# Patient Record
Sex: Female | Born: 1970 | Race: White | Hispanic: No | Marital: Married | Smoking: Former smoker
Health system: Southern US, Community
[De-identification: ages and names within clinical notes are randomized; demographics above are authoritative.]

## PROBLEM LIST (undated history)

## (undated) DIAGNOSIS — Z9889 Other specified postprocedural states: Secondary | ICD-10-CM

## (undated) DIAGNOSIS — L900 Lichen sclerosus et atrophicus: Secondary | ICD-10-CM

## (undated) DIAGNOSIS — F329 Major depressive disorder, single episode, unspecified: Secondary | ICD-10-CM

## (undated) DIAGNOSIS — K589 Irritable bowel syndrome without diarrhea: Secondary | ICD-10-CM

## (undated) DIAGNOSIS — F419 Anxiety disorder, unspecified: Secondary | ICD-10-CM

## (undated) DIAGNOSIS — F32A Depression, unspecified: Secondary | ICD-10-CM

## (undated) DIAGNOSIS — K219 Gastro-esophageal reflux disease without esophagitis: Secondary | ICD-10-CM

## (undated) HISTORY — DX: Lichen sclerosus et atrophicus: L90.0

## (undated) HISTORY — DX: Anxiety disorder, unspecified: F41.9

## (undated) HISTORY — PX: DILATION AND CURETTAGE OF UTERUS: SHX78

## (undated) HISTORY — DX: Irritable bowel syndrome, unspecified: K58.9

## (undated) HISTORY — DX: Major depressive disorder, single episode, unspecified: F32.9

## (undated) HISTORY — DX: Depression, unspecified: F32.A

---
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
## 1998-04-02 ENCOUNTER — Other Ambulatory Visit: Admission: RE | Admit: 1998-04-02 | Discharge: 1998-04-02 | Payer: Self-pay | Admitting: Gynecology

## 1999-04-28 ENCOUNTER — Other Ambulatory Visit: Admission: RE | Admit: 1999-04-28 | Discharge: 1999-04-28 | Payer: Self-pay | Admitting: Gynecology

## 2001-08-24 ENCOUNTER — Encounter: Payer: Self-pay | Admitting: Obstetrics and Gynecology

## 2001-08-24 ENCOUNTER — Inpatient Hospital Stay (HOSPITAL_COMMUNITY): Admission: AD | Admit: 2001-08-24 | Discharge: 2001-08-24 | Payer: Self-pay | Admitting: Obstetrics and Gynecology

## 2001-09-18 ENCOUNTER — Other Ambulatory Visit: Admission: RE | Admit: 2001-09-18 | Discharge: 2001-09-18 | Payer: Self-pay | Admitting: Obstetrics and Gynecology

## 2002-03-26 ENCOUNTER — Encounter (INDEPENDENT_AMBULATORY_CARE_PROVIDER_SITE_OTHER): Payer: Self-pay | Admitting: *Deleted

## 2002-03-26 ENCOUNTER — Inpatient Hospital Stay (HOSPITAL_COMMUNITY): Admission: AD | Admit: 2002-03-26 | Discharge: 2002-03-29 | Payer: Self-pay | Admitting: Obstetrics and Gynecology

## 2002-03-26 ENCOUNTER — Encounter: Payer: Self-pay | Admitting: Obstetrics and Gynecology

## 2002-03-30 ENCOUNTER — Encounter: Admission: RE | Admit: 2002-03-30 | Discharge: 2002-04-29 | Payer: Self-pay | Admitting: Obstetrics and Gynecology

## 2002-04-30 ENCOUNTER — Encounter: Admission: RE | Admit: 2002-04-30 | Discharge: 2002-05-30 | Payer: Self-pay | Admitting: Obstetrics and Gynecology

## 2002-05-03 ENCOUNTER — Other Ambulatory Visit: Admission: RE | Admit: 2002-05-03 | Discharge: 2002-05-03 | Payer: Self-pay | Admitting: Obstetrics and Gynecology

## 2003-05-09 ENCOUNTER — Other Ambulatory Visit: Admission: RE | Admit: 2003-05-09 | Discharge: 2003-05-09 | Payer: Self-pay | Admitting: Obstetrics and Gynecology

## 2004-11-02 ENCOUNTER — Other Ambulatory Visit: Admission: RE | Admit: 2004-11-02 | Discharge: 2004-11-02 | Payer: Self-pay | Admitting: Obstetrics and Gynecology

## 2006-09-14 ENCOUNTER — Encounter: Admission: RE | Admit: 2006-09-14 | Discharge: 2006-09-14 | Payer: Self-pay | Admitting: Family Medicine

## 2006-09-23 ENCOUNTER — Encounter: Admission: RE | Admit: 2006-09-23 | Discharge: 2006-09-23 | Payer: Self-pay | Admitting: Family Medicine

## 2008-07-11 ENCOUNTER — Other Ambulatory Visit: Admission: RE | Admit: 2008-07-11 | Discharge: 2008-07-11 | Payer: Self-pay | Admitting: Obstetrics & Gynecology

## 2008-10-11 ENCOUNTER — Ambulatory Visit (HOSPITAL_COMMUNITY): Admission: RE | Admit: 2008-10-11 | Discharge: 2008-10-11 | Payer: Self-pay | Admitting: Obstetrics & Gynecology

## 2008-10-11 ENCOUNTER — Encounter: Payer: Self-pay | Admitting: Obstetrics & Gynecology

## 2009-01-17 ENCOUNTER — Encounter: Admission: RE | Admit: 2009-01-17 | Discharge: 2009-01-17 | Payer: Self-pay | Admitting: Obstetrics & Gynecology

## 2009-01-17 IMAGING — MG MM SCREEN MAMMOGRAM BILATERAL
4 series · 4 of 4 positions shown · non-contrast
Comparison: none

DG SCREEN MAMMOGRAM BILATERAL
Bilateral CC and MLO view(s) were taken.

DIGITAL SCREENING MAMMOGRAM WITH CAD:
The breast tissue is heterogeneously dense.  No masses or malignant type calcifications are 
identified.  Compared with prior studies.
Images were processed with CAD.

[R CC]
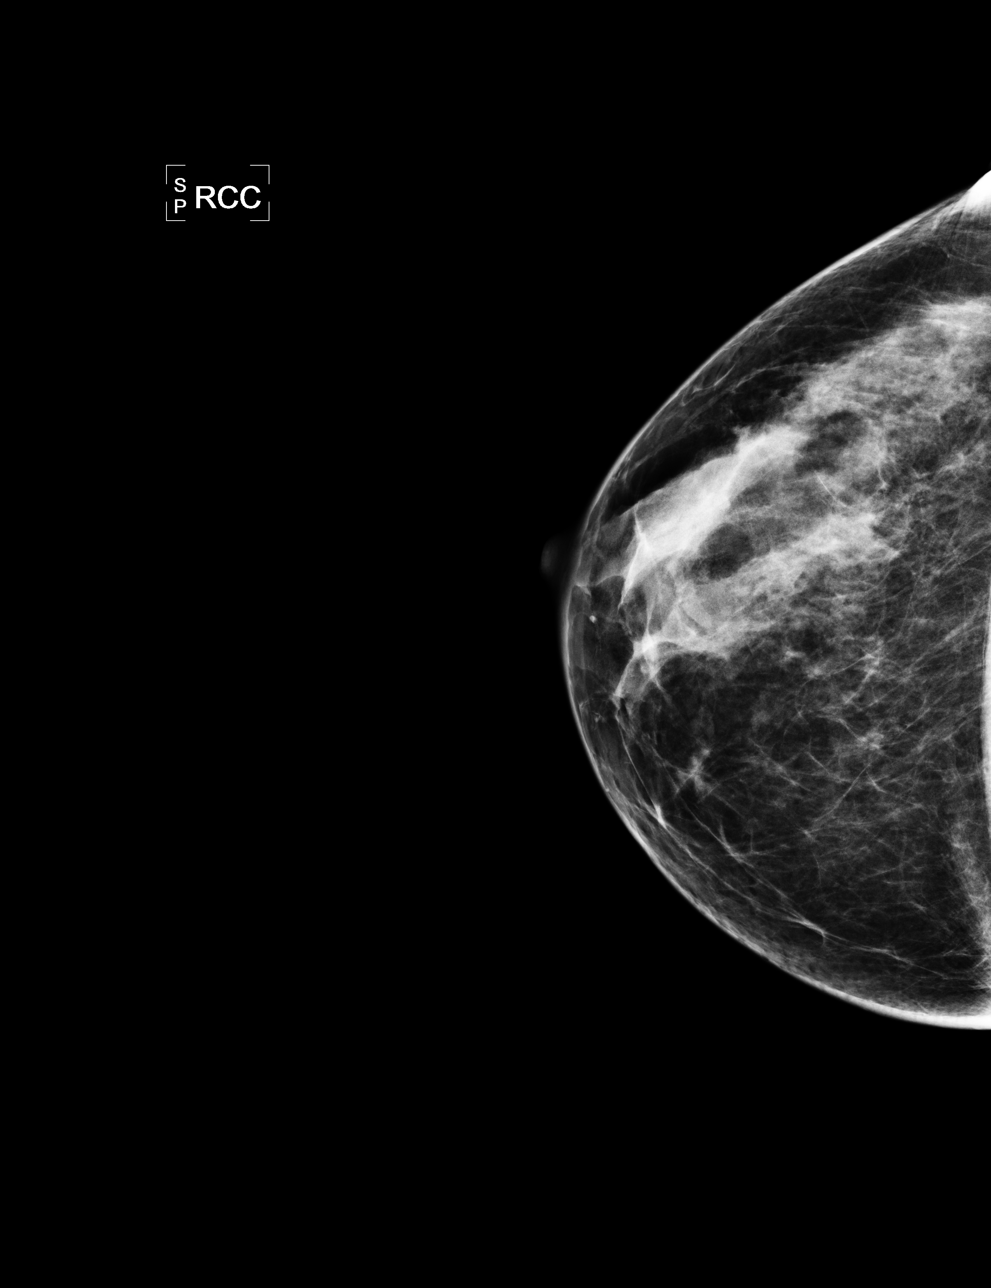

[L CC]
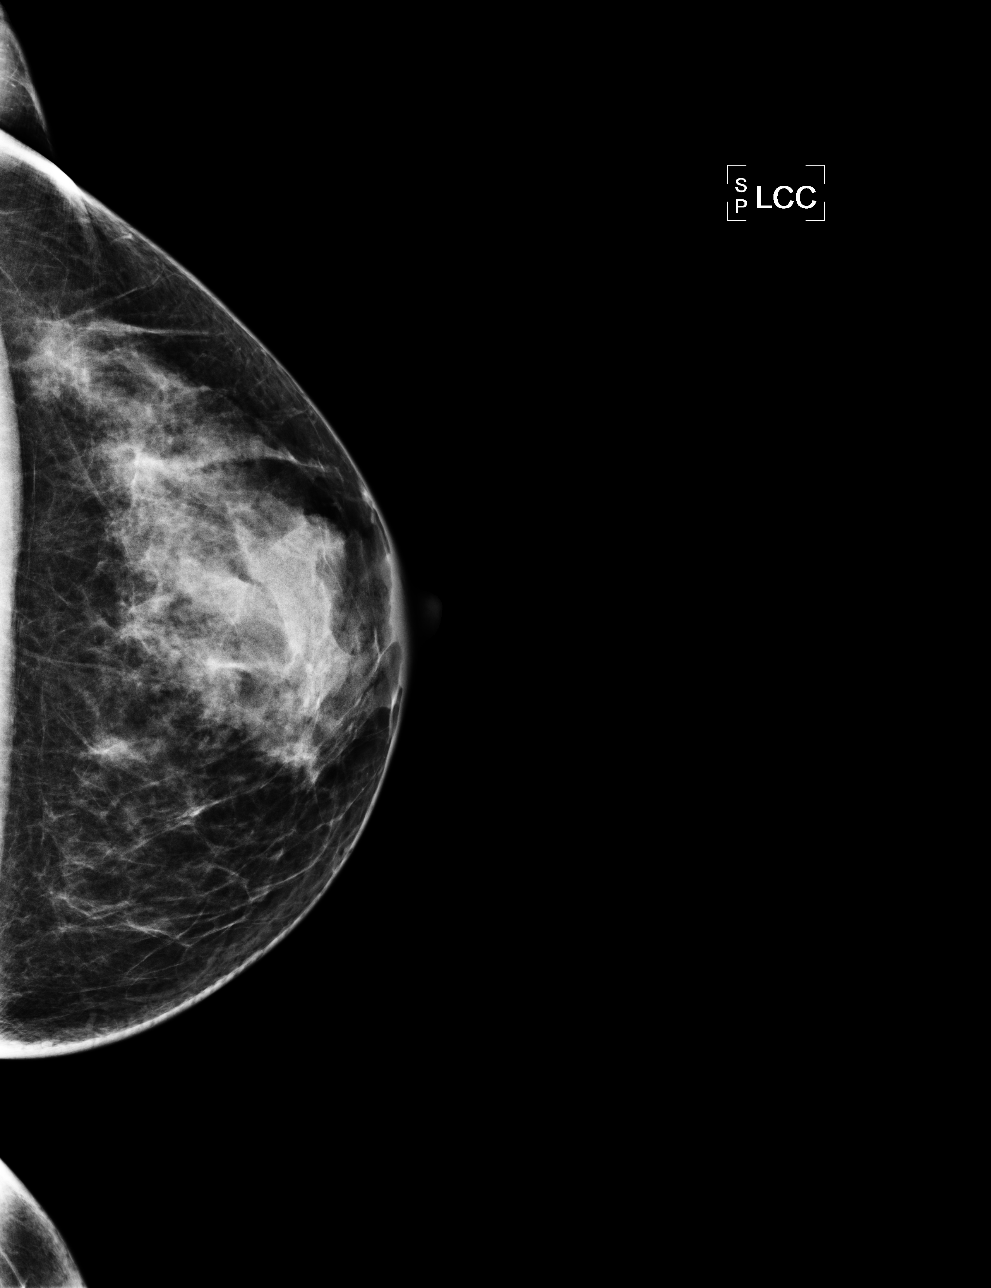

[L MLO]
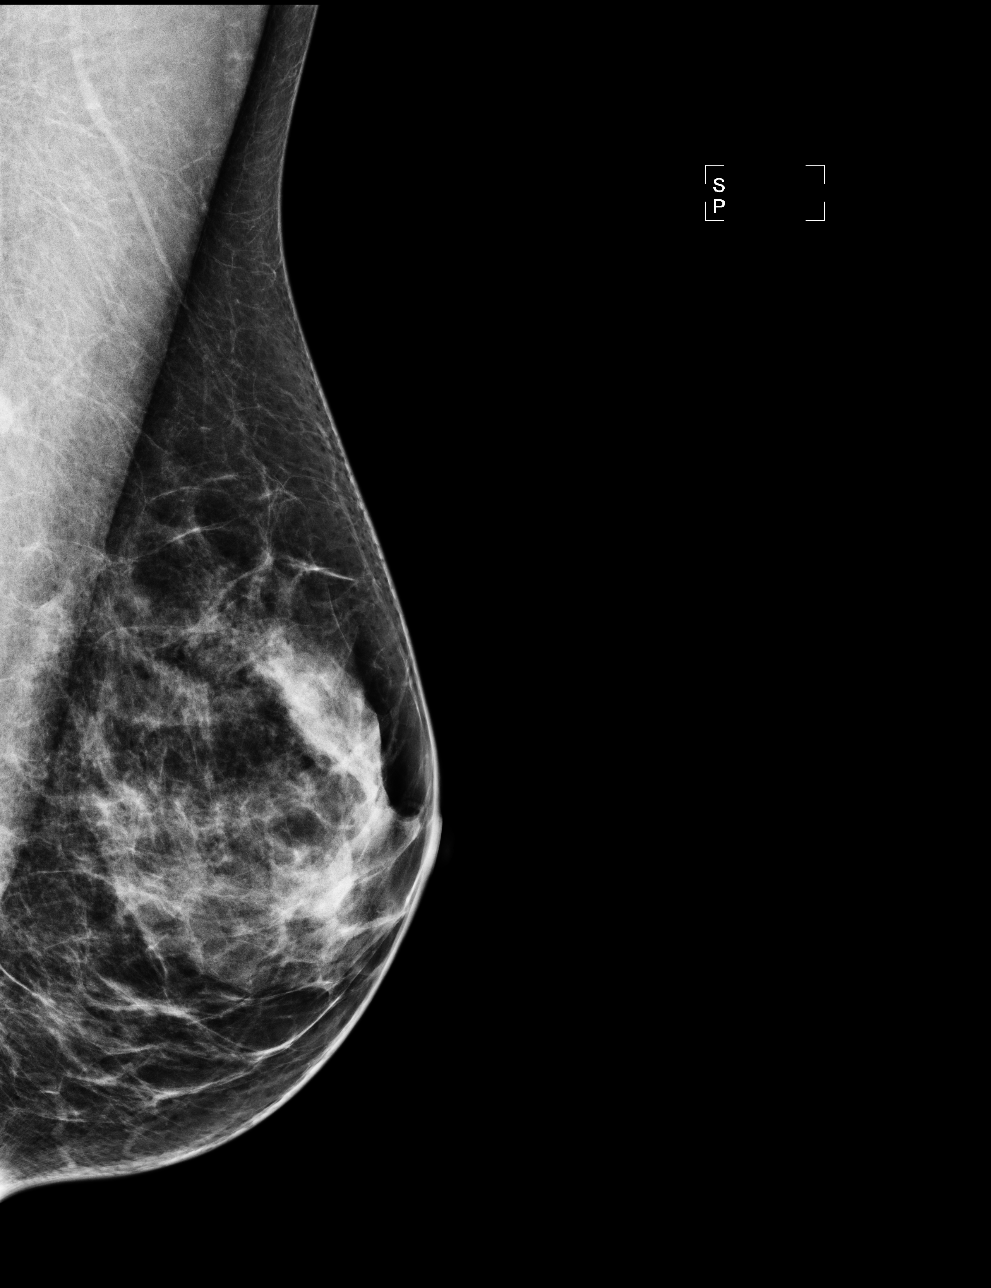

[R MLO]
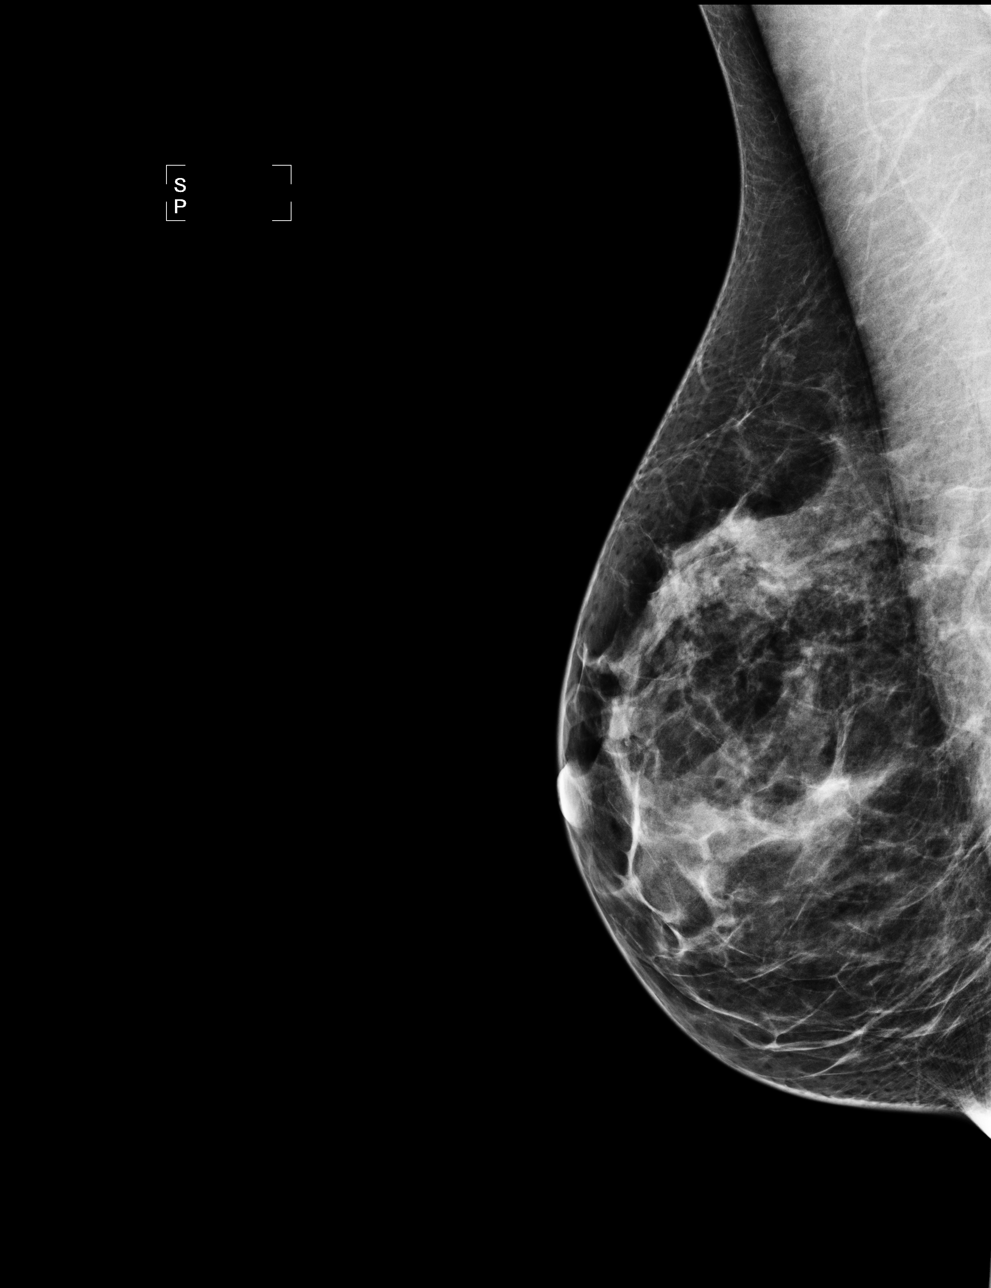

[4 of 4 positions shown; findings below may reference images not displayed]

IMPRESSION: No specific mammographic evidence of malignancy.  Next screening mammogram is recommended in one 
year.

A result letter of this screening mammogram will be mailed directly to the patient.

ASSESSMENT: Benign - BI-RADS 2

Screening mammogram in 1 year.
,

## 2010-01-28 ENCOUNTER — Encounter: Admission: RE | Admit: 2010-01-28 | Discharge: 2010-01-28 | Payer: Self-pay | Admitting: Obstetrics & Gynecology

## 2010-04-17 ENCOUNTER — Ambulatory Visit: Payer: Self-pay | Admitting: Family Medicine

## 2010-04-17 DIAGNOSIS — R319 Hematuria, unspecified: Secondary | ICD-10-CM | POA: Insufficient documentation

## 2010-04-17 DIAGNOSIS — R6881 Early satiety: Secondary | ICD-10-CM | POA: Insufficient documentation

## 2010-04-17 DIAGNOSIS — R143 Flatulence: Secondary | ICD-10-CM

## 2010-04-17 DIAGNOSIS — R141 Gas pain: Secondary | ICD-10-CM | POA: Insufficient documentation

## 2010-04-17 DIAGNOSIS — R142 Eructation: Secondary | ICD-10-CM

## 2010-04-17 DIAGNOSIS — F339 Major depressive disorder, recurrent, unspecified: Secondary | ICD-10-CM | POA: Insufficient documentation

## 2010-04-17 LAB — CONVERTED CEMR LAB
Ketones, urine, test strip: NEGATIVE
Nitrite: NEGATIVE
Protein, U semiquant: NEGATIVE
Urobilinogen, UA: 0.2

## 2010-04-20 LAB — CONVERTED CEMR LAB
ALT: 17 units/L (ref 0–35)
AST: 21 units/L (ref 0–37)
Alkaline Phosphatase: 53 units/L (ref 39–117)
BUN: 13 mg/dL (ref 6–23)
Basophils Absolute: 0 10*3/uL (ref 0.0–0.1)
Bilirubin, Direct: 0.2 mg/dL (ref 0.0–0.3)
CA 125: 8 units/mL (ref 0.0–30.2)
Creatinine, Ser: 0.8 mg/dL (ref 0.4–1.2)
Eosinophils Relative: 1.6 % (ref 0.0–5.0)
GFR calc non Af Amer: 87.51 mL/min (ref >60)
Glucose, Bld: 74 mg/dL (ref 70–99)
Monocytes Absolute: 0.5 10*3/uL (ref 0.1–1.0)
Monocytes Relative: 6.8 % (ref 3.0–12.0)
Neutrophils Relative %: 65.3 % (ref 43.0–77.0)
Platelets: 184 10*3/uL (ref 150.0–400.0)
RDW: 12.9 % (ref 11.5–14.6)
Total Bilirubin: 1.2 mg/dL (ref 0.3–1.2)
Total Protein: 6.7 g/dL (ref 6.0–8.3)
WBC: 6.8 10*3/uL (ref 4.5–10.5)

## 2010-05-07 ENCOUNTER — Ambulatory Visit: Payer: Self-pay | Admitting: Family Medicine

## 2010-05-07 DIAGNOSIS — K649 Unspecified hemorrhoids: Secondary | ICD-10-CM | POA: Insufficient documentation

## 2010-06-23 ENCOUNTER — Other Ambulatory Visit: Payer: Self-pay | Admitting: Dermatology

## 2010-07-02 NOTE — Assessment & Plan Note (Signed)
Summary: new to estab/cbs   Vital Signs:  Patient profile:   40 year old female Height:      63.25 inches Weight:      126 pounds BMI:     22.22 Pulse rate:   108 / minute BP sitting:   110 / 66  (left arm)  Vitals Entered By: Doristine Devoid CMA (April 17, 2010 10:32 AM) CC: NEW EST- urinary urgency was seen at [facility] for same issue and was found to have trace of blood in urine but no other recommendations where made    History of Present Illness: 40 yo woman here today to establish care.  previous PCP- [facility].  GYNHyacinth Meeker.  hematuria- UA had trace blood 1 month ago but urine cx was (-).  at the time was having flank pain and was told she had a lung infxn and started on abx.  2 weeks later was told it was not lung infxn.  still having urinary frequency, urgency, and hesitancy.  no dysuria.  large caffeine intake.  early satiety.  sxs started 2-3 months ago.  + gas and bloating.  most often occurs at night.  no N/V.  no diarrhea.  reports normal BMs.  regular menstrual cycles.  reports increased vaginal discharge- 'egg white' appearing.  no odor or itching/burning.  Preventive Screening-Counseling & Management  Alcohol-Tobacco     Smoking Status: quit < 6 months     Year Quit: 2011  Current Medications (verified): 1)  Wellbutrin Xl 300 Mg Xr24h-Tab (Bupropion Hcl) .... 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  recently quit.Smoking Status:  quit < 6 months  Review of Systems      See HPI  Physical Exam  General:  Well-developed,well-nourished,in no acute distress; alert,appropriate and cooperative throughout examination Head:  Normocephalic and  atraumatic without obvious abnormalities. No apparent alopecia or balding. Neck:  No deformities, masses, or tenderness noted. Lungs:  Normal respiratory effort, chest expands symmetrically. Lungs are clear to auscultation, no crackles or wheezes. Heart:  Normal rate and regular rhythm. S1 and S2 normal without gallop, murmur, click, rub or other extra sounds. Abdomen:  Bowel sounds positive,abdomen soft and non-tender without masses, organomegaly or hernias noted. Pulses:  +2 carotid, radial, DP Extremities:  no C/C/E Psych:  mildly anxious   Impression & Recommendations:  Problem # 1:  HEMATURIA UNSPECIFIED (ICD-599.70) Assessment New pt again w/ trace blood in urine.  send for cx but hold on abx given lack of other abnormalities.  if no infxn present will refer to urology. Orders: UA Dipstick w/o Micro (manual) (16109) Specimen Handling (99000) T-Culture, Urine ([id number])  Problem # 2:  GAS/BLOATING (ICD-787.3) Assessment: New check H pylori to r/o ulcer.  add PPI (omeprazole).  may be stress related as sxs coincided w/ loss of her father.  will follow. Orders: TLB-H. Pylori Abs(Helicobacter Pylori) (86677-HELICO) TLB-Hepatic/Liver Function Pnl (80076-HEPATIC) Specimen Handling (91478)  Problem # 3:  EARLY SATIETY (ICD-780.94) Assessment: New again may be stress related but will check CA-125 to r/o possible GYN cause of sxs.  CBC to r/o infxn.  will follow closely. Orders: TLB-BMP (Basic Metabolic Panel-BMET) (80048-METABOL) TLB-CBC Platelet - w/Differential (85025-CBCD) T-CA 125 (29562-13086)  Complete Medication List:  1)  Wellbutrin Xl 300 Mg Xr24h-tab (Bupropion hcl) .... Take one tablet daily  Patient Instructions: 1)  Follow up in 3-4 weeks to discuss gas/bloating- sooner if needed 2)  We'll notify you of your lab results 3)  Start Prilosec (Omeprazole) daily for possible acid reflux 4)  Increase your water intake- goal is 1 bottle in the morning and 1 in the  afternoon 5)  If no infection in your urine will refer to urology for complete evaluation 6)  Call with any questions or concerns 7)  Hang in there! 8)  Welcome!  We're glad to have you! 9)  Happy Holidays!   Orders Added: 1)  UA Dipstick w/o Micro (manual) [81002] 2)  Specimen Handling [99000] 3)  T-Culture, Urine [[id number]] 4)  TLB-H. Pylori Abs(Helicobacter Pylori) [86677-HELICO] 5)  TLB-Hepatic/Liver Function Pnl [80076-HEPATIC] 6)  TLB-BMP (Basic Metabolic Panel-BMET) [80048-METABOL] 7)  TLB-CBC Platelet - w/Differential [85025-CBCD] 8)  T-CA 125 [86304-23750] 9)  Specimen Handling [99000] 10)  New Patient Level III [61950]    Laboratory Results   Urine Tests    Routine Urinalysis   Glucose: negative   (Normal Range: Negative) Bilirubin: negative   (Normal Range: Negative) Ketone: negative   (Normal Range: Negative) Spec. Gravity: >=1.030   (Normal Range: 1.003-1.035) Blood: moderate   (Normal Range: Negative) pH: 6.0   (Normal Range: 5.0-8.0) Protein: negative   (Normal Range: Negative) Urobilinogen: 0.2   (Normal Range: 0-1) Nitrite: negative   (Normal Range: Negative) Leukocyte Esterace: negative   (Normal Range: Negative)         Preventive Care Screening  Mammogram:    Date:  12/29/2009    Results:  normal   Pap Smear:    Date:  10/29/2009    Results:  normal

## 2010-07-02 NOTE — Assessment & Plan Note (Signed)
Summary: followup from few weeks ago///sph   Vital Signs:  Patient profile:   40 year old female Weight:      126 pounds Pulse rate:   84 / minute BP sitting:   114 / 70  (left arm)  Vitals Entered By: Doristine Devoid CMA (May 07, 2010 1:06 PM) CC: f/u up on med and blood in stool    History of Present Illness: 40 yo woman here today for f/u on  1) depression- feels mood has improved.  taking Welbutrin daily.  having some difficulty w/ approaching holidays.  still has a lot of stress in life w/ work, kids, home, family.  2) gas/bloating- sxs have improved since limiting caffeine intake and increasing water.  prilosec also helping.  no abd pain.  3) blood in stool- was straining to have BM, had large amount of BRB.  2 episodes yesterday and 1 today.  pain w/ BMs.  no hx of hemorrhoids.  started stool softener last night.  stools are not dark or tarry, no blood mixed through stool.  Current Medications (verified): 1)  Wellbutrin Xl 300 Mg Xr24h-Tab (Bupropion Hcl) .... Take One Tablet Daily  Allergies (verified): No Known Drug Allergies  Past History:  Past medical, surgical, family and social histories (including risk factors) reviewed for relevance to current acute and chronic problems.  Past Medical History: Reviewed history from 04/17/2010 and no changes required. Depression  Past Surgical History: Reviewed history from 04/17/2010 and no changes required. Caesarean section Dilation and currettage  Family History: Reviewed history from 04/17/2010 and no changes required. CAD-no HTN-no DM-no STROKE-father (died 02-22-2023) COLON CA-no BREAST CA-no  Social History: Reviewed history from 04/17/2010 and no changes required. 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  Review of Systems      See HPI  Physical Exam  General:  Well-developed,well-nourished,in no acute distress; alert,appropriate and cooperative throughout  examination Lungs:  Normal respiratory effort, chest expands symmetrically. Lungs are clear to auscultation, no crackles or wheezes. Heart:  Normal rate and regular rhythm. S1 and S2 normal without gallop, murmur, click, rub or other extra sounds. Abdomen:  Bowel sounds positive,abdomen soft and non-tender without masses, organomegaly or hernias noted. Rectal:  no external abnormalities, normal sphincter tone, and mildly tender, small internal hemorrhoid.  + gross blood     Impression & Recommendations:  Problem # 1:  DEPRESSION (ICD-311) Assessment Improved pt feels mood is improving w/ continued use of Welbutrin.  still having difficulty at time accepting dad is not here for holidays but reports 'overall, i'm much better'. Her updated medication list for this problem includes:    Wellbutrin Xl 300 Mg Xr24h-tab (Bupropion hcl) .Marland Kitchen... Take one tablet daily  Problem # 2:  GAS/BLOATING (ICD-787.3) Assessment: Improved much better since increasing water intake, limiting caffeine, and starting PPI.  Problem # 3:  HEMORRHOIDS (ICD-455.6) Assessment: New most likely the cause of BRB.  continue stool softener, add fiber supplement.  reviewed supportive care and red flags that should prompt return.  Pt expresses understanding and is in agreement w/ this plan.  Complete Medication List: 1)  Wellbutrin Xl 300 Mg Xr24h-tab (Bupropion hcl) .... Take one tablet daily  Patient Instructions: 1)  Please schedule a complete physical at your convenience- do not eat before this appt 2)  The bleeding is due to a hemorrhoid 3)  Continue the Prilosec 4)  Keep up your water intake 5)  Stool softener daily  to prevent straining 6)  Increase fiber 7)  If the bleeding continues, please call and let us know 8)  Hang in there! 9)  Happy Holidays! Prescriptions: WELLBUTRIN XL 300 MG XR24H-TAB (BUPROPION HCL) take one tablet daily  #30 x 6   Entered and Authorized by:   Neena Rhymes MD   Signed by:    Neena Rhymes MD on 05/07/2010   Method used:   Electronically to        [facility] # [phone]* (retail)       [address]       [place], [place]  [address]       Ph: [phone]       Fax: [phone]   RxID:   [id number]    Orders Added: 1)  Est. Patient Level IV [27253]

## 2010-09-08 LAB — CBC
HCT: 38.7 % (ref 36.0–46.0)
Hemoglobin: 13.6 g/dL (ref 12.0–15.0)
Platelets: 214 10*3/uL (ref 150–400)
RDW: 13 % (ref 11.5–15.5)
WBC: 7.1 10*3/uL (ref 4.0–10.5)

## 2010-09-08 LAB — URINALYSIS, ROUTINE W REFLEX MICROSCOPIC
Bilirubin Urine: NEGATIVE
Nitrite: NEGATIVE
Specific Gravity, Urine: 1.015 (ref 1.005–1.030)
Urobilinogen, UA: 0.2 mg/dL (ref 0.0–1.0)
pH: 6.5 (ref 5.0–8.0)

## 2010-09-08 LAB — URINE MICROSCOPIC-ADD ON

## 2010-09-08 LAB — ABO/RH: ABO/RH(D): A POS

## 2010-10-13 NOTE — Op Note (Signed)
NAME:  [name] [name]               ACCOUNT NO.:  [id number]   MEDICAL RECORD NO.:  [id number]          PATIENT TYPE:  AMB   LOCATION:  SDC                           FACILITY:  [facility]   PHYSICIAN:  M. [name] Quail, MD  DATE OF BIRTH:  [birth year]   DATE OF PROCEDURE:  DATE OF DISCHARGE:  10/11/2008                               OPERATIVE REPORT   PREOPERATIVE DIAGNOSES:  31. A 40 year old G8, P 1-8-2 married white female with missed abortion      of twins.  2. Maternal blood type A+.   POSTOPERATIVE DIAGNOSES:  75. A 40 year old G38, P 1-8-2 married white female with missed abortion      of twins.  2. Maternal blood type A+.   PROCEDURE:  Suction, dilatation and curettage.   SURGEON:  M. [name] Quail, MD   ASSISTANT:  OR staff.   ANESTHESIA:  MAC, Dr. Jean Rosenthal oversaw the case.   FINDINGS:  A 8-week size uterus.   SPECIMENS:  Products of conception sent to Pathology.   ESTIMATED BLOOD LOSS:  100 mL.   FLUIDS:  500 mL of LR.   URINE OUTPUT:  100 mL of clear urine drained with I and O  catheterization for remainder of the procedure.   COMPLICATIONS:  None.   INDICATIONS:  [name] [name] is a very nice 40 year old G31, P 1-8-2,  married white female who initially presented on September 13, 2008, with a  positive urine pregnancy test which is confirmed in the office.  LMP was  August 04, 2008, making the patient at 5 weeks and 4/7th days by LMP.  She  was already on prenatal vitamins, precautions were given.  She was seen  back in our office for viability ultrasound which was done on May 10.  At this point, the babies were already lagging in growth and baby A was  measuring at 6 and 4/7 weeks and baby B at 6 and 0/7 weeks, both heart  rates of under 100.  I discussed the findings with the patient and her  husband and my concern is for possibility of miscarriage.  However, at  that time, I did not feel comfortable proceeding with any intervention  as both babies still did have heart  rates, although below.  She returned  to our office 1 week later, which was this past Wednesday, and there  were no heart rates on either baby.  She and her husband had mentally  prepared themselves for this and she is 52 and actively tried to get  pregnant, wants to proceed with intervention, to not waist any time.  Risks and benefits of this decision were explained to the patient and  they are documented in our office chart.  She wanted the procedure  scheduled on Friday if possible and we were able to accommodate this for  her.   PROCEDURE IN DETAIL:  Informed consent was present on the chart.  The  patient was taken to the operating room.  She was placed in supine  position.  Anesthesia was administered by the anesthesia staff without  difficulty.  Legs were  positioned in the low lithotomy position.  SCDs  were on her lower extremities bilaterally.  A bivalve speculum was  placed in the vagina.  The anterior lip of the cervix was grasped with  single-tooth tenaculum.  A paracervical block with 1% lidocaine was  instilled in the cervix, 10 mL total were used.  Uterus sounds to 9.5  cm.  A #9 curved suction tip was obtained.  The cervix was dilated up to  #27 [facility] dilator.  A #9 curved tip was passed through the endometrial  cavity to the fundus of the uterus.  The suction was applied.  Grayish  tissue was seen in the suction tip consistent with products of  conception.  Once there was no additional tissue in the suction tip, the  suction was stopped and the tip was removed.  A #1 smooth curette was  obtained.  Endometrial cavity was curetted till rough gritty texture was  noted in all quadrants.  There was no additional tissue that was  obtained.  One additional pass of the suction tip was obtained and only  small amount of blood was noted.  At this point, this portion of the  procedure was ended.  The tenaculum was removed from the cervix.  All  instruments were removed from the  vagina.  The patient tolerated the  procedure well.  The Betadine prep was cleansed off her skin and her  legs were positioned back in the supine position.   Sponge, lap, needle, and instruments counts were correct x2.  The  patient was awakened from anesthesia and she was taken to the recovery  room in stable addition.      Lum Keas, MD  Electronically Signed     MSM/MEDQ  D:  10/11/2008  T:  10/12/2008  Job:  [id number]

## 2010-10-16 NOTE — Op Note (Signed)
NAME:  [name] [name], [name] [name]                         ACCOUNT NO.:  [id number]   MEDICAL RECORD NO.:  [id number]                   PATIENT TYPE:  INP   LOCATION:  9168                                 FACILITY:  [facility]   PHYSICIAN:  Tracie Harrier, M.D.              DATE OF BIRTH:  1970-07-18   DATE OF PROCEDURE:  03/26/2002  DATE OF DISCHARGE:                                 OPERATIVE REPORT   PREOPERATIVE DIAGNOSES:  1. Intrauterine pregnancy at 37 weeks.  2. Vaginal bleeding consistent with placental abruption.  3. Nonreassuring fetal heart tracing.   POSTOPERATIVE DIAGNOSES:  1. Intrauterine pregnancy at 37 weeks.  2. Vaginal bleeding consistent with placental abruption.  3. Nonreassuring fetal heart tracing.   PROCEDURE:  Primary low-transverse cesarean section.   SURGEON:  Tracie Harrier, M.D.   ANESTHESIA:  Spinal.   ESTIMATED BLOOD LOSS:  800 cc.   COMPLICATIONS:  None.   FINDINGS:  At 1315, through a low-transverse uterine incision, a viable female  infant was delivered from the vertex presentation.  Apgars are 8 and 9.  Baby's weight is pending.  The baby was somewhat small, however.  Cord pH  7.36.  The pelvis was visualized at time of surgery and noted to be normal.   PROCEDURE:  The patient was taken to the operating room where a spinal  anesthetic was administered.  The patient was placed on the operating table  in the left lateral tilt position.  The abdomen was prepped and draped in  the usual sterile fashion with Betadine and sterile drapes.  A Foley  catheter was sterilely inserted.   The abdomen was entered through a Pfannenstiel incision and carried down  sharply in the usual fashion.  The peritoneum was atraumatically entered.  The vesicouterine peritoneum overlying the lower uterine segment was incised  and a bladder flap was bluntly and sharply created over the lower uterine  segment.  A bladder blade was then placed behind the bladder flap.  The  uterus  was then entered through a low-transverse incision and carried out  laterally using the operator's fingers.  The membranes were entered with  really scant fluid noted.  The vertex was elevated into the incision and  delivered promptly and easily at 1315.  The oral and nasopharynx was  thoroughly bulb suctioned and the cord doubly clamped and cut.  The baby was  handed promptly to the pediatricians.  The baby was a female delivered at 1:15  p.m.  Weight is pending.  Apgars were 8 and 9.  A cord pH was obtained which  showed a pH of 7.36.   The placenta was then manually extracted intact with three-vessel cord  without difficulty.  There was a questionable placental abruption.  Due to  the baby's size, this was sent for pathological diagnosis.   The uterus was then wiped clean with a wet sponge.  The anterior  of the  uterus was normal.  The uterine incision was closed in a two layer fashion,  the first layer a running interlocking suture of #1 Vicryl, a second  imbricating suture was placed across the primary suture line with a running  suture of #1 Vicryl as well.  The pelvis was thoroughly irrigated and noted  to be normal and hemostatic.   The rectus muscle and anterior peritoneum were then closed with a running  suture of #1 Vicryl.  The subfascial areas were hemostatic.  The fascia was  then closed with one suture of #1 Vicryl in a running fashion.  The  subcutaneous tissue was irrigated and made hemostatic using Bovie cautery,  the skin reapproximated with staples, and a sterile dressing applied.   Final sponge, needle, and instrument count was correct x3.  There were no  perioperative complications.  The patient did receive 1 g of Cefotan IV  after cord clamp.                                               Tracie Harrier, M.D.    REG/MEDQ  D:  03/26/2002  T:  03/26/2002  Job:  [id number]

## 2010-10-16 NOTE — Discharge Summary (Signed)
NAME:  [name] [name], [name] [name]                         ACCOUNT NO.:  [id number]   MEDICAL RECORD NO.:  [id number]                   PATIENT TYPE:  INP   LOCATION:  9106                                 [facility]ACILITY:  [facility]   PHYSICIAN:  Duke Salvia. Marcelle Overlie, M.D.            DATE OF BIRTH:  1970-07-12   DATE OF ADMISSION:  03/26/2002  DATE OF DISCHARGE:  03/29/2002                                 DISCHARGE SUMMARY   ADMISSION DIAGNOSES:  1. Intrauterine pregnancy at 56 weeks' estimated gestational age.  2. Spontaneous rupture of membranes.  3. Early labor.  4. Placental abruption.   DISCHARGE DIAGNOSES:  1. Status post low transverse cesarean section.  2. Viable female infant.   PROCEDURE:  Low transverse cesarean section.   REASON FOR ADMISSION:  Please see written H&P.   HOSPITAL COURSE:  The patient is a 40 year old gravida 1, para 0 that  presented to [facility] at term with a gush of fluid and  blood.  Contractions were noted to be irregular.  Fetal heart tones were  reactive.  Pitocin was started to augment labor.  Vaginal bleeding increased  and fetal heart tones were noted with deceleration pattern and an increased  in baseline fetal heart rate.  Decision was made to proceed with low  transverse cesarean delivery.  The patient was taken to the operating room  where spinal anesthesia was administered without difficulty.  A low  transverse incision was made with the delivery of a viable female weighing 4  pounds 10 ounces with Apgars of 8 at one minute and 9 at five minutes.  Umbilical cord pH was 7.36.  The placenta was manually extracted.  There was  a questionable placental abruption.  The placenta was sent to pathology.  The patient did tolerate the procedure well and was taken to the recovery  room in stable condition.  On postoperative day #1, the patient had good  return of bowel function.  Abdominal dressing was noted to have a small  amount of drainage on bandage.   Labs revealed hemoglobin of 10.5, platelet  count of 167,000, WBC 10.7.  IV was reduced to a Hep-Lock, and diet was  advanced.  On postoperative day #2, abdomen was soft.  Abdominal dressing  was partially removed which revealed an incision that was clean, dry and  intact.  The patient was tolerating a regular diet without nausea and  vomiting.  On postoperative day #3, the abdomen was soft.  The incision was  clean and dry intact.  Staples were removed, and the patient was discharged  home.   DISCHARGE CONDITION:  Good.   DIET:  Regular as tolerated.   ACTIVITY:  No heavy lifting, no driving x 2 weeks.  No vaginal entry.   FOLLOWUP:  The patient is to follow up in the office in one to two weeks for  an incision check.  She is to call  for temperature greater than 100 degrees,  persistent nausea and vomiting, heavy vaginal bleeding, and redness or  drainage from the incision site.    DISCHARGE MEDICATIONS:  1. Percocet 5/325 mg #30 one p.o. every four to six hours p.r.n. pain.  2. Motrin 600 mg every six hours p.r.n.  3. Prenatal vitamins one p.o. daily.  4. Colace one daily p.o. p.r.n.     Julio Sicks, N.P.                        Richard M. Marcelle Overlie, M.D.    CC/MEDQ  D:  04/22/2002  T:  04/22/2002  Job:  [id number]

## 2011-01-04 ENCOUNTER — Encounter: Payer: Self-pay | Admitting: Family Medicine

## 2011-01-05 ENCOUNTER — Encounter: Payer: Self-pay | Admitting: Family Medicine

## 2011-01-05 ENCOUNTER — Ambulatory Visit (INDEPENDENT_AMBULATORY_CARE_PROVIDER_SITE_OTHER): Payer: 59 | Admitting: Family Medicine

## 2011-01-05 DIAGNOSIS — F3289 Other specified depressive episodes: Secondary | ICD-10-CM

## 2011-01-05 DIAGNOSIS — F17200 Nicotine dependence, unspecified, uncomplicated: Secondary | ICD-10-CM

## 2011-01-05 DIAGNOSIS — F329 Major depressive disorder, single episode, unspecified: Secondary | ICD-10-CM

## 2011-01-05 MED ORDER — BUPROPION HCL ER (XL) 300 MG PO TB24: 300.0000 mg | ORAL_TABLET | Freq: Every day | ORAL | Status: DC
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
## 2011-01-05 NOTE — Patient Instructions (Signed)
Schedule your complete physical in 6 weeks- do not eat before this appt Restart the Wellbutrin daily Good luck on quitting smoking- you can do this! Call with any questions or concerns Hang in there!

## 2011-01-10 DIAGNOSIS — F17200 Nicotine dependence, unspecified, uncomplicated: Secondary | ICD-10-CM | POA: Insufficient documentation

## 2011-01-10 DIAGNOSIS — Z87891 Personal history of nicotine dependence: Secondary | ICD-10-CM | POA: Insufficient documentation

## 2011-01-10 NOTE — Assessment & Plan Note (Signed)
Will restart Wellbutrin as pt has multiple stressors currently and would also like to use it as a smoking cessation aide.  Will follow closely.

## 2011-01-10 NOTE — Assessment & Plan Note (Signed)
Pt reports she is ready to quit.  Will use wellbutrin to assist.  Applauded pt's decision.  Will follow.

## 2011-02-15 ENCOUNTER — Other Ambulatory Visit: Payer: Self-pay | Admitting: Obstetrics & Gynecology

## 2011-02-15 DIAGNOSIS — Z1231 Encounter for screening mammogram for malignant neoplasm of breast: Secondary | ICD-10-CM

## 2011-02-19 ENCOUNTER — Encounter: Payer: Self-pay | Admitting: Family Medicine

## 2011-02-19 ENCOUNTER — Ambulatory Visit (INDEPENDENT_AMBULATORY_CARE_PROVIDER_SITE_OTHER): Payer: 59 | Admitting: Family Medicine

## 2011-02-19 DIAGNOSIS — Z23 Encounter for immunization: Secondary | ICD-10-CM

## 2011-02-19 DIAGNOSIS — Z0000 Encounter for general adult medical examination without abnormal findings: Secondary | ICD-10-CM | POA: Insufficient documentation

## 2011-02-19 LAB — HEPATIC FUNCTION PANEL
ALT: 15 U/L (ref 0–35)
AST: 23 U/L (ref 0–37)
Bilirubin, Direct: 0.1 mg/dL (ref 0.0–0.3)
Total Bilirubin: 1 mg/dL (ref 0.3–1.2)
Total Protein: 7.3 g/dL (ref 6.0–8.3)

## 2011-02-19 LAB — CBC WITH DIFFERENTIAL/PLATELET
Basophils Absolute: 0 10*3/uL (ref 0.0–0.1)
Basophils Relative: 0.3 % (ref 0.0–3.0)
Hemoglobin: 13.9 g/dL (ref 12.0–15.0)
Lymphocytes Relative: 16 % (ref 12.0–46.0)
Monocytes Relative: 5.1 % (ref 3.0–12.0)
Neutro Abs: 7.6 10*3/uL (ref 1.4–7.7)
Neutrophils Relative %: 77.2 % — ABNORMAL HIGH (ref 43.0–77.0)
RBC: 4.23 Mil/uL (ref 3.87–5.11)
RDW: 12.5 % (ref 11.5–14.6)

## 2011-02-19 LAB — BASIC METABOLIC PANEL
CO2: 27 mEq/L (ref 19–32)
Chloride: 109 mEq/L (ref 96–112)
Creatinine, Ser: 0.6 mg/dL (ref 0.4–1.2)
Potassium: 4.3 mEq/L (ref 3.5–5.1)

## 2011-02-19 LAB — LIPID PANEL
Cholesterol: 182 mg/dL (ref 0–200)
LDL Cholesterol: 108 mg/dL — ABNORMAL HIGH (ref 0–99)
Total CHOL/HDL Ratio: 3
VLDL: 10.2 mg/dL (ref 0.0–40.0)

## 2011-02-19 NOTE — Progress Notes (Signed)
  Subjective:    Patient ID: [name] [name], female    DOB: 1971-03-21, 40 y.o.   MRN: [id number]  HPI CPE- GYN Dr Hyacinth Meeker.  UTD pap/mammo.    Review of Systems Patient reports no vision/ hearing changes, adenopathy,fever, weight change,  persistant/recurrent hoarseness , swallowing issues, chest pain, palpitations, edema, persistant/recurrent cough, hemoptysis, dyspnea (rest/exertional/paroxysmal nocturnal), gastrointestinal bleeding (melena, rectal bleeding), abdominal pain, significant heartburn, bowel changes, GU symptoms (dysuria, hematuria, incontinence), Gyn symptoms (abnormal  bleeding, pain),  syncope, focal weakness, memory loss, numbness & tingling, skin/hair/nail changes, abnormal bruising or bleeding.   + depression    Objective:   Physical Exam  General Appearance:    Alert, cooperative, no distress, appears stated age  Head:    Normocephalic, without obvious abnormality, atraumatic  Eyes:    PERRL, conjunctiva/corneas clear, EOM's intact, fundi    benign, both eyes  Ears:    Normal TM's and external ear canals, both ears  Nose:   Nares normal, septum midline, mucosa normal, no drainage    or sinus tenderness  Throat:   Lips, mucosa, and tongue normal; teeth and gums normal  Neck:   Supple, symmetrical, trachea midline, no adenopathy;    Thyroid: no enlargement/tenderness/nodules  Back:     Symmetric, no curvature, ROM normal, no CVA tenderness  Lungs:     Clear to auscultation bilaterally, respirations unlabored  Chest Wall:    No tenderness or deformity   Heart:    Regular rate and rhythm, S1 and S2 normal, no murmur, rub   or gallop  Breast Exam:    Deferred to GYN  Abdomen:     Soft, non-tender, bowel sounds active all four quadrants,    no masses, no organomegaly  Genitalia:    Deferred to GYN  Rectal:    Extremities:   Extremities normal, atraumatic, no cyanosis or edema  Pulses:   2+ and symmetric all extremities  Skin:   Skin color, texture, turgor normal, no  rashes or lesions  Lymph nodes:   Cervical, supraclavicular, and axillary nodes normal  Neurologic:   CNII-XII intact, normal strength, sensation and reflexes    throughout          Assessment & Plan:

## 2011-02-19 NOTE — Patient Instructions (Signed)
Follow up in 3 months to recheck mood Keep up the good work on diet and exercise!  You look great! We'll notify you of your lab results Call with any questions or concerns Happy Fall!!!

## 2011-02-21 NOTE — Assessment & Plan Note (Signed)
Pt's PE WNL.  Again encouraged smoking cessation.  Check labs.  Anticipatory guidance provided.

## 2011-02-22 ENCOUNTER — Telehealth: Payer: Self-pay

## 2011-02-22 NOTE — Progress Notes (Signed)
Quick Note:    Labs mailed.  ______

## 2011-02-22 NOTE — Telephone Encounter (Signed)
Message copied by Beverely Low on Mon Feb 22, 2011 11:48 AM ------      Message from: Sheliah Hatch      Created: Fri Feb 19, 2011  4:32 PM       Labs look great!  Keep up the good work!

## 2011-02-23 LAB — VITAMIN D 1,25 DIHYDROXY
Vitamin D 1, 25 (OH)2 Total: 45 pg/mL (ref 18–72)
Vitamin D2 1, 25 (OH)2: <8 pg/mL
Vitamin D3 1, 25 (OH)2: 45 pg/mL

## 2011-02-24 ENCOUNTER — Ambulatory Visit: Payer: 59

## 2011-02-24 NOTE — Progress Notes (Signed)
Quick Note:    Labs mailed.  ______

## 2011-03-01 ENCOUNTER — Ambulatory Visit
Admission: RE | Admit: 2011-03-01 | Discharge: 2011-03-01 | Disposition: A | Discharge status: Home / Self Care | Payer: 59 | Source: Ambulatory Visit | Attending: Obstetrics & Gynecology | Admitting: Obstetrics & Gynecology

## 2011-03-01 DIAGNOSIS — Z1231 Encounter for screening mammogram for malignant neoplasm of breast: Secondary | ICD-10-CM

## 2011-03-05 ENCOUNTER — Other Ambulatory Visit: Payer: Self-pay | Admitting: Obstetrics & Gynecology

## 2011-03-05 DIAGNOSIS — R928 Other abnormal and inconclusive findings on diagnostic imaging of breast: Secondary | ICD-10-CM

## 2011-03-18 ENCOUNTER — Ambulatory Visit
Admission: RE | Admit: 2011-03-18 | Discharge: 2011-03-18 | Disposition: A | Discharge status: Home / Self Care | Payer: 59 | Source: Ambulatory Visit | Attending: Obstetrics & Gynecology | Admitting: Obstetrics & Gynecology

## 2011-03-18 DIAGNOSIS — R928 Other abnormal and inconclusive findings on diagnostic imaging of breast: Secondary | ICD-10-CM

## 2011-05-21 ENCOUNTER — Ambulatory Visit (INDEPENDENT_AMBULATORY_CARE_PROVIDER_SITE_OTHER): Payer: 59 | Admitting: Family Medicine

## 2011-05-21 ENCOUNTER — Encounter: Payer: Self-pay | Admitting: Family Medicine

## 2011-05-21 DIAGNOSIS — F3289 Other specified depressive episodes: Secondary | ICD-10-CM

## 2011-05-21 DIAGNOSIS — F329 Major depressive disorder, single episode, unspecified: Secondary | ICD-10-CM

## 2011-05-21 DIAGNOSIS — M542 Cervicalgia: Secondary | ICD-10-CM | POA: Insufficient documentation

## 2011-05-21 MED ORDER — NAPROXEN 500 MG PO TABS: 500.0000 mg | ORAL_TABLET | Freq: Two times a day (BID) | ORAL | Status: AC

## 2011-05-21 MED ORDER — CYCLOBENZAPRINE HCL 10 MG PO TABS: 10.0000 mg | ORAL_TABLET | Freq: Three times a day (TID) | ORAL | Status: AC | PRN

## 2011-05-21 MED ORDER — BUPROPION HCL ER (XL) 150 MG PO TB24: 150.0000 mg | ORAL_TABLET | ORAL | Status: DC

## 2011-05-21 NOTE — Patient Instructions (Signed)
Follow up in 2 months to recheck mood Start the Wellbutrin daily STOP smoking!  You can do it! Start the Flexeril at night for muscle spasm Take the Naproxen- 1 twice daily x5 days- and then as needed (take w/ food) Heating pad for pain relief Call with any questions or concerns Hang in there! Happy Holidays!

## 2011-05-21 NOTE — Assessment & Plan Note (Addendum)
Chronic problem- unchanged.  Pt did not do well on high dose of Wellbutrin.  Will restart at lower dose.  Reviewed supportive care and red flags that should prompt return.  Pt expressed understanding and is in agreement w/ plan.

## 2011-05-21 NOTE — Progress Notes (Signed)
  Subjective:    Patient ID: [name] [name], female    DOB: [birth year], 40 y.o.   MRN: [id number]  HPI Depression- pt reports restarting Wellbutrin at 300mg  'was too much'.  Was 'aggressive'.  Wants to restart at 150mg .  Neck pain- L sided, has been present for a couple months.  Has had massage w/out relief.  Feels this is stress related and due to weight training.  Feels 'tight'.   Review of Systems For ROS see HPI     Objective:   Physical Exam  Vitals reviewed. Constitutional: She appears well-developed and well-nourished. No distress.  HENT:  Head: Normocephalic and atraumatic.  Neck: Normal range of motion. Neck supple. No thyromegaly present.       + L trap spasm, TTP  Lymphadenopathy:    She has no cervical adenopathy.  Psychiatric: She has a normal mood and affect. Her behavior is normal. Judgment and thought content normal.          Assessment & Plan:

## 2011-05-21 NOTE — Assessment & Plan Note (Signed)
New.  + trap spasm on L.  Start muscle relaxer, NSAIDs.  Heat prn.  Reviewed supportive care and red flags that should prompt return.  Pt expressed understanding and is in agreement w/ plan.

## 2011-08-12 ENCOUNTER — Ambulatory Visit (INDEPENDENT_AMBULATORY_CARE_PROVIDER_SITE_OTHER): Payer: 59 | Admitting: Family Medicine

## 2011-08-12 ENCOUNTER — Encounter: Payer: Self-pay | Admitting: Family Medicine

## 2011-08-12 VITALS — BP 122/82 | HR 97 | Temp 98.4°F | Ht 63.5 in | Wt 125.4 lb

## 2011-08-12 DIAGNOSIS — J302 Other seasonal allergic rhinitis: Secondary | ICD-10-CM | POA: Insufficient documentation

## 2011-08-12 DIAGNOSIS — J309 Allergic rhinitis, unspecified: Secondary | ICD-10-CM

## 2011-08-12 DIAGNOSIS — R194 Change in bowel habit: Secondary | ICD-10-CM | POA: Insufficient documentation

## 2011-08-12 DIAGNOSIS — K5901 Slow transit constipation: Secondary | ICD-10-CM

## 2011-08-12 DIAGNOSIS — R21 Rash and other nonspecific skin eruption: Secondary | ICD-10-CM

## 2011-08-12 NOTE — Progress Notes (Signed)
  Subjective:    Patient ID: [name] [name], female    DOB: Apr 03, 1971, 41 y.o.   MRN: [id number]  HPI abd pain- sxs started 3 weeks ago.  Coworker had GI bug so pt thought this is what she had.  + chills, body aches, nausea, diarrhea.  sxs improved.  Returned after eating [place].  Went to UC on Saturday, had negative H pylori testing.  WBC of 8.6.  Was started on Protonix and Zofran.  They recommended GI specialist but pt declined.  Pt reports she is now scared to eat b/c of the abdominal pain.  Pt went 8 days w/out BM- had large hard stool yesterday.  + bloating.  + family hx of Crohns.  Increased stress recently.  Rash- R pinky finger and R chest, itchy, red  Sinus pressure/drainage- pt reports sxs x1 month but have been occuring intermittently 'for awhile'.  Denies facial pain/tooth pain.  No ear pain.  Minimal cough.  + nasal congestion   Review of Systems For ROS see HPI     Objective:   Physical Exam  Vitals reviewed. Constitutional: She appears well-developed and well-nourished. No distress.  HENT:  Head: Normocephalic and atraumatic.  Right Ear: Tympanic membrane normal.  Left Ear: Tympanic membrane normal.  Nose: Mucosal edema and rhinorrhea present. Right sinus exhibits no maxillary sinus tenderness and no frontal sinus tenderness. Left sinus exhibits no maxillary sinus tenderness and no frontal sinus tenderness.  Mouth/Throat: Mucous membranes are normal. Posterior oropharyngeal erythema (w/ PND) present.  Eyes: Conjunctivae and EOM are normal. Pupils are equal, round, and reactive to light.  Neck: Normal range of motion. Neck supple.  Cardiovascular: Normal rate, regular rhythm and normal heart sounds.   Pulmonary/Chest: Effort normal and breath sounds normal. No respiratory distress. She has no wheezes. She has no rales.  Abdominal: Soft. Bowel sounds are normal. She exhibits distension. She exhibits no mass. There is no tenderness. There is no rebound and no guarding.    Lymphadenopathy:    She has no cervical adenopathy.  Skin: Skin is warm and dry. Rash (fine maculopapular rash on hand and chest) noted.          Assessment & Plan:

## 2011-08-12 NOTE — Patient Instructions (Signed)
Follow up in 1 month to recheck symptoms Start Miralax twice daily until you're having bowel movements daily- then decrease to once daily Increase your water intake Apply Hydrocortisone twice daily to the rash Start claritin or zyrtec daily for your sinus pressure Call with any questions or concerns Hang in there!

## 2011-08-22 NOTE — Assessment & Plan Note (Signed)
New.  Pt's sxs most consistent w/ constipation.  Normal WBC indicates no infxn.  Agree w/ PPI.  No bloody stools that would be consistent w/ IBD.  Start Miralax 1-2x/day w/ goal of 1 BM daily.  Will follow closely.  Reviewed supportive care and red flags that should prompt return.  Pt expressed understanding and is in agreement w/ plan.

## 2011-08-22 NOTE — Assessment & Plan Note (Signed)
New.  No obvious cause but due to limited distribution will start hydrocortisone cream bid.  Reviewed supportive care and red flags that should prompt return.  Pt expressed understanding and is in agreement w/ plan.

## 2011-08-22 NOTE — Assessment & Plan Note (Signed)
New.  No evidence of sinus infxn.  More consistent w/ allergic rhinitis.  Start OTC antihistamine.  Reviewed supportive care and red flags that should prompt return.  Pt expressed understanding and is in agreement w/ plan.

## 2011-08-27 ENCOUNTER — Other Ambulatory Visit: Payer: Self-pay | Admitting: Obstetrics & Gynecology

## 2011-08-27 ENCOUNTER — Ambulatory Visit
Admission: RE | Admit: 2011-08-27 | Discharge: 2011-08-27 | Disposition: A | Discharge status: Home / Self Care | Payer: 59 | Source: Ambulatory Visit | Attending: Obstetrics & Gynecology | Admitting: Obstetrics & Gynecology

## 2011-08-27 IMAGING — US US ABDOMEN COMPLETE
1 series · 14 of 25 positions shown · non-contrast
Comparison: No comparison studies available.

CLINICAL DATA: Epigastric and left upper quadrant pain.  Nausea.
Weight loss.

ABDOMEN ULTRASOUND
TECHNIQUE: Complete abdominal ultrasound examination was performed
including evaluation of the liver, gallbladder, bile ducts,
pancreas, kidneys, spleen, IVC, and abdominal aorta.

[Series 1: us abdomen complete · 0.33mm/px · 14 of 68 slices shown]
[im 1/68]
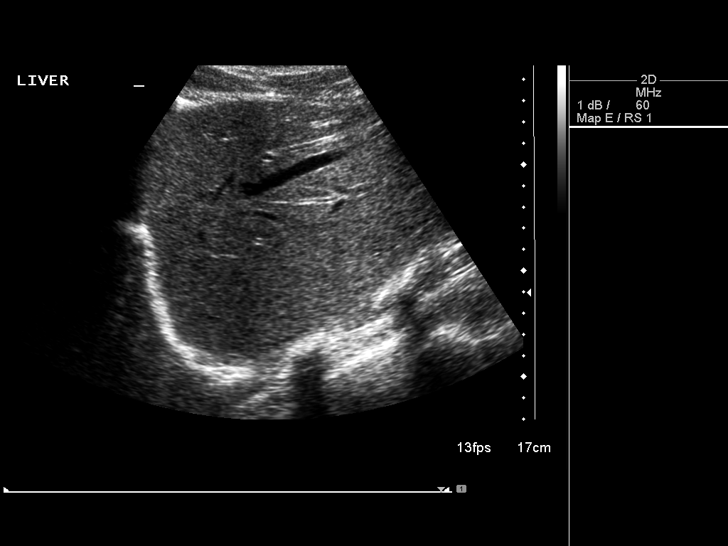
[im 6/68]
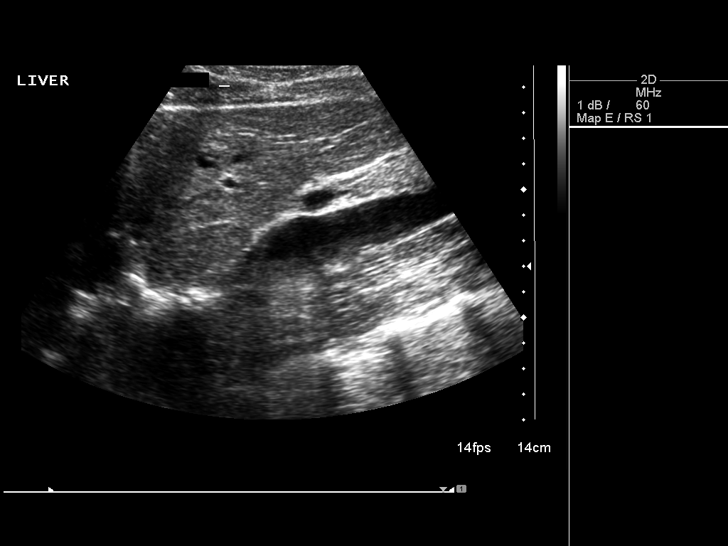
[im 12/68]
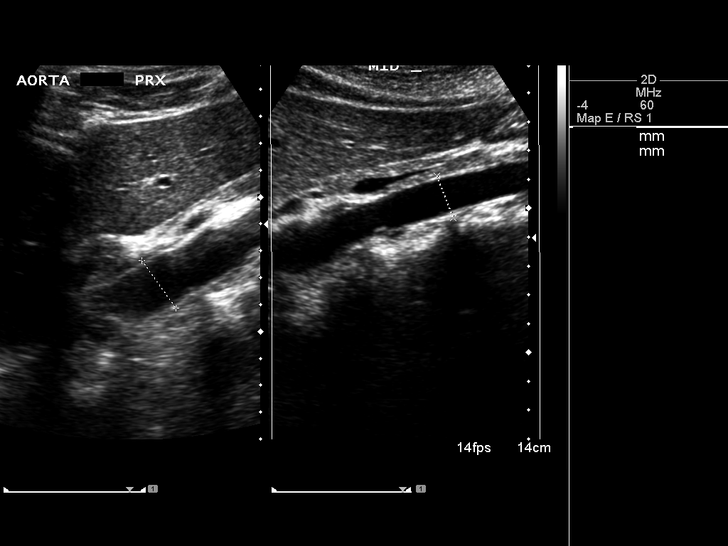
[im 17/68]
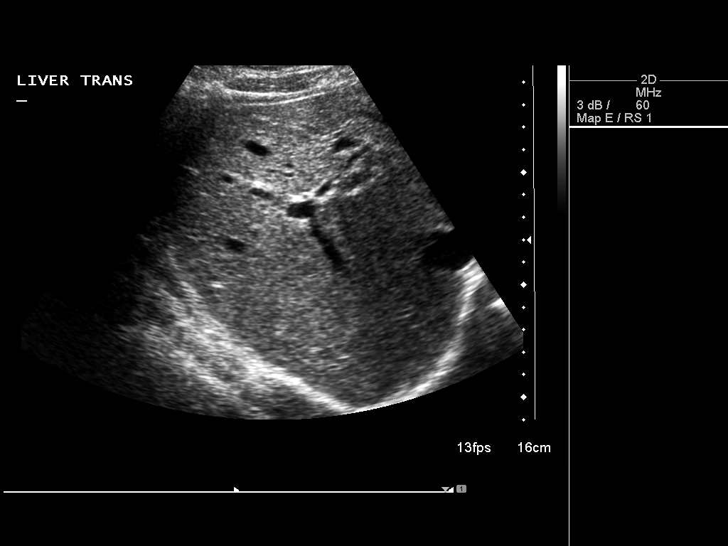
[im 23/68]
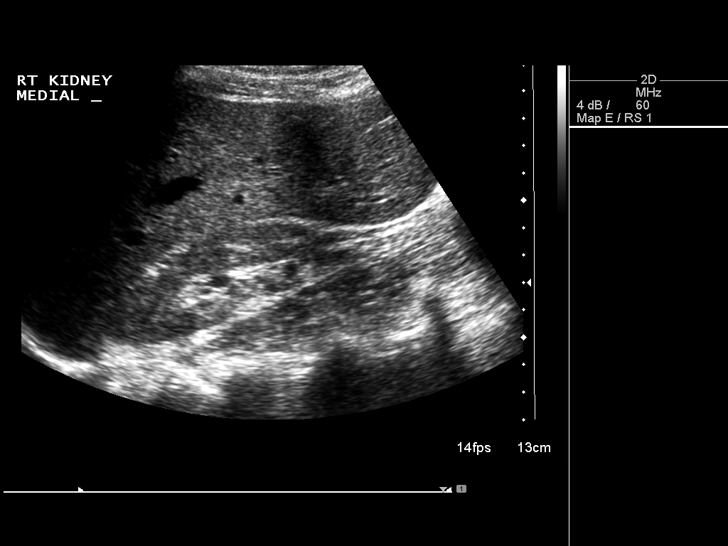
[im 26/68]
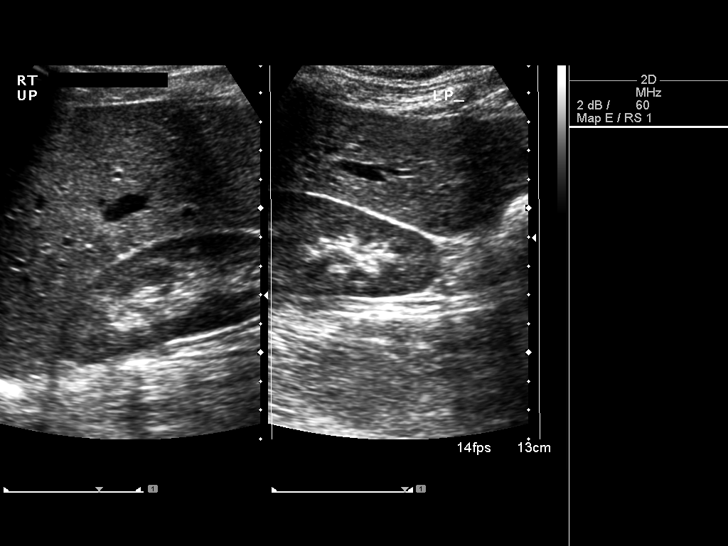
[im 31/68]
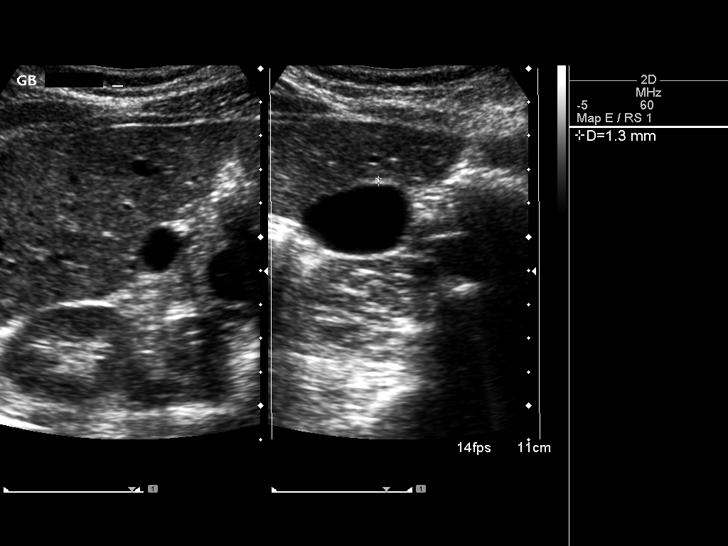
[im 37/68]
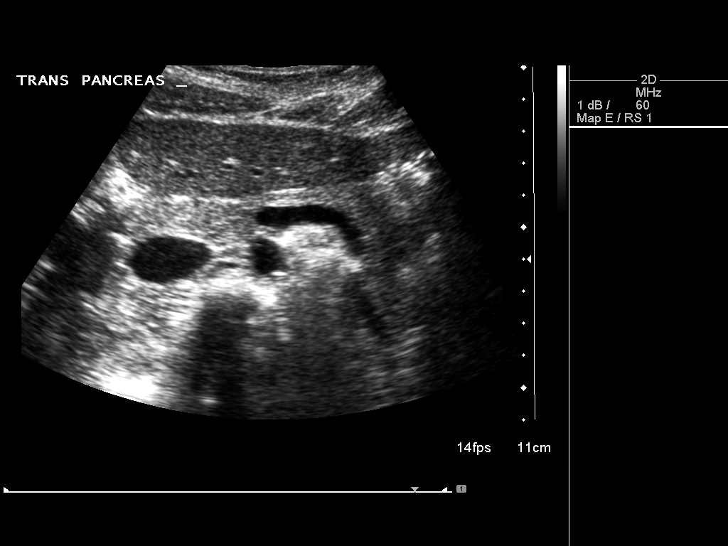
[im 42/68]
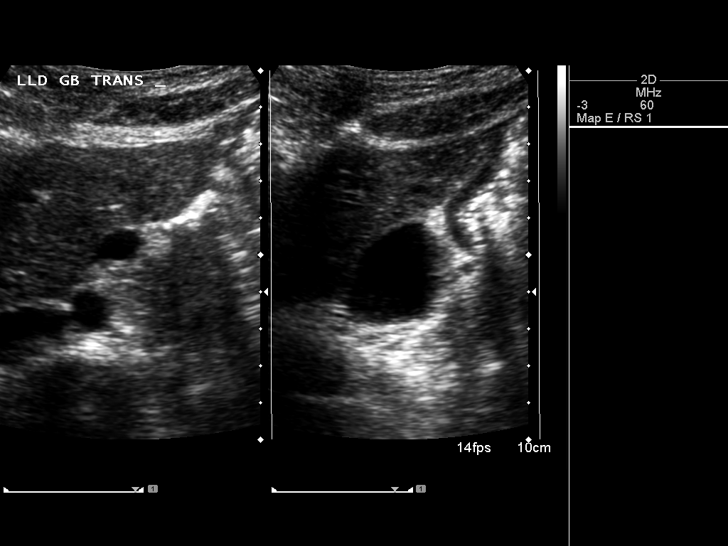
[im 45/68]
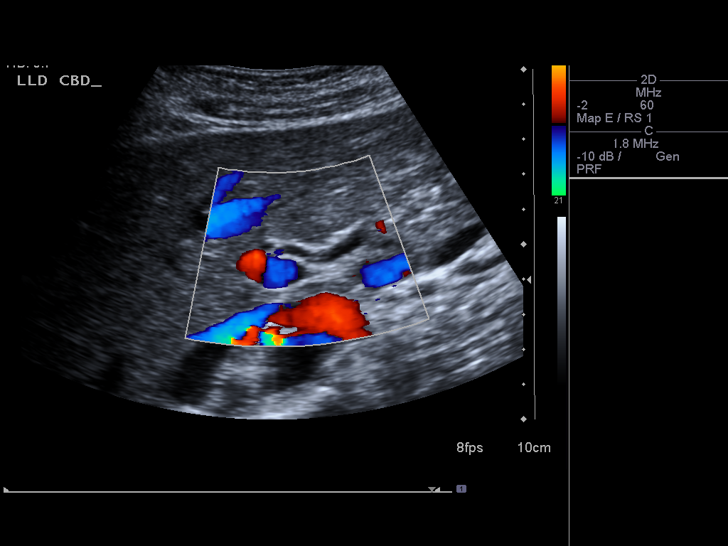
[im 51/68]
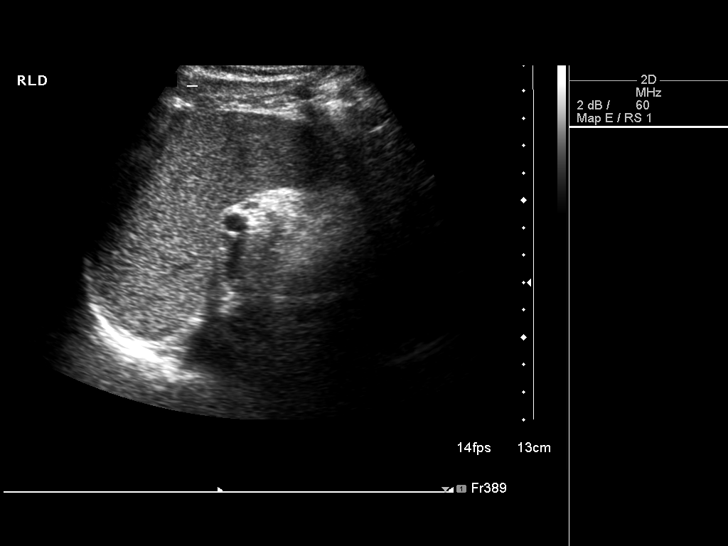
[im 56/68]
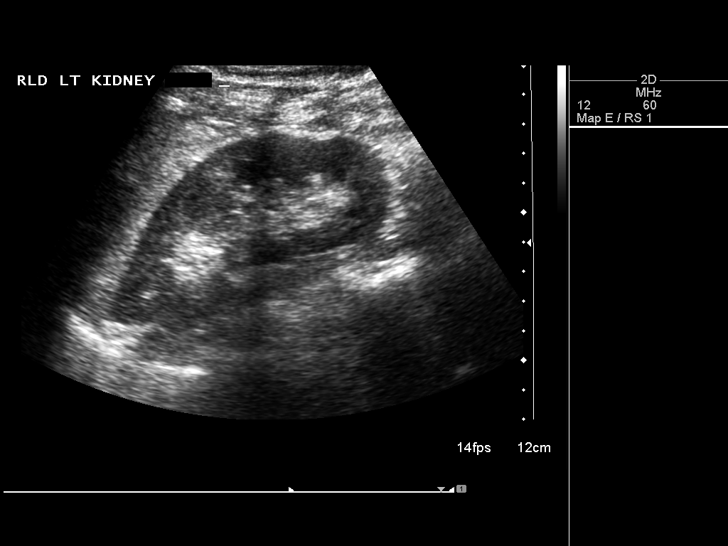
[im 62/68]
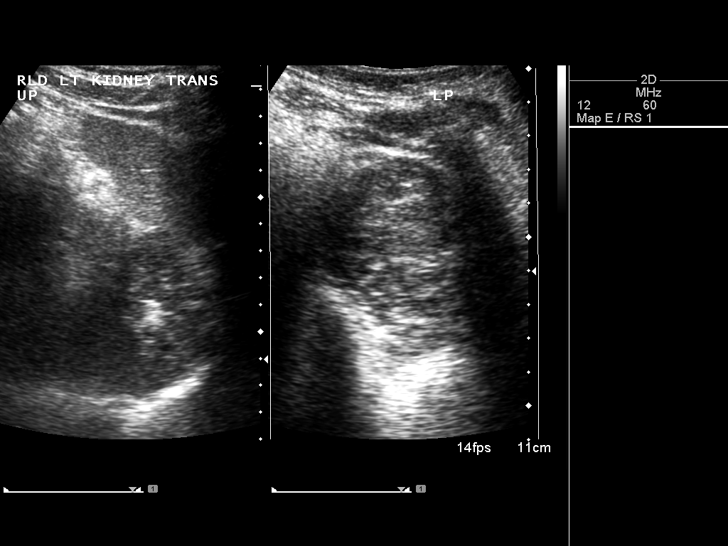
[im 68/68]
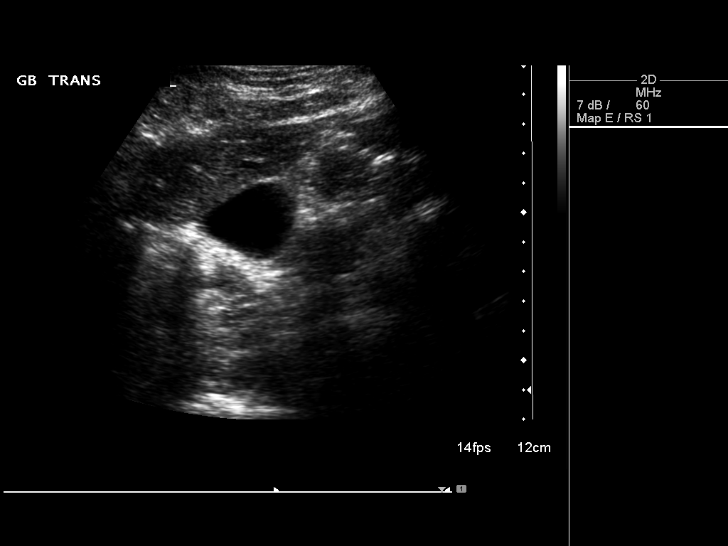

[14 of 25 positions shown; findings below may reference images not displayed]

FINDINGS: Gallbladder:  There is no evidence for gallstones.  No gallbladder
wall thickening or pericholecystic fluid.  The sonographer reports
no sonographic Murphy's sign.

Common Bile Duct:  Nondilated at 4 mm diameter.

Liver:  Normal.  No focal parenchymal abnormality.  No biliary
dilation.

IVC:  Normal.

Pancreas:  Normal.

Spleen:  Normal.

Right kidney:  11.3 cm in long axis.  Normal.

Left kidney:  10.6 cm in long axis.  Normal.

Abdominal Aorta:  No aneurysm.
IMPRESSION: Normal exam.  No findings to explain the patient's history of pain.

## 2011-09-16 ENCOUNTER — Encounter: Payer: Self-pay | Admitting: Family Medicine

## 2011-09-16 ENCOUNTER — Ambulatory Visit (INDEPENDENT_AMBULATORY_CARE_PROVIDER_SITE_OTHER): Payer: 59 | Admitting: Family Medicine

## 2011-09-16 VITALS — BP 124/80 | HR 71 | Temp 98.1°F | Ht 63.75 in | Wt 122.6 lb

## 2011-09-16 DIAGNOSIS — R142 Eructation: Secondary | ICD-10-CM

## 2011-09-16 DIAGNOSIS — F3289 Other specified depressive episodes: Secondary | ICD-10-CM

## 2011-09-16 DIAGNOSIS — R141 Gas pain: Secondary | ICD-10-CM

## 2011-09-16 DIAGNOSIS — F329 Major depressive disorder, single episode, unspecified: Secondary | ICD-10-CM

## 2011-09-16 MED ORDER — BUPROPION HCL ER (XL) 150 MG PO TB24: 150.0000 mg | ORAL_TABLET | ORAL | Status: DC

## 2011-09-16 MED ORDER — DICYCLOMINE HCL 20 MG PO TABS: 20.0000 mg | ORAL_TABLET | Freq: Four times a day (QID) | ORAL | Status: DC

## 2011-09-16 NOTE — Progress Notes (Signed)
  Subjective:    Patient ID: [name] [name], female    DOB: 11-21-1970, 41 y.o.   MRN: [id number]  HPI Constipation- started Miralax at last visit and began having BMs daily.  This did not improve nausea, abd pain.  Had IUD removed.  GYN checked LFTs, slightly elevated, had abd [place].  Saw Dr Elnoria Howard 2 weeks ago (GI).  Was started on Zegrid in the AM and Protonix in the PM.  Felt good for 1 week.  Now again waking up w/ abdominal cramping and feeling as if she's going to have diarrhea but she doesn't stool.  Has f/u appt w/ Dr Elnoria Howard pending.   Review of Systems For ROS see HPI     Objective:   Physical Exam  Vitals reviewed. Constitutional: She appears well-developed and well-nourished. No distress.  Abdominal: Soft. Bowel sounds are normal. She exhibits no distension. There is no tenderness. There is no rebound.          Assessment & Plan:

## 2011-09-16 NOTE — Patient Instructions (Signed)
Call Dr Elnoria Howard and let him know about your symptoms Start the Dicyclomine (Bentyl) as needed for abdominal cramping Drink plenty of water Add a fiber supplement Continue the acid reducers as directed Call with any questions or concerns Hang in there!!!

## 2011-09-19 NOTE — Assessment & Plan Note (Signed)
Restart Wellbutrin 

## 2011-09-19 NOTE — Assessment & Plan Note (Signed)
Persistent.  Now following w/ GI.  Pt w/ components of IBS.  Start bentyl to improve sxs.  Will continue to follow and assist as able.

## 2011-10-21 ENCOUNTER — Other Ambulatory Visit: Payer: Self-pay | Admitting: Obstetrics & Gynecology

## 2011-10-21 DIAGNOSIS — Z09 Encounter for follow-up examination after completed treatment for conditions other than malignant neoplasm: Secondary | ICD-10-CM

## 2011-10-27 ENCOUNTER — Ambulatory Visit
Admission: RE | Admit: 2011-10-27 | Discharge: 2011-10-27 | Disposition: A | Discharge status: Home / Self Care | Payer: 59 | Source: Ambulatory Visit | Attending: Obstetrics & Gynecology | Admitting: Obstetrics & Gynecology

## 2011-10-27 DIAGNOSIS — Z09 Encounter for follow-up examination after completed treatment for conditions other than malignant neoplasm: Secondary | ICD-10-CM

## 2011-11-04 ENCOUNTER — Ambulatory Visit: Payer: 59 | Admitting: Family Medicine

## 2012-09-18 ENCOUNTER — Encounter: Payer: Self-pay | Admitting: Obstetrics & Gynecology

## 2012-09-18 ENCOUNTER — Encounter: Payer: Self-pay | Admitting: Gynecology

## 2012-09-18 ENCOUNTER — Other Ambulatory Visit: Payer: Self-pay | Admitting: Obstetrics & Gynecology

## 2012-09-18 ENCOUNTER — Ambulatory Visit (INDEPENDENT_AMBULATORY_CARE_PROVIDER_SITE_OTHER): Payer: 59 | Admitting: Obstetrics & Gynecology

## 2012-09-18 ENCOUNTER — Encounter: Payer: Self-pay | Admitting: *Deleted

## 2012-09-18 VITALS — BP 120/78 | HR 60 | Wt 132.0 lb

## 2012-09-18 DIAGNOSIS — N898 Other specified noninflammatory disorders of vagina: Secondary | ICD-10-CM

## 2012-09-18 DIAGNOSIS — N920 Excessive and frequent menstruation with regular cycle: Secondary | ICD-10-CM

## 2012-09-18 DIAGNOSIS — N9489 Other specified conditions associated with female genital organs and menstrual cycle: Secondary | ICD-10-CM

## 2012-09-18 DIAGNOSIS — M25559 Pain in unspecified hip: Secondary | ICD-10-CM

## 2012-09-18 DIAGNOSIS — R102 Pelvic and perineal pain: Secondary | ICD-10-CM

## 2012-09-18 NOTE — Patient Instructions (Signed)
Patient will return at 3pm for Ultrasound today.

## 2012-09-18 NOTE — Progress Notes (Signed)
42 y.o. Married Caucasian female [phone] here for complaint of pelvic pain/burning sensation in the right lower quadrant that has been going on for at least two to three months. Pain does get better and worse at time.  It does not seem to be aggravated by anything in particular.  Not worse with intercourse or bowel movement.  She has not tried any over the counter treatments.  She considered a bladder analgesic but then decided to see her PCP to make sure it wasn't a bladder infection.  She went on 4/17 to see PCP.  Urinalysis was negative.  Culture was negative.  Treated with Ciprofloxin.  She has the same reaction to the Cipro as last year--felt "violently ill" but never had diarrhea or emesis.  Have informed pt to notify other physicians of Cipro allergy.  Having today now a sensation of "burning" in her right lower quadrant.  She is also having watery discharge.  No odor, no blood.  And also bloating.  No fevers.  No new sex partners.  LMP 09/06/12--which was five days late.  When started, her flow was slimey and dark but turned more normal.  Lasted three days with first two days heavy.  Changing tampons every 1 to 1 1/2 hours.  She is not sure if the "burnign sensation" is cycle related.  Continues to have reflux issues and bloating issues which are mostly related to stress.  On Wellbutrin 150mg  XL.  Did try Prozac but didn't like the way it made her feel.  Denies N/V/D/C.  ROS: All other ROS questions are negative except as per HPI.  Exam:   BP 120/78  Pulse 60  Wt 132 lb (59.875 kg)  BMI 22.84 kg/m2  LMP 09/06/2012 General appearance: alert and cooperative Abdomen:  soft, non-tender; bowel sounds normal; no masses,  no organomegaly Lymph:  Inguinal adenopathy: neg   Pelvic: External genitalia:  no lesions              Urethra: not indicated and normal appearing urethra with no masses, tenderness or lesions              Bartholins and Skenes: normal                 Vagina: mucosa normal with  presence of vaginal discharge - white and copious              Cervix: normal appearance              Pap taken: no Bimanual Exam:  Uterus:  uterus is normal size, shape, consistency and nontender                               Adnexa:   normal adnexa in size, nontender and no masses                               Anus:  defer exam  Wet prep was obtained.  Results:  pH 4.0 and no whiff, occ WBCs, no RBCs, no trich  A: pelvic pain, menorrhagia, vaginal discharge with normal wet smear/KOH  P: Plan PUS.  D/W patient causes of pelvic pain including endometriosis and I.C.  For now feel patient would benefit from proceeding with BTL (and evaluation for endometriosis at same time) as well as endometrial ablation.  Will do endometrial biopsy at same time as ultrasound. Pyridium 100mg  tid  for next four to five days to see if this helps pain symptoms.  My need cystoscopy at same time at above.       An After Visit Summary was printed and given to the patient.

## 2012-09-19 ENCOUNTER — Other Ambulatory Visit: Payer: Self-pay | Admitting: Obstetrics & Gynecology

## 2012-09-19 ENCOUNTER — Ambulatory Visit (INDEPENDENT_AMBULATORY_CARE_PROVIDER_SITE_OTHER): Payer: 59

## 2012-09-19 ENCOUNTER — Ambulatory Visit (INDEPENDENT_AMBULATORY_CARE_PROVIDER_SITE_OTHER): Payer: 59 | Admitting: Obstetrics & Gynecology

## 2012-09-19 DIAGNOSIS — N949 Unspecified condition associated with female genital organs and menstrual cycle: Secondary | ICD-10-CM

## 2012-09-19 DIAGNOSIS — D251 Intramural leiomyoma of uterus: Secondary | ICD-10-CM

## 2012-09-19 DIAGNOSIS — D259 Leiomyoma of uterus, unspecified: Secondary | ICD-10-CM

## 2012-09-19 DIAGNOSIS — N8300 Follicular cyst of ovary, unspecified side: Secondary | ICD-10-CM

## 2012-09-19 DIAGNOSIS — D3911 Neoplasm of uncertain behavior of right ovary: Secondary | ICD-10-CM

## 2012-09-19 DIAGNOSIS — N838 Other noninflammatory disorders of ovary, fallopian tube and broad ligament: Secondary | ICD-10-CM

## 2012-09-19 DIAGNOSIS — N839 Noninflammatory disorder of ovary, fallopian tube and broad ligament, unspecified: Secondary | ICD-10-CM

## 2012-09-19 DIAGNOSIS — G8929 Other chronic pain: Secondary | ICD-10-CM

## 2012-09-19 DIAGNOSIS — D3910 Neoplasm of uncertain behavior of unspecified ovary: Secondary | ICD-10-CM

## 2012-09-19 DIAGNOSIS — R1031 Right lower quadrant pain: Secondary | ICD-10-CM

## 2012-09-19 DIAGNOSIS — N920 Excessive and frequent menstruation with regular cycle: Secondary | ICD-10-CM

## 2012-09-19 DIAGNOSIS — Z3009 Encounter for other general counseling and advice on contraception: Secondary | ICD-10-CM

## 2012-09-19 DIAGNOSIS — R102 Pelvic and perineal pain: Secondary | ICD-10-CM

## 2012-09-19 HISTORY — PX: ENDOMETRIAL BIOPSY: SHX622

## 2012-09-19 MED ORDER — TRAMADOL HCL 50 MG PO TABS: 50.0000 mg | ORAL_TABLET | Freq: Four times a day (QID) | ORAL | Status: DC | PRN

## 2012-09-19 NOTE — Progress Notes (Signed)
42 y.o. Separate female [phone] here for a pelvic ultrasound with sonohystogram due to RLQ pelvic pain, bloating, menorrhagia.  She is interested in proceeding with treatment options if evaluation is negative today.  Indication: Pelvic pain, bloating, menorrhagia  Patient's last menstrual period was 09/06/2012.  Contraception: Condoms.  No risk of pregnancy this month.  Technique:  Both transabdominal and transvaginal ultrasound  examinations of the pelvis were performed. Transabdominal technique  was performed for global imaging of the pelvis including uterus,  ovaries, adnexal regions, and pelvic cul-de-sac.  It was necessary to proceed with endovaginal exam following the abdominal ultrasound transabdominal exam to visualize the endometrium and adnexa.  Color and duplex Doppler ultrasound was utilized to evaluate blood  flow to the ovaries.    FINDINGS: Report is below but significant findings include   UTERUS: 7.3 x 4.8 x 4.5 cm uterus with 2.1cm fibroid, posterior intramural abuts cavity EMS: 5.53mm ADNEXA: Left ovary 2.4 x 1.2 x 1.3cm.  Right ovary 3.6 x 3.0 x 2.7cm with 3.1 x 2.4 x 2.9cm solid mass with increase doppler flow to periphery of ovary. CUL DE SAC: no free fluid  SHSG:  After obtaining appropriate verbal consent from patient, the cervix was visualized using a speculum, and prepped with betadine.  A tenaculum  was not applied to the cervix.  Dilation of the cervix was not necessary. The catheter was passed into the uterus and sterile saline introduced, with the following findings:  No defects noted.  Endometrial biopsy recommended.  Discussed with patient.  Verbal and written consent obtained.   Procedure:  Speculum re-placed.  Cervix visualized and cleansed with betadine prep.  A single toothed tenaculum was applied to the anterior lip of the cervix.  Endometrial pipelle was advanced through the cervix into the endometrial cavity without difficulty.  Pipelle passed to  7.0cm.  Suction applied and pipelle removed with good tissue sample obtained.  Tenculum removed.  No bleeding noted.  Patient tolerated procedure well.  Images and results discussed with patient.  She would really like to proceed with having her tubes tied as well as proceeding with endometrial ablation for treatment of bleeding.  Knows she should never get pregnant after this procedure.  Because of solid finding on ovary in location of pt's pain, I feel we should remove this ovary at the same time.  She is in agreement.  Has questions about possibility of needing hormonal therapy after surgery.  Risks discussed with pt including but not limited to bleeding, infection, 2-3% risk of bowel/bladder/ureteral injury, uterine perforation and need to reschedule ablation, 10% failure rate of ablation, risk for future surgery if ovary is pathologically abnormal, failure rate for tubal ligation discussed.  She accepts all risks and is ready to proceed.  Assessment:  RLQ pelvic pain, bloating which is probably due to IBS, undesired fertility, menorrhagia, small 2.1 cm fibroid  Plan: Ca-125 Endometrial biopsy pending Tramadol 50mg -100mg  q 6 hours prn.  Order sent to pharamcy Plan laparoscopic RSO, tubal ligation on the left side, endometrial ablation, and cystoscopy to R/O I.C.  Pt will need to see Dr. Sherron Monday pre-operatively.

## 2012-09-20 ENCOUNTER — Encounter: Payer: Self-pay | Admitting: Obstetrics & Gynecology

## 2012-09-20 NOTE — Patient Instructions (Signed)
Results will be called and then surgery will be scheduled.

## 2012-09-22 ENCOUNTER — Telehealth: Payer: Self-pay | Admitting: *Deleted

## 2012-09-22 NOTE — Telephone Encounter (Signed)
Notified of appointment with Dr Sherron Monday.  She is ready to proceed with scheduling surgery in May following Dr MacDiarmid's appointment. She is aware that surgery will be precerted and she will be notified of her estimated cost before scheduling.

## 2012-09-22 NOTE — Telephone Encounter (Signed)
Referral to Dr Gildardo Griffes Diarmid scheduled for 10-04-12 at 1pm.  She will receive new patient forms in the mail.  Call to patient to notify. LMTCB.

## 2012-09-25 ENCOUNTER — Ambulatory Visit (INDEPENDENT_AMBULATORY_CARE_PROVIDER_SITE_OTHER): Payer: 59 | Admitting: Obstetrics & Gynecology

## 2012-09-25 ENCOUNTER — Encounter: Payer: Self-pay | Admitting: Obstetrics & Gynecology

## 2012-09-25 VITALS — BP 108/66 | Wt 131.0 lb

## 2012-09-25 DIAGNOSIS — N930 Postcoital and contact bleeding: Secondary | ICD-10-CM

## 2012-09-25 MED ORDER — NORETHINDRONE ACETATE 5 MG PO TABS: 5.0000 mg | ORAL_TABLET | Freq: Every day | ORAL | Status: DC

## 2012-09-25 NOTE — Progress Notes (Signed)
42 y.o. Married Caucasian female [phone] here with a 3 day history of bleeding that started after having intercourse on Friday evening.  She had no pain, just bright red bleeding that was heavier than period.  This has not occurred in the past.  She has no vaginal discharge or odor.  No fevers.  She was recently seen because her cycles have been getting heavier and and ablation is planned.   Prior evaluations:  SHG with Endometrial biopsy.  Continued reflux issus but no nausea/diarrhea/constipation/vomiting.  Patient is sexually active.  Condoms for contraception.  She did not try anything to try an alleviate symptoms.  ROS:All other ROS questions negative except as per HPI.   Past Medical History  Diagnosis Date  . Depression   . Anxiety   . IBS (irritable bowel syndrome)     Exam:   BP 108/66  Wt 131 lb (59.421 kg)  BMI 22.67 kg/m2  LMP 09/06/2012    General appearance: alert and cooperative Abdomen:abdomen is soft without significant tenderness, masses, organomegaly or guarding Skin: Skin color, texture, turgor normal. Lymph nodes: No abnormal inguinal nodes palpated Neurologic: Grossly normal  Pelvic: Pelvic Exam Female: External genitalia: normal general appearance Urinary system: urethral meatus normal Vaginal: normal mucosa without prolapse or lesions Cervix: normal appearance Adnexa: normal bimanual exam Uterus: mid-position and nontender  Assessment:  post-coital bleeding H/O menorrhagia  Plan:  Aygestin 5mg  bid until bleeding stops then decrease to qd for at least 7 more days.  She knows to expect bleeding after the Aygestin is stopped.

## 2012-09-25 NOTE — Telephone Encounter (Signed)
Patient states having heavy bleeding since Friday. Patient given appt. This morning for 12:15pm for Dr. Hyacinth Meeker.

## 2012-09-25 NOTE — Patient Instructions (Signed)
Take the tablet twice daily until bleeding stops.  Then decrease to daily for at least one more week.

## 2012-09-25 NOTE — Telephone Encounter (Signed)
Patient reports heavy bleeding since Friday. She had a uterine biopsy last Tuesday, 09/19/12. Please advise.

## 2012-10-02 ENCOUNTER — Encounter: Payer: Self-pay | Admitting: Obstetrics & Gynecology

## 2012-10-05 ENCOUNTER — Encounter (HOSPITAL_COMMUNITY): Payer: Self-pay | Admitting: Pharmacy Technician

## 2012-10-09 ENCOUNTER — Telehealth: Payer: Self-pay | Admitting: *Deleted

## 2012-10-09 NOTE — Telephone Encounter (Signed)
Patient notified of rescheduled surgery date in order to have both our procedure and MacDiarmid's procedure at same time.  Lap RSO/Left BTSP/ novasure scheduled for 10-31-12 at [facility] at 1100.  Surgical instructions reviewed and mailed to patient with pre/post op appointments noted.

## 2012-10-10 ENCOUNTER — Inpatient Hospital Stay (HOSPITAL_COMMUNITY): Admission: RE | Admit: 2012-10-10 | Payer: 59 | Source: Ambulatory Visit

## 2012-10-17 ENCOUNTER — Other Ambulatory Visit: Payer: Self-pay | Admitting: Urology

## 2012-10-20 ENCOUNTER — Ambulatory Visit (INDEPENDENT_AMBULATORY_CARE_PROVIDER_SITE_OTHER): Payer: 59 | Admitting: Obstetrics & Gynecology

## 2012-10-20 VITALS — BP 100/64 | HR 60 | Resp 12

## 2012-10-20 DIAGNOSIS — N839 Noninflammatory disorder of ovary, fallopian tube and broad ligament, unspecified: Secondary | ICD-10-CM

## 2012-10-20 DIAGNOSIS — N838 Other noninflammatory disorders of ovary, fallopian tube and broad ligament: Secondary | ICD-10-CM

## 2012-10-20 DIAGNOSIS — N912 Amenorrhea, unspecified: Secondary | ICD-10-CM

## 2012-10-20 DIAGNOSIS — N920 Excessive and frequent menstruation with regular cycle: Secondary | ICD-10-CM

## 2012-10-20 DIAGNOSIS — N9489 Other specified conditions associated with female genital organs and menstrual cycle: Secondary | ICD-10-CM

## 2012-10-20 LAB — POCT URINE PREGNANCY: Preg Test, Ur: NEGATIVE

## 2012-10-20 MED ORDER — IBUPROFEN 800 MG PO TABS: 800.0000 mg | ORAL_TABLET | Freq: Three times a day (TID) | ORAL | Status: DC | PRN

## 2012-10-20 MED ORDER — OXYCODONE-ACETAMINOPHEN 5-325 MG PO TABS: 2.0000 | ORAL_TABLET | ORAL | Status: DC | PRN

## 2012-10-20 NOTE — Progress Notes (Signed)
HPI 8 yrsLegally SeparatedCaucasian female here to discuss upcoming surgery.  She is going to have a laparoscopy with possible RSO and left salpingectomy for sterilization.  Endometrial ablation and cystoscopy also planned.  This is all being doing due to menorrhagia and pelvic pain.  She has seen Dr. Sherron Monday for outpatient consultation already.  Procedures discussed with patient.  Risks/benefits discussed including but not limited to bleeding, transfusion risk, infection, DVT/PE, bowel/bladder/uteteral injury risk, uterine performation with electrolyte imbalance, 10% failure of endometiral ablation with possible need for future surgery, failure risk of sterilization procedure.  All questions answered.    The patient's evaluation to date has included ultrasound and urology consultation.   Past Medical History  Diagnosis Date  . Depression   . Anxiety   . IBS (irritable bowel syndrome)     Personal history of reaction to anesthesia no, venous thromboembolism no, bleeding diathesis no, personal wish to avoid transfusion of blood products no  Past Surgical History  Procedure Laterality Date  . Cesarean section    . Dilation and curettage of uterus    . Endometrial biopsy  09/19/12    neg    Allergies: @RRALLERGIES @  Current Outpatient Prescriptions  Medication Sig Dispense Refill  . ALPRAZolam (XANAX) 0.5 MG tablet Take 0.5 mg by mouth at bedtime.       . ondansetron (ZOFRAN) 4 MG tablet Take 4 mg by mouth daily as needed for nausea.       . pantoprazole (PROTONIX) 40 MG tablet Take 40 mg by mouth daily.       . ranitidine (ZANTAC) 150 MG tablet Take 150 mg by mouth daily as needed.       Marland Kitchen ibuprofen (ADVIL,MOTRIN) 200 MG tablet Take 400 mg by mouth every 6 (six) hours as needed for pain.      Marland Kitchen ibuprofen (ADVIL,MOTRIN) 800 MG tablet Take 1 tablet (800 mg total) by mouth every 8 (eight) hours as needed for pain.  30 tablet  0  . oxyCODONE-acetaminophen (PERCOCET) 5-325 MG per tablet  Take 2 tablets by mouth every 4 (four) hours as needed for pain. use only as much as needed to relieve pain  20 tablet  0  . traMADol (ULTRAM) 50 MG tablet Take 50 mg by mouth every 6 (six) hours as needed for pain.       No current facility-administered medications for this visit.     reports that she has quit smoking. She has never used smokeless tobacco. She reports that  drinks alcohol. She reports that she does not use illicit drugs.     Family History  Problem Relation Age of Onset  . Stroke Father   . Club foot Son   . Non-Hodgkin's lymphoma Father   . Hypertension Mother   . Hypertension Father     Family history of reaction to anesthesia  no, venous thromboembolism no, bleeding diathesis no.  ROS: Gastrointestinal: positive for abdominal pain and bloating   PHYSICAL EXAM BP 100/64  Pulse 60  Resp 12  LMP 09/22/2012  General appearance: alert and cooperative Lungs: clear to auscultation bilaterally Heart: regular rate and rhythm, S1, S2 normal, no murmur, click, rub or gallop Abdomen: soft, non-tender; bowel sounds normal; no masses,  no organomegaly Extremities: extremities normal, atraumatic, no cyanosis or edema Skin: Skin color, texture, turgor normal. No rashes or lesions   ASSESSMENT: Pelvic pain Adnexal mass Menorrhagia  PLAN The patient is scheduled for cystoscopy, laparoscopy with possible RSO and left salpingectomy, Novasure endometrial  ablation procedure(s) at the [facility] on 10/31/12.

## 2012-10-20 NOTE — Patient Instructions (Signed)
Please call if you have any questions or problems that arise before surgery

## 2012-10-27 ENCOUNTER — Encounter (HOSPITAL_COMMUNITY): Payer: Self-pay

## 2012-10-27 ENCOUNTER — Encounter (HOSPITAL_COMMUNITY)
Admission: RE | Admit: 2012-10-27 | Discharge: 2012-10-27 | Disposition: A | Discharge status: Home / Self Care | Payer: 59 | Source: Ambulatory Visit | Attending: Obstetrics & Gynecology | Admitting: Obstetrics & Gynecology

## 2012-10-27 ENCOUNTER — Encounter: Payer: Self-pay | Admitting: Obstetrics & Gynecology

## 2012-10-27 HISTORY — DX: Gastro-esophageal reflux disease without esophagitis: K21.9

## 2012-10-27 LAB — SURGICAL PCR SCREEN
MRSA, PCR: NEGATIVE
Staphylococcus aureus: NEGATIVE

## 2012-10-27 LAB — CBC
Hemoglobin: 12.5 g/dL (ref 12.0–15.0)
RBC: 3.8 MIL/uL — ABNORMAL LOW (ref 3.87–5.11)
WBC: 7.4 10*3/uL (ref 4.0–10.5)

## 2012-10-27 NOTE — Patient Instructions (Addendum)
Your procedure is scheduled on:10/31/12  Enter through the Main Entrance at :0930am Pick up desk phone and dial [phone] and inform us of your arrival.  Please call [phone] if you have any problems the morning of surgery.  Remember: Do not eat or drink after midnight:Monday  You may brush your teeth the morning of surgery.    DO NOT wear jewelry, eye make-up, lipstick,body lotion, or dark fingernail polish.  (Polished toes are ok)    Patients discharged on the day of surgery will not be allowed to drive home. Wear loose fitting, comfortable clothes for your ride home.

## 2012-10-30 NOTE — H&P (Signed)
History of Present Illness   I was consulted by Dr. Hyacinth Meeker regarding [name] [name]' suprapubic pressure that sometimes flares after an episode of nausea and near vomiting. She will be sore from hip bone to hip bone for 3 days. She will have more pressure in the suprapubic area that is not relieved by voiding. She will go more frequently at that time. It has been around since February when she was diagnosed with irritable bowel syndrome and she is prone to constipation.  Rarely, she has urge incontinence not wearing a pad. She voids every 2 hours and gets up 0-1 time at night. She reports a reasonable flow and does feel empty.  She denies a history of kidney stones, previous GU surgery, and urinary tract infections.   She has no neurologic risk factors or symptoms. She has not had a hysterectomy. She does not report vaginal dryness or dyspareunia.   The symptoms are moderate in severity and recurring. I believe she is having her ovary removed on May 20. I think that Dr. Hyacinth Meeker wants me to perform a cystoscopy.   There is no other modifying factors or associated signs or symptoms. There is no other aggravating or relieving factors.    Past Medical History Problems  1. History of  Esophageal Reflux 530.81  Surgical History Problems  1. History of  Cesarean Section  Current Meds 1. Aleve TABS; Therapy: (Recorded:07May2014) to 2. Protonix 20 MG Oral Tablet Delayed Release; Therapy: (Recorded:07May2014) to 3. Zantac 150 Maximum Strength 150 MG Oral Tablet; Therapy: (Recorded:07May2014) to  Allergies Medication  1. No Known Drug Allergies  Family History Problems  1. Family history of  Death In The Family Father age 27 due to sepsis 2. Family history of  Family Health Status Number Of Children one son Denied  3. Family history of  Death In The Family Mother  Social History Problems    Alcohol Use 3 per week   Caffeine Use 5 per day   Marital History - Currently  Married separated as of 10/04/12   Occupation: IT trainer   Tobacco Use 305.1 1 pack a day for 10 years, quite 2 years ago as of 10/04/12 visit.  Review of Systems Constitutional, skin, eye, otolaryngeal, hematologic/lymphatic, cardiovascular, pulmonary, endocrine, musculoskeletal and psychiatric system(s) were reviewed and pertinent findings if present are noted.  Genitourinary: urinary frequency.  Gastrointestinal: nausea, abdominal pain, heartburn and constipation.  Neurological: dizziness.    Vitals Vital Signs [Data Includes: Last 1 Day]  07May2014 01:14PM  BMI Calculated: 22.4 BSA Calculated: 1.61 Height: 5 ft 3.5 in Weight: 128 lb  Blood Pressure: 123 / 76, Sitting Temperature: 98.5 F, Oral Heart Rate: 81 Respiration: 18  Physical Exam Constitutional: Well nourished and well developed . No acute distress.  ENT:. The ears and nose are normal in appearance.  Neck: The appearance of the neck is normal and no neck mass is present.  Pulmonary: No respiratory distress and normal respiratory rhythm and effort.  Cardiovascular: Heart rate and rhythm are normal . No peripheral edema.  Abdomen: The abdomen is soft and nontender. No masses are palpated. No CVA tenderness. No hernias are palpable. No hepatosplenomegaly noted.  Lymphatics: The femoral and inguinal nodes are not enlarged or tender.  Skin: Normal skin turgor, no visible rash and no visible skin lesions.  Neuro/Psych:. Mood and affect are appropriate.   . Genitourinary:  On physical examination, [name] [name] had tender levator muscles but no prolapse or diverticulum.  Bladder was nontender.  Results/Data    Today [name] [name] underwent a number of tests, which I personally reviewed.   Urinalysis: Negative.  Bladder Scan: Bladder scan residual was 0.0 mL. Urine [Data Includes: Last 1 Day]   07May2014  COLOR YELLOW   APPEARANCE CLEAR   SPECIFIC GRAVITY 1.020   pH 6.5   GLUCOSE NEG mg/dL  BILIRUBIN NEG   KETONE NEG  mg/dL  BLOOD SMALL   PROTEIN NEG mg/dL  UROBILINOGEN 0.2 mg/dL  NITRITE NEG   LEUKOCYTE ESTERASE NEG   SQUAMOUS EPITHELIAL/HPF RARE   WBC NONE SEEN WBC/hpf  RBC 0-2 RBC/hpf  BACTERIA RARE   CRYSTALS NONE SEEN   CASTS NONE SEEN    04 Oct 2012 1:04 PM   UA With REFLEX       COLOR YELLOW       APPEARANCE CLEAR       SPECIFIC GRAVITY 1.020       pH 6.5       GLUCOSE NEG       BILIRUBIN NEG       KETONE NEG       BLOOD SMALL       PROTEIN NEG       UROBILINOGEN 0.2       NITRITE NEG       LEUKOCYTE ESTERASE NEG       SQUAMOUS EPITHELIAL/HPF RARE       WBC NONE SEEN       CRYSTALS NONE SEEN       CASTS NONE SEEN       RBC 0-2       BACTERIA RARE    Assessment Assessed  1. Abdominal Pain 789.00 2. Urge Incontinence Of Urine 788.31  Plan  UA With REFLEX  Status: Resulted - Requires Verification  Done: 01Jan0001 12:00AM Ordered Today; For: Health Maintenance (V70.0); Ordered By: Alfredo Martinez  Due: 09May2014 Marked Important; Last Updated By: Nathaniel Man   Discussion/Summary   [name] [name] has flare-ups of abdominal pressure and discomfort associated with frequency. At baseline she has mild frequency and intermittent nocturia and uncommon urge incontinence not wearing a pad. I talked to her about a hydrodistension.   We talked about cystoscopy/hydrodistension and instillation in detail. Pros, cons, general surgical and anesthetic risks, and other options including watchful waiting were discussed. Risks were described but not limited to pain, infection, and bleeding. The risk of bladder perforation and management were discussed. The patient understands that it is primarily a diagnostic procedure.   [name] [name] is likely going to proceed with a hydrodistension but unfortunately I cannot do it on the 20th because I am already in the operating room. I will wait to hear from Dr. Hyacinth Meeker. I would be willing to accommodate one of my clinic days the first thing in the morning  if that works for her. I think [name] [name] also operates on [employer]. [name] [name] was understanding and she thinks her start time on the 20th is around 9 a.m. or 10 a.m.   I think if [name] [name]' hydrodistension was negative, my gut feeling would be that she does not have interstitial cystitis. I am going to send a copy of my note to Dr. Leda Quail to keep her updated on her treatment course.  After a thorough review of the management options for the patient's condition the patient  elected to proceed with surgical therapy as noted above. We have discussed the potential benefits and risks of the procedure, side effects of the proposed treatment,  the likelihood of the patient achieving the goals of the procedure, and any potential problems that might occur during the procedure or recuperation. Informed consent has been obtained.

## 2012-10-31 ENCOUNTER — Ambulatory Visit (HOSPITAL_COMMUNITY)
Admission: RE | Admit: 2012-10-31 | Discharge: 2012-10-31 | Disposition: A | Discharge status: Home / Self Care | Payer: 59 | Source: Ambulatory Visit | Attending: Obstetrics & Gynecology | Admitting: Obstetrics & Gynecology

## 2012-10-31 ENCOUNTER — Encounter (HOSPITAL_COMMUNITY): Payer: Self-pay | Admitting: Anesthesiology

## 2012-10-31 ENCOUNTER — Ambulatory Visit (HOSPITAL_COMMUNITY): Payer: 59 | Admitting: Anesthesiology

## 2012-10-31 ENCOUNTER — Encounter (HOSPITAL_COMMUNITY)
Admission: RE | Disposition: A | Discharge status: Home / Self Care | Payer: Self-pay | Source: Ambulatory Visit | Attending: Obstetrics & Gynecology

## 2012-10-31 DIAGNOSIS — K589 Irritable bowel syndrome without diarrhea: Secondary | ICD-10-CM | POA: Insufficient documentation

## 2012-10-31 DIAGNOSIS — N9489 Other specified conditions associated with female genital organs and menstrual cycle: Secondary | ICD-10-CM

## 2012-10-31 DIAGNOSIS — N838 Other noninflammatory disorders of ovary, fallopian tube and broad ligament: Secondary | ICD-10-CM

## 2012-10-31 DIAGNOSIS — Z302 Encounter for sterilization: Secondary | ICD-10-CM | POA: Insufficient documentation

## 2012-10-31 DIAGNOSIS — N949 Unspecified condition associated with female genital organs and menstrual cycle: Secondary | ICD-10-CM | POA: Insufficient documentation

## 2012-10-31 DIAGNOSIS — N920 Excessive and frequent menstruation with regular cycle: Secondary | ICD-10-CM | POA: Insufficient documentation

## 2012-10-31 DIAGNOSIS — N3941 Urge incontinence: Secondary | ICD-10-CM | POA: Insufficient documentation

## 2012-10-31 DIAGNOSIS — R109 Unspecified abdominal pain: Secondary | ICD-10-CM | POA: Insufficient documentation

## 2012-10-31 HISTORY — PX: LAPAROSCOPY: SHX197

## 2012-10-31 HISTORY — PX: CYSTO WITH HYDRODISTENSION: SHX5453

## 2012-10-31 HISTORY — PX: DILITATION & CURRETTAGE/HYSTROSCOPY WITH NOVASURE ABLATION: SHX5568

## 2012-10-31 HISTORY — PX: BILATERAL SALPINGECTOMY: SHX5743

## 2012-10-31 SURGERY — DILATATION & CURETTAGE/HYSTEROSCOPY WITH NOVASURE ABLATION
Anesthesia: General | Site: Uterus | Wound class: Clean Contaminated

## 2012-10-31 MED ORDER — BUPIVACAINE HCL (PF) 0.25 % IJ SOLN
INTRAMUSCULAR | Status: AC
  Filled 2012-10-31: qty 30

## 2012-10-31 MED ORDER — NEOSTIGMINE METHYLSULFATE 1 MG/ML IJ SOLN
INTRAMUSCULAR | Status: DC | PRN
  Administered 2012-10-31: 3 mg via INTRAVENOUS

## 2012-10-31 MED ORDER — GLYCOPYRROLATE 0.2 MG/ML IJ SOLN
INTRAMUSCULAR | Status: DC | PRN
  Administered 2012-10-31: 0.1 mg via INTRAVENOUS
  Administered 2012-10-31: .5 mg via INTRAVENOUS

## 2012-10-31 MED ORDER — FENTANYL CITRATE 0.05 MG/ML IJ SOLN
INTRAMUSCULAR | Status: DC | PRN
  Administered 2012-10-31 (×3): 50 ug via INTRAVENOUS
  Administered 2012-10-31 (×2): 100 ug via INTRAVENOUS

## 2012-10-31 MED ORDER — STERILE WATER FOR IRRIGATION IR SOLN
Status: DC | PRN
  Administered 2012-10-31: 1000 mL via INTRAVESICAL

## 2012-10-31 MED ORDER — PHENAZOPYRIDINE HCL 200 MG PO TABS
Freq: Once | ORAL | Status: AC
  Administered 2012-10-31: 13:00:00 via INTRAVESICAL
  Filled 2012-10-31: qty 15

## 2012-10-31 MED ORDER — SODIUM CHLORIDE 0.9 % IJ SOLN
INTRAMUSCULAR | Status: DC | PRN
  Administered 2012-10-31: 8 mL

## 2012-10-31 MED ORDER — CEFAZOLIN SODIUM-DEXTROSE 2-3 GM-% IV SOLR
INTRAVENOUS | Status: AC
  Filled 2012-10-31: qty 50

## 2012-10-31 MED ORDER — PROPOFOL 10 MG/ML IV EMUL
INTRAVENOUS | Status: AC
  Filled 2012-10-31: qty 20

## 2012-10-31 MED ORDER — FENTANYL CITRATE 0.05 MG/ML IJ SOLN
INTRAMUSCULAR | Status: AC
  Filled 2012-10-31: qty 5

## 2012-10-31 MED ORDER — CEFAZOLIN SODIUM-DEXTROSE 2-3 GM-% IV SOLR
2.0000 g | INTRAVENOUS | Status: AC
  Administered 2012-10-31: 2 g via INTRAVENOUS

## 2012-10-31 MED ORDER — LIDOCAINE HCL (CARDIAC) 20 MG/ML IV SOLN
INTRAVENOUS | Status: DC | PRN
  Administered 2012-10-31: 40 mg via INTRAVENOUS

## 2012-10-31 MED ORDER — OXYCODONE-ACETAMINOPHEN 5-325 MG PO TABS
ORAL_TABLET | ORAL | Status: AC
  Administered 2012-10-31: 1
  Filled 2012-10-31: qty 1

## 2012-10-31 MED ORDER — GLYCOPYRROLATE 0.2 MG/ML IJ SOLN
INTRAMUSCULAR | Status: AC
  Filled 2012-10-31: qty 2

## 2012-10-31 MED ORDER — ACETAMINOPHEN 650 MG RE SUPP
650.0000 mg | RECTAL | Status: DC | PRN
  Filled 2012-10-31: qty 1

## 2012-10-31 MED ORDER — ACETAMINOPHEN 160 MG/5ML PO SOLN: 975.0000 mg | Freq: Once | ORAL | Status: AC

## 2012-10-31 MED ORDER — LIDOCAINE HCL (CARDIAC) 20 MG/ML IV SOLN
INTRAVENOUS | Status: AC
  Filled 2012-10-31: qty 5

## 2012-10-31 MED ORDER — DEXAMETHASONE SODIUM PHOSPHATE 4 MG/ML IJ SOLN
INTRAMUSCULAR | Status: DC | PRN
  Administered 2012-10-31: 8 mg via INTRAVENOUS

## 2012-10-31 MED ORDER — SCOPOLAMINE 1 MG/3DAYS TD PT72
MEDICATED_PATCH | TRANSDERMAL | Status: AC
  Administered 2012-10-31: 1.5 mg via TRANSDERMAL
  Filled 2012-10-31: qty 1

## 2012-10-31 MED ORDER — ACETAMINOPHEN 325 MG PO TABS: 650.0000 mg | ORAL_TABLET | ORAL | Status: DC | PRN

## 2012-10-31 MED ORDER — OXYCODONE-ACETAMINOPHEN 5-325 MG PO TABS: 1.0000 | ORAL_TABLET | ORAL | Status: DC | PRN

## 2012-10-31 MED ORDER — KETOROLAC TROMETHAMINE 30 MG/ML IJ SOLN: 15.0000 mg | Freq: Once | INTRAMUSCULAR | Status: DC | PRN

## 2012-10-31 MED ORDER — ONDANSETRON HCL 4 MG/2ML IJ SOLN: 4.0000 mg | Freq: Four times a day (QID) | INTRAMUSCULAR | Status: DC | PRN

## 2012-10-31 MED ORDER — GLYCOPYRROLATE 0.2 MG/ML IJ SOLN
INTRAMUSCULAR | Status: AC
  Filled 2012-10-31: qty 1

## 2012-10-31 MED ORDER — DEXAMETHASONE SODIUM PHOSPHATE 10 MG/ML IJ SOLN
INTRAMUSCULAR | Status: AC
  Filled 2012-10-31: qty 1

## 2012-10-31 MED ORDER — PANTOPRAZOLE SODIUM 40 MG PO TBEC
DELAYED_RELEASE_TABLET | ORAL | Status: AC
  Administered 2012-10-31: 40 mg via ORAL
  Filled 2012-10-31: qty 1

## 2012-10-31 MED ORDER — ONDANSETRON HCL 4 MG/2ML IJ SOLN
INTRAMUSCULAR | Status: DC | PRN
  Administered 2012-10-31: 4 mg via INTRAVENOUS

## 2012-10-31 MED ORDER — FENTANYL CITRATE 0.05 MG/ML IJ SOLN: 25.0000 ug | INTRAMUSCULAR | Status: DC | PRN

## 2012-10-31 MED ORDER — LACTATED RINGERS IV SOLN
INTRAVENOUS | Status: DC | PRN
  Administered 2012-10-31: 3000 mL

## 2012-10-31 MED ORDER — BUPIVACAINE HCL (PF) 0.25 % IJ SOLN
INTRAMUSCULAR | Status: DC | PRN
  Administered 2012-10-31: 8 mL

## 2012-10-31 MED ORDER — MIDAZOLAM HCL 5 MG/5ML IJ SOLN
INTRAMUSCULAR | Status: DC | PRN
  Administered 2012-10-31: 2 mg via INTRAVENOUS

## 2012-10-31 MED ORDER — LIDOCAINE-EPINEPHRINE 1 %-1:100000 IJ SOLN
INTRAMUSCULAR | Status: DC | PRN
  Administered 2012-10-31: 10 mL

## 2012-10-31 MED ORDER — SCOPOLAMINE 1 MG/3DAYS TD PT72: 1.0000 | MEDICATED_PATCH | Freq: Once | TRANSDERMAL | Status: DC | PRN

## 2012-10-31 MED ORDER — KETOROLAC TROMETHAMINE 30 MG/ML IJ SOLN
INTRAMUSCULAR | Status: DC | PRN
  Administered 2012-10-31 (×2): 30 mg via INTRAVENOUS

## 2012-10-31 MED ORDER — PROPOFOL 10 MG/ML IV BOLUS
INTRAVENOUS | Status: DC | PRN
  Administered 2012-10-31: 150 mg via INTRAVENOUS

## 2012-10-31 MED ORDER — KETOROLAC TROMETHAMINE 30 MG/ML IJ SOLN
INTRAMUSCULAR | Status: AC
  Filled 2012-10-31: qty 2

## 2012-10-31 MED ORDER — MIDAZOLAM HCL 2 MG/2ML IJ SOLN
INTRAMUSCULAR | Status: AC
  Filled 2012-10-31: qty 2

## 2012-10-31 MED ORDER — ROCURONIUM BROMIDE 100 MG/10ML IV SOLN
INTRAVENOUS | Status: DC | PRN
  Administered 2012-10-31: 40 mg via INTRAVENOUS

## 2012-10-31 MED ORDER — LACTATED RINGERS IV SOLN
INTRAVENOUS | Status: DC
  Administered 2012-10-31 (×2): via INTRAVENOUS

## 2012-10-31 MED ORDER — PANTOPRAZOLE SODIUM 40 MG PO TBEC: 40.0000 mg | DELAYED_RELEASE_TABLET | Freq: Once | ORAL | Status: AC | PRN

## 2012-10-31 MED ORDER — NEOSTIGMINE METHYLSULFATE 1 MG/ML IJ SOLN
INTRAMUSCULAR | Status: AC
  Filled 2012-10-31: qty 1

## 2012-10-31 MED ORDER — ACETAMINOPHEN 160 MG/5ML PO SOLN
ORAL | Status: AC
  Administered 2012-10-31: 975 mg via ORAL
  Filled 2012-10-31: qty 40.6

## 2012-10-31 MED ORDER — ONDANSETRON HCL 4 MG/2ML IJ SOLN
INTRAMUSCULAR | Status: AC
  Filled 2012-10-31: qty 2

## 2012-10-31 MED ORDER — ROCURONIUM BROMIDE 50 MG/5ML IV SOLN
INTRAVENOUS | Status: AC
  Filled 2012-10-31: qty 1

## 2012-10-31 SURGICAL SUPPLY — 50 items
ABLATOR ENDOMETRIAL BIPOLAR (ABLATOR) ×2 IMPLANT
ADH SKN CLS APL DERMABOND .7 (GAUZE/BANDAGES/DRESSINGS) ×8
CABLE HIGH FREQUENCY MONO STRZ (ELECTRODE) ×5 IMPLANT
CANISTER SUCTION 2500CC (MISCELLANEOUS) ×14 IMPLANT
CATH ROBINSON RED A/P 16FR (CATHETERS) ×7 IMPLANT
CHLORAPREP W/TINT 26ML (MISCELLANEOUS) ×5 IMPLANT
CLOTH BEACON ORANGE TIMEOUT ST (SAFETY) ×5 IMPLANT
CONTAINER PREFILL 10% NBF 60ML (FORM) ×10 IMPLANT
DECANTER SPIKE VIAL GLASS SM (MISCELLANEOUS) ×4 IMPLANT
DERMABOND ADVANCED (GAUZE/BANDAGES/DRESSINGS) ×2
DERMABOND ADVANCED .7 DNX12 (GAUZE/BANDAGES/DRESSINGS) ×2 IMPLANT
DRAPE HYSTEROSCOPY (DRAPE) IMPLANT
DRESSING TELFA 8X3 (GAUZE/BANDAGES/DRESSINGS) ×5 IMPLANT
ELECT REM PT RETURN 9FT ADLT (ELECTROSURGICAL) ×5
ELECTRODE REM PT RTRN 9FT ADLT (ELECTROSURGICAL) ×1 IMPLANT
GLOVE BIO SURGEON STRL SZ7.5 (GLOVE) ×5 IMPLANT
GLOVE BIOGEL PI IND STRL 6.5 (GLOVE) ×2 IMPLANT
GLOVE BIOGEL PI IND STRL 7.0 (GLOVE) ×11 IMPLANT
GLOVE BIOGEL PI INDICATOR 6.5 (GLOVE) ×2
GLOVE BIOGEL PI INDICATOR 7.0 (GLOVE) ×5
GLOVE ECLIPSE 6.0 STRL STRAW (GLOVE) ×6 IMPLANT
GLOVE ECLIPSE 6.5 STRL STRAW (GLOVE) ×10 IMPLANT
GLOVE SURG SS PI 6.5 STRL IVOR (GLOVE) ×10 IMPLANT
GLOVE SURG SS PI 7.0 STRL IVOR (GLOVE) ×4 IMPLANT
GOWN PREVENTION PLUS LG XLONG (DISPOSABLE) ×29 IMPLANT
GOWN PREVENTION PLUS XLARGE (GOWN DISPOSABLE) ×9 IMPLANT
GOWN STRL REIN XL XLG (GOWN DISPOSABLE) ×14 IMPLANT
NDL SAFETY ECLIPSE 18X1.5 (NEEDLE) ×4 IMPLANT
NEEDLE HYPO 18GX1.5 SHARP (NEEDLE) ×5
NEEDLE INSUFFLATION 120MM (ENDOMECHANICALS) ×5 IMPLANT
NS IRRIG 1000ML POUR BTL (IV SOLUTION) ×5 IMPLANT
PACK HYSTEROSCOPY LF (CUSTOM PROCEDURE TRAY) ×5 IMPLANT
PACK LAPAROSCOPY BASIN (CUSTOM PROCEDURE TRAY) ×5 IMPLANT
PACK VAGINAL WOMENS (CUSTOM PROCEDURE TRAY) IMPLANT
PAD OB MATERNITY 4.3X12.25 (PERSONAL CARE ITEMS) ×5 IMPLANT
PLUG CATH AND CAP STER (CATHETERS) IMPLANT
PROTECTOR NERVE ULNAR (MISCELLANEOUS) ×7 IMPLANT
SEALER TISSUE G2 CVD JAW 35 (ENDOMECHANICALS) ×1 IMPLANT
SEALER TISSUE G2 CVD JAW 45CM (ENDOMECHANICALS) ×1
SET CYSTO W/LG BORE CLAMP LF (SET/KITS/TRAYS/PACK) ×5 IMPLANT
SET IRRIG TUBING LAPAROSCOPIC (IRRIGATION / IRRIGATOR) ×2 IMPLANT
SUT VIC AB 4-0 PS2 18 (SUTURE) ×2 IMPLANT
SUT VICRYL 0 UR6 27IN ABS (SUTURE) ×4 IMPLANT
SYR 20CC LL (SYRINGE) ×5 IMPLANT
SYR 30ML LL (SYRINGE) ×2 IMPLANT
TOWEL OR 17X24 6PK STRL BLUE (TOWEL DISPOSABLE) ×22 IMPLANT
TRAY FOLEY CATH 14FR (SET/KITS/TRAYS/PACK) ×10 IMPLANT
TROCAR XCEL NON-BLD 11X100MML (ENDOMECHANICALS) ×2 IMPLANT
TROCAR XCEL NON-BLD 5MMX100MML (ENDOMECHANICALS) ×4 IMPLANT
WARMER LAPAROSCOPE (MISCELLANEOUS) ×5 IMPLANT

## 2012-10-31 NOTE — Transfer of Care (Signed)
Immediate Anesthesia Transfer of Care Note  Patient: Willodean D Cruthis  Procedure(s) Performed: Procedure(s) with comments: DILATATION & CURETTAGE/HYSTEROSCOPY WITH NOVASURE ABLATION (N/A) - Dr Gildardo Griffes Diarmid to start case with a cysto at beginning of case while patient under anesthesia. LAPAROSCOPY OPERATIVE (Bilateral) CYSTOSCOPY/HYDRODISTENSION (N/A) BILATERAL SALPINGECTOMY (Bilateral)  Patient Location: PACU  Anesthesia Type:General  Level of Consciousness: awake, alert , oriented and patient cooperative  Airway & Oxygen Therapy: Patient Spontanous Breathing and Patient connected to nasal cannula oxygen  Post-op Assessment: Report given to PACU RN and Post -op Vital signs reviewed and stable  Post vital signs: stable  Complications: No apparent anesthesia complications

## 2012-10-31 NOTE — Anesthesia Preprocedure Evaluation (Addendum)
Anesthesia Evaluation  Patient identified by MRN, date of birth, ID band Patient awake    Reviewed: Allergy & Precautions, H&P , NPO status , Patient's Chart, lab work & pertinent test results, reviewed documented beta blocker date and time   History of Anesthesia Complications Negative for: history of anesthetic complications  Airway Mallampati: II TM Distance: >3 FB Neck ROM: full    Dental  (+) Teeth Intact   Pulmonary Current Smoker (occasional),  breath sounds clear to auscultation  Pulmonary exam normal       Cardiovascular Exercise Tolerance: Good Rhythm:regular Rate:Normal     Neuro/Psych  Headaches (reports headache), PSYCHIATRIC DISORDERS (depression/anxiety)    GI/Hepatic Neg liver ROS, GERD- (protonix and zantac)  Medicated,IBS, nausea   Endo/Other  negative endocrine ROS  Renal/GU negative Renal ROS  Female GU complaint (pelvic pain)     Musculoskeletal   Abdominal   Peds  Hematology negative hematology ROS (+)   Anesthesia Other Findings   Reproductive/Obstetrics negative OB ROS                          Anesthesia Physical Anesthesia Plan  ASA: II  Anesthesia Plan: General ETT   Post-op Pain Management:    Induction:   Airway Management Planned:   Additional Equipment:   Intra-op Plan:   Post-operative Plan:   Informed Consent: I have reviewed the patients History and Physical, chart, labs and discussed the procedure including the risks, benefits and alternatives for the proposed anesthesia with the patient or authorized representative who has indicated his/her understanding and acceptance.   Dental Advisory Given  Plan Discussed with: CRNA and Surgeon  Anesthesia Plan Comments:         Anesthesia Quick Evaluation

## 2012-10-31 NOTE — Anesthesia Postprocedure Evaluation (Signed)
  Anesthesia Post Note  Patient: [name] [name]  Procedure(s) Performed: Procedure(s) (LRB): DILATATION & CURETTAGE/HYSTEROSCOPY WITH NOVASURE ABLATION (N/A) LAPAROSCOPY OPERATIVE (Bilateral) CYSTOSCOPY/HYDRODISTENSION (N/A) BILATERAL SALPINGECTOMY (Bilateral)  Anesthesia type: GA  Patient location: PACU  Post pain: Pain level controlled  Post assessment: Post-op Vital signs reviewed  Last Vitals:  Filed Vitals:   10/31/12 1415  BP: 105/65  Pulse: 84  Temp: 36.7 C  Resp: 18    Post vital signs: Reviewed  Level of consciousness: sedated  Complications: No apparent anesthesia complications

## 2012-10-31 NOTE — Op Note (Signed)
10/31/2012  1:07 PM  PATIENT:  [name] [name]  42 y.o. female  PRE-OPERATIVE DIAGNOSIS:  menorrhagia, pelvic pain, rule out intersitial cystitis, adnexal mass  POST-OPERATIVE DIAGNOSIS:  menorrhagia, pelvic pain, right fallopian tube cyst, desires sterilization  PROCEDURE:  CYSTOSCOPY/HYDRODISTENSION (N/A) by Dr. Sherron Monday LAPAROSCOPY OPERATIVE (Bilateral) BILATERAL SALPINGECTOMY (Bilateral)DILATATION & CURETTAGE/HYSTEROSCOPY WITH NOVASURE ABLATION (N/A)  SURGEON:  Surgeon(s) and Role: Panel 1:    * Annamaria Boots, MD - Primary    * Alison Murray, MD - Assisting  Panel 2:    * Martina Sinner, MD - Primary  ASSISTANTS: ROMINE, CYNTHIA   ANESTHESIA:   general  EBL:  Total I/O In: 1800 [I.V.:1800] Out: 325 [Urine:300; Blood:25]  BLOOD ADMINISTERED:none  DRAINS: none   LOCAL MEDICATIONS USED:  LIDOCAINE  and Amount: 8 ml  SPECIMEN:  Source of Specimen:  Bilateral fallopian tubes  DISPOSITION OF SPECIMEN:  PATHOLOGY  COUNTS:  YES  DICTATION: Patient is taken to the operating room. She is placed in the supine position. SCDs were on her lower extremities and functioning properly. Informed consent was present on the chart. Running IV was in place and functioning properly. General endotracheal anesthesia was administered by the anesthesia staff without difficulty. Dr. Rodman Pickle oversaw case. Once adequate anesthesia was confirmed the legs are placed in the [place] stirrups in the low lithotomy position. These were elevated slightly and a skin preparation of chlor prep on the abdomen and thighs and Betadine prep on the perineum and vagina was performed.  After 3 minutes had past the patient was draped in a normal standard fashion.  Dr. Lorayne Marek was present at this point and he performed a cystoscopy. There was some minor findings consistent with interstitial cystitis and after hydrodistention was complete, I took over the procedure. Dr. Rodney Booze dictation is separate  from this point. A Foley catheter was in place to straight drain. Pink urine was noted which is expected after hydrodistention.  Speculum was placed in the vagina. The anterior lip of the cervix was grasped with single-tooth tenaculum. A paracervical block of 1% lidocaine mixed one-to-one with epinephrine (1:100,000 units) was used. 8 cc total was used. The uterus sounded to 7 and 1/2 cm. A Hulka clamp was advanced to the cervical canal and attached to the anterior lip of the cervix to manipulate the uterus during the laparoscopic portion of the procedure. The single-tooth tenaculum and the speculum were removed.  Legs were lowered to the low lithotomy position and attention was turned abdomen. Quarter percent Marcaine was used to anesthetize the skin the need the umbilicus. The skin was incised with #11 blade. The abdomen was elevated and a varies needle was inserted directly. The peritoneum was felt as a pop. A syringe of normal saline was attached to the needle. An aspiration was performed. No blood or fluid was noted. Fluid was injected into the needle. A second aspiration was performed. No blood, fluid, or saline was noted. Then fluid dripped easily into the needle. Gas was attached to the needle.  Under low flow of  CO2 gas,  a pneumoperitoneum was achieved. Once 3 L was in the abdomen, A #11 non-bladed trocar port were passed through the umbilical incision. An Optiview trocar was used. Once intra-abdominal placement was noted the trocar was removed. The laparoscope was used to transilluminated the tissue in the right and left lower quadrants. Vessels were noted. The incision locate since 5 mm trochars and ports on each side were determined. Percent Marcaine was used  to anesthetize the skin. 5 mm skin incisions were made with a #11 blade. 5 mm non-bladed trocar ports were passed into the abdomen with direct visualization of the laparoscope. The trochars were removed. The patient was then placed in  Trendelenburg positioning. The upper abdomen was surveyed. No abnormality were noted except abdomen was a little bit inflated. An OG tube was passed and gastric liquid was removed. This decompressed the bladder. The liver edge was normal. Falciform ligament was normal.  Gallbladder was normal. There is no evidence of endometriosis. Both ovaries appeared normal. The right ovary had no abnormal findings. The entire ovary was less than 3 cm incised. The left ovary had what appeared to be a 1-1/2 cm corpus luteal cyst. The left fallopian tube was adhesed to the pelvic sidewall with filmy adhesions in the right fallopian tube contained a paratubal cyst. The patient did desire sterilization and as I plan to the remove the right tube and a right ovary she also decided that we would remove the left tube instead of a tubal ligation procedure.  The left tube was elevated after identifying the ureter. It was dissected with sharp dissection off the sidewall. Then using an Enseal device the tube was excised off the ovary and mesosalpinx was divided. At the level of the cornua the tube was excised. It was delivered through a laparoscopic ports. Then in a similar fashion the right ureter was identified and the tube was excised off of the ovary using Enseal device. Then the mesosalpinx was divided freeing the tube and the tube was excised from the cornea of the uterus. Again this tube was delivered through a laparoscope port.  At this point the laparoscopic portion of the procedure was completed.  Attention was turned to the vagina. The laparoscope was left in place so that the cornua could be visualized as they hysteroscopy was being performed. The Trendelenburg positioning was relieved slightly A bivalve speculum was placed back in the vagina. The anterior lip of the cervix was regrasped with a single-tooth tenaculum after the Hulka clamp was removed. The cervix is dilated up to a #8 Hegar dilator. The cervix measured 3 and  half centimeters in the cavity was therefore 4 cm. A diagnostic hysteroscope was used to visualize endometrial cavity and photodocumentation was made. Then the NovaSure device was obtained. The array was opened outside the patient. This was functioning properly. The device was set on a cavity setting 4 cm. The device was passed into the endocervical canal to the endometrial cavity. The array was opened. The device was seated. The device was moved up and down to the right and left and twisted 90 to the right and left. The cavity width was 3.5. This was placed into the NovaSure device and the power setting was 77. The cavity assessment test was performed and passed without difficulty. The NovaSure cycle was initiated and lasted a minute 47 seconds. During the entire time of this procedure the uterus was lifted out of the pelvis and the cornua of the uterus was noted.  There was never any blanching of this portion of the uterus. No hysteroscopic fluid came out the cornea of the tubes. Once the ablation cycle was complete the NovaSure device was removed from the endometrial cavity. Hysteroscope was used to visualize the cavity and good cauterization of the entire Is noted. At this point the vaginal portion of the procedure was ended. All instruments were removed from the vagina and no bleeding was noted.  Attention  was turned back to the abdomen. The area prior dissection was irrigated. No bleeding was noted. Photodocumentation was made. The inferior trochars were removed without difficulty. This was done under direct visualization of the laparoscope. The bladder scope was removed. The pneumoperitoneum was relieved. The patient was taken completely out of Trendelenburg. The patient was given several deep breaths before the midline port was removed. The fascia was closed in the midline with figure-of-eight suture #0 Vicryl. The skin was closed with a 3-0 Vicryl subcuticular stitch. The incisions were cleansed  Dermabond was applied. Before the case was ended the pyridium mixture prescribed by Dr. Jacquelyne Balint was injected into Foley catheter and into the patient's bladder and the catheter was removed.  Legs are positioned back in the supine position.  Sponge, lap, needle, initially counts were correct x2. Patient tolerated procedure well. She was awake from anesthesia and extubated without difficulty. She was taken to the recovery in stable condition.  PLAN  OF CARE: Discharge to home after PACU  PATIENT DISPOSITION:  PACU - hemodynamically stable.   Delay start of Pharmacological VTE agent (>24hrs) due to surgical blood loss or risk of bleeding: no

## 2012-10-31 NOTE — H&P (Signed)
[name] [name] is an 42 y.o. female [id number] Separated WF with chronic RLQ pain, menorrhagia, and desired sterilization here for laparoscopic RSO, left salpingectomy, endometrial ablation, and cystoscopy with Dr. Sherron Monday.  She has seen Dr. Sherron Monday for outpatient consultation.  Ultrasound done 4/22 showed a 3 cm solid mass with increased doppler flow to periphery of this ovary.  Ca-125 was normal at 8.3.  Endometrial biopsy in-office was also normal showing atrophic endometrium.  Procedure, risks, benefits all previously discussed with patient and documented in electronic record.  Pertinent Gynecological History: Menses: heavy lasting 5-6 days Bleeding: menorrhagia Contraception: condoms DES exposure: denies Blood transfusions: none Sexually transmitted diseases: no past history Previous GYN Procedures: none  Last mammogram: normal Date: 10/12 Last pap: normal Date: 2013   Menstrual History: No LMP recorded.    Past Medical History  Diagnosis Date  . Depression   . Anxiety   . IBS (irritable bowel syndrome)   . GERD (gastroesophageal reflux disease)     Past Surgical History  Procedure Laterality Date  . Cesarean section    . Dilation and curettage of uterus    . Endometrial biopsy  09/19/12    neg    Family History  Problem Relation Age of Onset  . Stroke Father   . Club foot Son   . Non-Hodgkin's lymphoma Father   . Hypertension Mother   . Hypertension Father     Social History:  reports that she has quit smoking. She has never used smokeless tobacco. She reports that  drinks alcohol. She reports that she does not use illicit drugs.  Allergies:  Allergies  Allergen Reactions  . Ciprofloxacin Nausea Only    Prescriptions prior to admission  Medication Sig Dispense Refill  . ibuprofen (ADVIL,MOTRIN) 200 MG tablet Take 400 mg by mouth every 6 (six) hours as needed for pain.      Marland Kitchen ALPRAZolam (XANAX) 0.5 MG tablet Take 0.5 mg by mouth at bedtime.       Marland Kitchen  ibuprofen (ADVIL,MOTRIN) 800 MG tablet Take 1 tablet (800 mg total) by mouth every 8 (eight) hours as needed for pain.  30 tablet  0  . ondansetron (ZOFRAN) 4 MG tablet Take 4 mg by mouth daily as needed for nausea.       Marland Kitchen oxyCODONE-acetaminophen (PERCOCET) 5-325 MG per tablet Take 2 tablets by mouth every 4 (four) hours as needed for pain. use only as much as needed to relieve pain  20 tablet  0  . pantoprazole (PROTONIX) 40 MG tablet Take 40 mg by mouth daily.       . ranitidine (ZANTAC) 150 MG tablet Take 150 mg by mouth daily as needed.       . traMADol (ULTRAM) 50 MG tablet Take 50 mg by mouth every 6 (six) hours as needed for pain.        Review of Systems  Constitutional: Negative for fever and chills.  Respiratory: Negative for shortness of breath.   Cardiovascular: Negative for chest pain.  Gastrointestinal: Positive for abdominal pain (right lower quadrant). Negative for nausea, diarrhea and constipation.  Genitourinary: Negative for dysuria and urgency.  Neurological: Negative for headaches.    Blood pressure 121/79, pulse 83, temperature 97.9 F (36.6 C), temperature source Oral, resp. rate 18, SpO2 100.00%. Physical Exam  Constitutional: She appears well-developed and well-nourished.  HENT:  Head: Normocephalic and atraumatic.  Cardiovascular: Normal rate and regular rhythm.   Respiratory: Effort normal and breath sounds normal.  GI: Soft. Bowel  sounds are normal.    No results found for this or any previous visit (from the past 24 hour(s)).  No results found.  Assessment/Plan: 42 year old [id number] Separated WF here for definitive treatment of RLQ pain and solid adnexal mass with laparoscopic RSO.  I am planning on removing her left tube as well for pregnancy prevention in the future.  Finally, endometrial ablation is planned for treatment of menorrhagia.  Dr. Sherron Monday will also perform a cystoscopy to definitely rule out interstitial cystitis.  Valentina Shaggy  SUZANNE 10/31/2012, 10:20 AM

## 2012-10-31 NOTE — Op Note (Signed)
Preoperative diagnosis: Pelvic pain Postoperative diagnosis: Pelvic pain Surgery: Cystoscopy bladder hydrodistention Surgeon: Dr. Lorin Picket Torren Maffeo  The patient has the above diagnoses and consented to the above procedure. She was undergoing a laparoscopy by Dr. Hyacinth Meeker  With the patient in lithotomy position and after preoperative antibiotics were given she underwent cystoscopy.  21 [place] scope was utilized. Bladder mucosa and trigone were normal. Is no stitch or foreign body or carcinoma. The bladder was hydrodistended to 700 mL.  After inking the bladder re\re cystoscoped the patient. Trigone and bladder were normal. There were a few petechiae at 5 and 7:00 but not enough to firm up the diagnosis of interstitial cystitis.  Bladder was emptied.

## 2012-11-01 ENCOUNTER — Encounter (HOSPITAL_COMMUNITY): Payer: Self-pay | Admitting: Obstetrics & Gynecology

## 2012-11-03 ENCOUNTER — Telehealth: Payer: Self-pay | Admitting: *Deleted

## 2012-11-03 NOTE — Telephone Encounter (Signed)
Call to patient to check on her post op.  States she is doing very well except for gas pain, abdominal swelling slightly better today, shoulder still has sharp pain.  She is walking.  Discussed other methods to help with gas pain, warm liquids, knee chest, heating pad with precautions.  Advised path report normal with no evidence of endometriosis or cancer.  Call prn.  Has post op scheduled.

## 2012-11-07 ENCOUNTER — Telehealth: Payer: Self-pay

## 2012-11-07 NOTE — Telephone Encounter (Signed)
Patient aware of change in appointment time. She was not able to come in at 830, but could at 1100 next Friday. States started having a watery d/c yesterday and today a little pink tinge was in it. Patient aware this is normal, but will inform Dr Hyacinth Meeker. She will call back if becomes worse or any fever or pain. Also was asking about [place] getting her an appointment with Dr Loreta Ave. States she would like to have this before 01/29/13 due to insurance and meeting her deductible. Please advise.

## 2012-11-07 NOTE — Telephone Encounter (Signed)
6/10 lmtcb//kn 

## 2012-11-07 NOTE — Telephone Encounter (Signed)
Patient is returning your call.  

## 2012-11-09 ENCOUNTER — Ambulatory Visit: Payer: Self-pay | Admitting: Obstetrics & Gynecology

## 2012-11-09 NOTE — Telephone Encounter (Signed)
Patient appointment has been rescheduled

## 2012-11-17 ENCOUNTER — Ambulatory Visit (INDEPENDENT_AMBULATORY_CARE_PROVIDER_SITE_OTHER): Payer: 59 | Admitting: Obstetrics & Gynecology

## 2012-11-17 VITALS — BP 118/72 | HR 60 | Resp 16 | Ht 63.5 in | Wt 130.4 lb

## 2012-11-17 DIAGNOSIS — R109 Unspecified abdominal pain: Secondary | ICD-10-CM

## 2012-11-17 NOTE — Progress Notes (Signed)
Post Operative Visit  Procedure: laparoscopic bilateral salpingectomy, Novasure ablation Days Post-op: 17  Subjective: Here for post-op from above procedure.  She is feeling crampy today like she may be getting ready to start cycle.  She did have watery discharge for several days.  Better now.  Still with lots of nausea and diarrhea.  Ready for colonoscopy.  Objective: BP 118/72  Pulse 60  Resp 16  Ht 5' 3.5" (1.613 m)  Wt 130 lb 6.4 oz (59.149 kg)  BMI 22.73 kg/m2  LMP 11/06/2012  EXAM General: alert and cooperative Resp: clear to auscultation bilaterally Cardio: regular rate and rhythm, S1, S2 normal, no murmur, click, rub or gallop GI: soft, non-tender; bowel sounds normal; no masses,  no organomegaly and incision: clean, dry and intact Extremities: extremities normal, atraumatic, no cyanosis or edema Vaginal Bleeding: none Gyn:  Cervix closed, No CMT  Assessment: s/p laparoscopic salpingectomy, Novasure ablation Cystoscopy was not clearly diagnostic for I.C.--saw Dr. Sherron Monday 6/17  Plan: Recheck for AEX or prn Refer to Dr. Loreta Ave

## 2012-11-17 NOTE — Patient Instructions (Signed)
Please call if you need anything

## 2012-11-18 ENCOUNTER — Encounter: Payer: Self-pay | Admitting: Obstetrics & Gynecology

## 2012-11-21 ENCOUNTER — Telehealth: Payer: Self-pay | Admitting: Orthopedic Surgery

## 2012-11-21 NOTE — Telephone Encounter (Signed)
LMTCB about upcoming appt.  aa    (Need to tell her about appt with Dr. Loreta Ave 11-28-12 at 3 pm for consultation. Phone [phone]. [facility] [address])

## 2012-11-27 ENCOUNTER — Telehealth: Payer: Self-pay | Admitting: Orthopedic Surgery

## 2012-11-27 ENCOUNTER — Other Ambulatory Visit: Payer: Self-pay

## 2012-11-27 DIAGNOSIS — Z1231 Encounter for screening mammogram for malignant neoplasm of breast: Secondary | ICD-10-CM

## 2012-11-27 NOTE — Telephone Encounter (Signed)
Spoke with pt about appt. Pt aware. Dr. Kenna Gilbert office contacted her.

## 2012-11-28 ENCOUNTER — Other Ambulatory Visit: Payer: Self-pay | Admitting: Gastroenterology

## 2012-11-28 DIAGNOSIS — K219 Gastro-esophageal reflux disease without esophagitis: Secondary | ICD-10-CM

## 2012-11-28 DIAGNOSIS — R1031 Right lower quadrant pain: Secondary | ICD-10-CM

## 2012-12-11 ENCOUNTER — Encounter (HOSPITAL_COMMUNITY)
Admission: RE | Admit: 2012-12-11 | Discharge: 2012-12-11 | Disposition: A | Discharge status: Home / Self Care | Payer: 59 | Source: Ambulatory Visit | Attending: Gastroenterology | Admitting: Gastroenterology

## 2012-12-11 DIAGNOSIS — R1031 Right lower quadrant pain: Secondary | ICD-10-CM | POA: Insufficient documentation

## 2012-12-11 DIAGNOSIS — K219 Gastro-esophageal reflux disease without esophagitis: Secondary | ICD-10-CM | POA: Insufficient documentation

## 2012-12-11 IMAGING — NM NM HEPATO W/GB/PHARM/[PERSON_NAME]
3 series · 13 of 13 positions shown · non-contrast
Comparison: None.

CLINICAL DATA: Right lower quadrant and left lower quadrant pain

NUCLEAR MEDICINE HEPATOBILIARY IMAGING WITH GALLBLADDER EF
TECHNIQUE: Sequential images of the abdomen were obtained [DATE] minutes following intravenous administration of
radiopharmaceutical. After oral ingestion of Ensure, gallbladder
ejection fraction was determined.
Radiopharmaceutical:  5.0 mCi [GV] Choletec

[Series 0: hepatobiliary · 3.20mm/px · 6 of 55 frames shown (1 of 3)]
[frame 5/55]
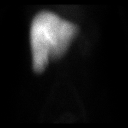
[frame 14/55]
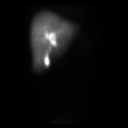
[frame 23/55]
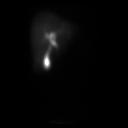
[frame 32/55]
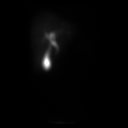
[frame 41/55]
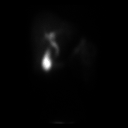
[frame 51/55]
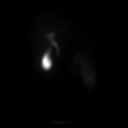

[Series 0: hepatobiliary · 1 of 1 slices shown (2 of 3)]
[im 1/1]
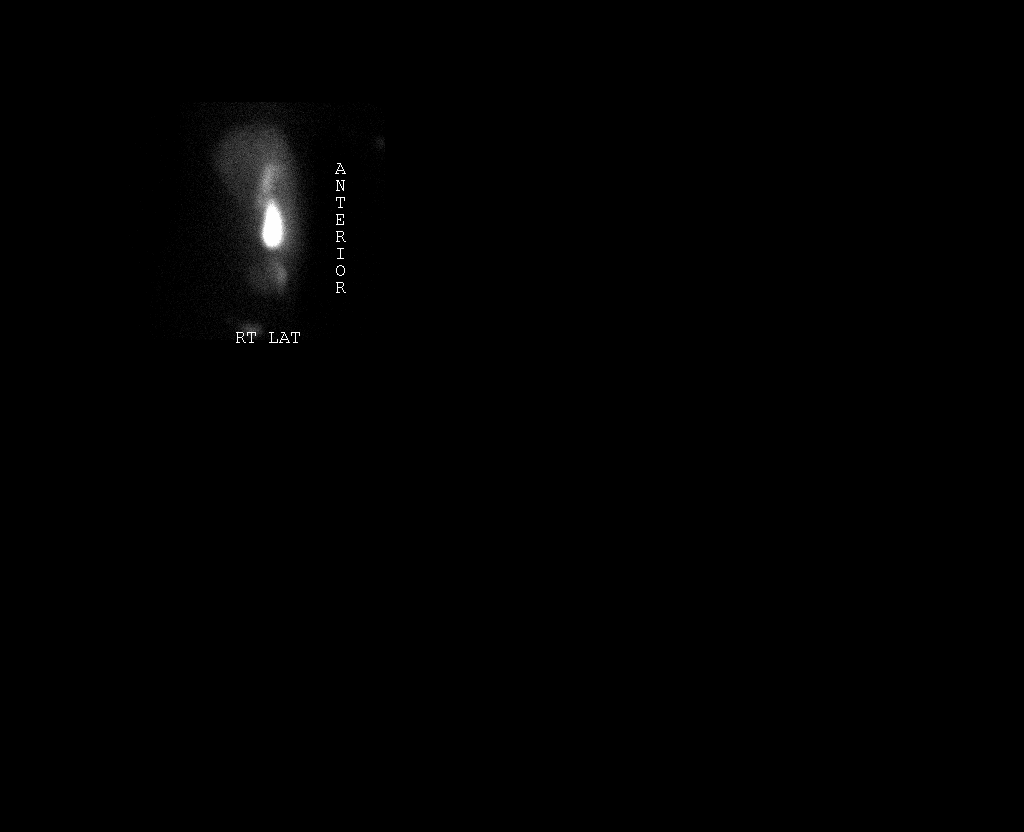

[Series 0: hepatobiliary · 3.20mm/px · 6 of 60 frames shown (3 of 3)]
[frame 6/60]
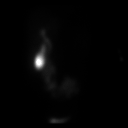
[frame 16/60]
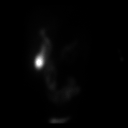
[frame 26/60]
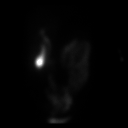
[frame 36/60]
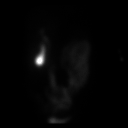
[frame 46/60]
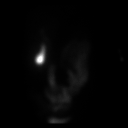
[frame 56/60]
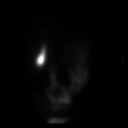

[13 of 13 positions shown; findings below may reference images not displayed]

FINDINGS: There is normal uptake of radiotracer by the liver.
Counts are present within the gallbladder by 15 minutes.  Counts
enter the small bowel at 40 minutes.  Oral ingestion of fatty meal
resulted in contraction of the gallbladder with a calculated
ejection fraction equal 37.4%.

Gallbladder ejection fraction:  37.4%. Normal gallbladder ejection
fraction with Ensure is greater than 33%.
IMPRESSION: 1..  Gallbladder ejection fraction within normal limits.
2.  Patent cystic duct and common bile duct.

## 2012-12-11 MED ORDER — TECHNETIUM TC 99M MEBROFENIN IV KIT
5.0000 | PACK | Freq: Once | INTRAVENOUS | Status: AC | PRN
  Administered 2012-12-11: 5 via INTRAVENOUS

## 2012-12-15 ENCOUNTER — Ambulatory Visit
Admission: RE | Admit: 2012-12-15 | Discharge: 2012-12-15 | Disposition: A | Discharge status: Home / Self Care | Payer: 59 | Source: Ambulatory Visit

## 2012-12-15 DIAGNOSIS — Z1231 Encounter for screening mammogram for malignant neoplasm of breast: Secondary | ICD-10-CM

## 2012-12-19 ENCOUNTER — Other Ambulatory Visit: Payer: Self-pay | Admitting: Obstetrics & Gynecology

## 2012-12-19 DIAGNOSIS — R928 Other abnormal and inconclusive findings on diagnostic imaging of breast: Secondary | ICD-10-CM

## 2012-12-28 ENCOUNTER — Encounter (INDEPENDENT_AMBULATORY_CARE_PROVIDER_SITE_OTHER): Payer: Self-pay | Admitting: Surgery

## 2013-01-01 LAB — HM COLONOSCOPY: HM Colonoscopy: NORMAL

## 2013-01-02 ENCOUNTER — Other Ambulatory Visit: Payer: Self-pay | Admitting: Obstetrics & Gynecology

## 2013-01-02 ENCOUNTER — Ambulatory Visit
Admission: RE | Admit: 2013-01-02 | Discharge: 2013-01-02 | Disposition: A | Discharge status: Home / Self Care | Payer: 59 | Source: Ambulatory Visit | Attending: Obstetrics & Gynecology | Admitting: Obstetrics & Gynecology

## 2013-01-02 DIAGNOSIS — R928 Other abnormal and inconclusive findings on diagnostic imaging of breast: Secondary | ICD-10-CM

## 2013-01-02 IMAGING — US US BREAST*L*
1 series · 10 of 10 positions shown · non-contrast
Comparison: Previous mammograms.

***ADDENDUM*** CREATED: [DATE] [DATE]

The patient returned today for aspiration of the 7mm cyst in the 9
o'clock position of the left breast.  In talking with the patient,
it became clear that her pain and primary area of concern is in the
lateral left breast.  Her pain is waxing and waning, consistent
with fibrocystic change.  I examined the lateral left breast and on
clinical exam, this area palpates consistent with fibrocystic
change.  Ultrasound of this area confirmed this, with no suspicious
findings or dominant cysts.  In the 9 o'clock position, the 7mm
simple cyst is stable.  On ultrasound today, it is entirely
anechoic and simple.  I explained to the patient that aspiration of
this small cyst in the medial breast would be very unlikely to
alleviate the symptoms she is describing.  I discussed with her the
causes and treatments for breast pain, and we agreed not to proceed
with cyst aspiration.
Addended by:  BORKICA, M.D. on [DATE] [DATE].
***END ADDENDUM*** SIGNED BY: BORKICA, M.D.
CLINICAL DATA: Screening recall for possible left breast mass.
DIGITAL DIAGNOSTIC UNILATERAL LEFT MAMMOGRAM  AND LEFT BREAST
ULTRASOUND:

[Series 1: us breast*left* · 10 of 10 slices shown]
[im 1/10]
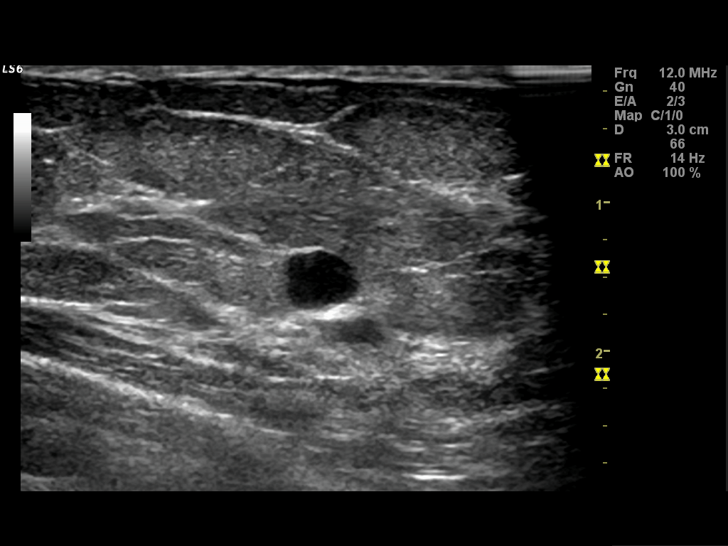
[im 2/10]
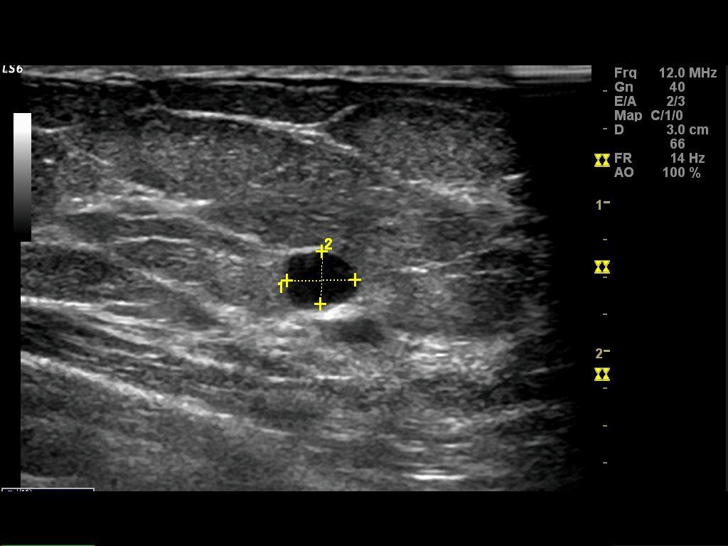
[im 3/10]
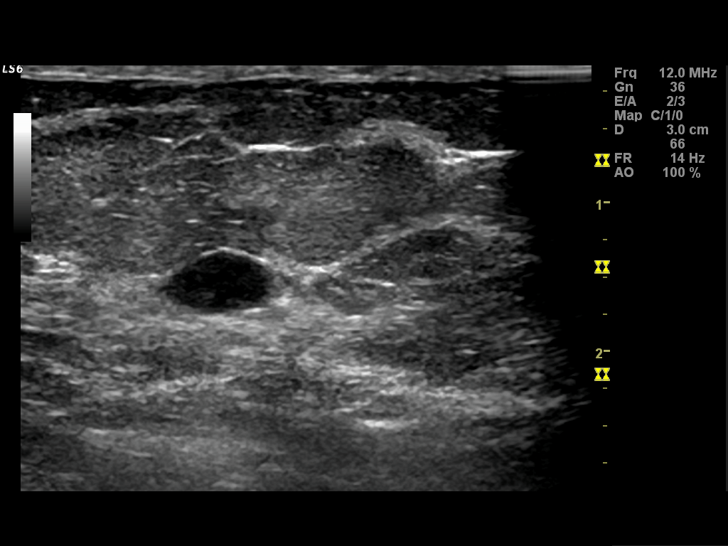
[im 4/10]
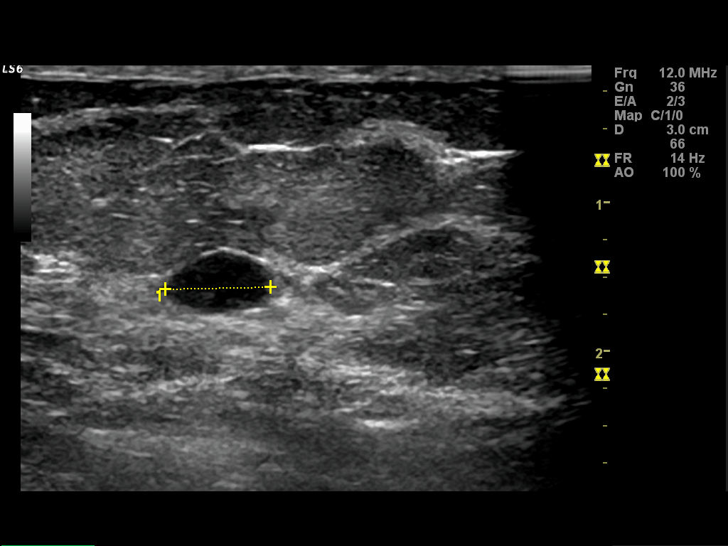
[im 5/10]
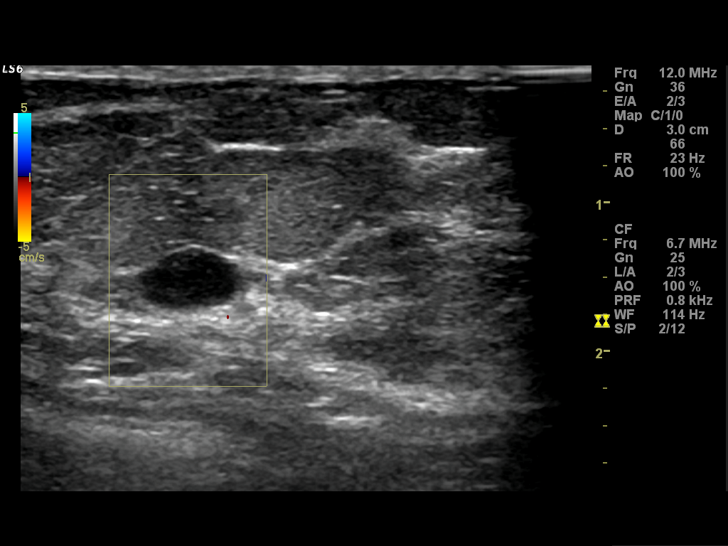
[im 6/10]
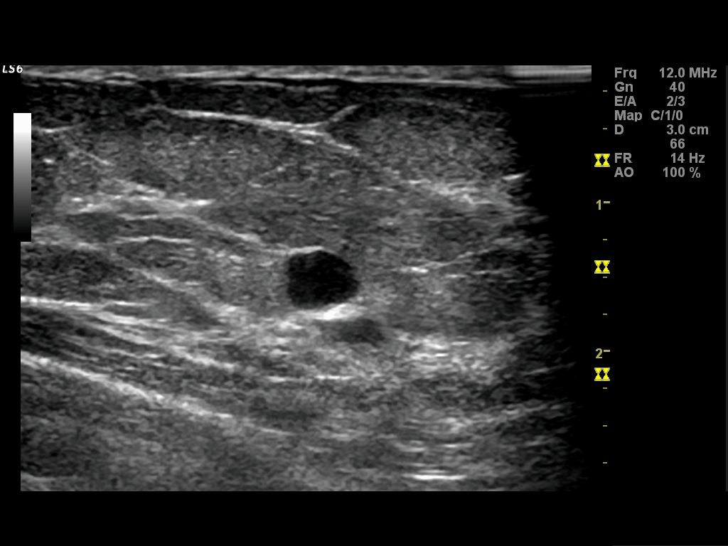
[im 7/10]
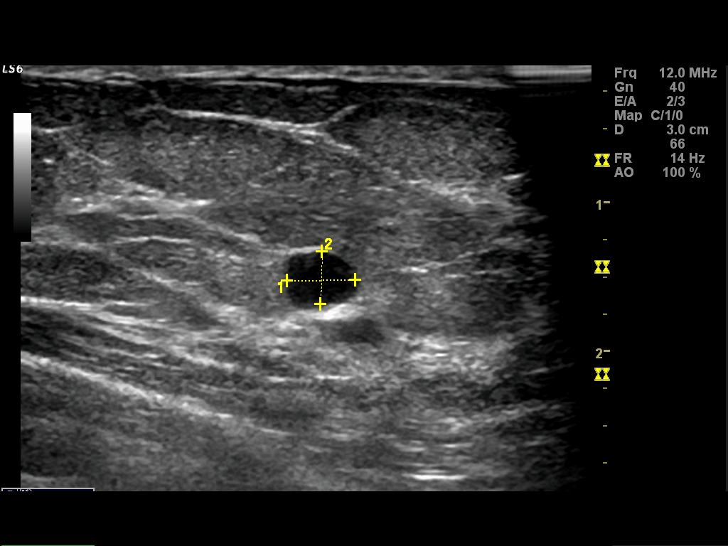
[im 8/10]
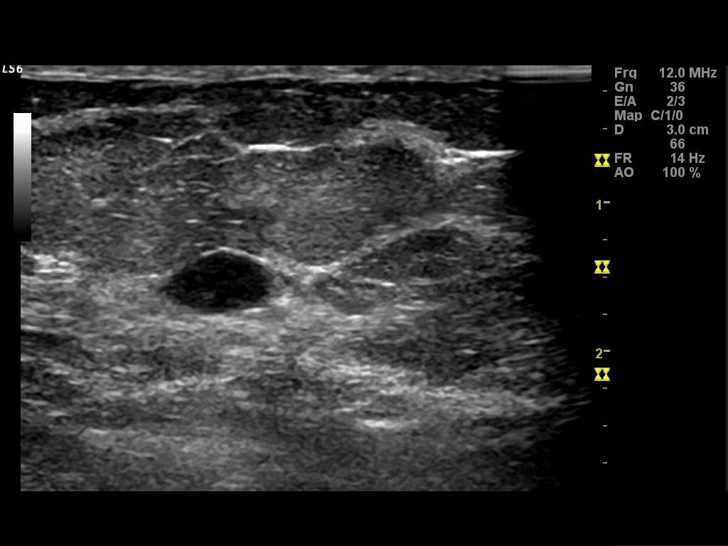
[im 9/10]
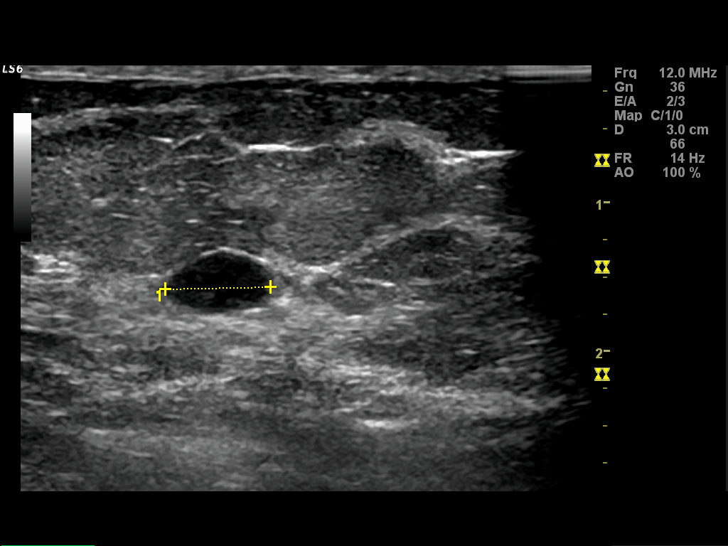
[im 10/10]
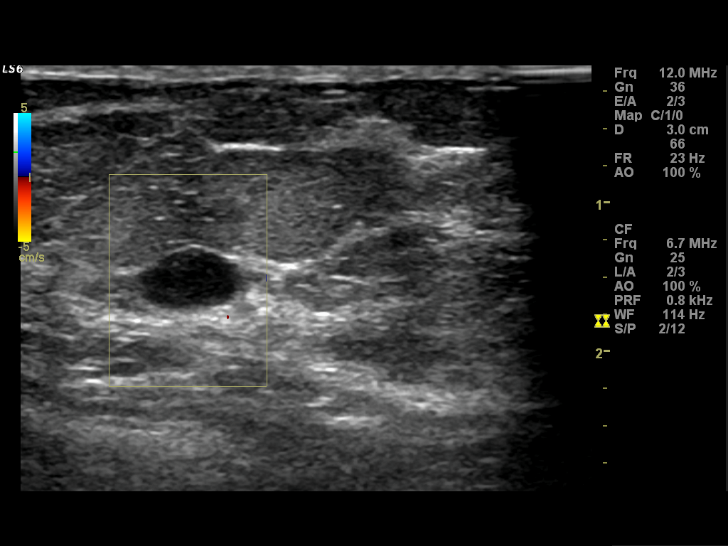

[10 of 10 positions shown; findings below may reference images not displayed]

FINDINGS: ACR Breast Density Category c:  The breast tissue is
heterogeneously dense, which may obscure small masses.

There is a mass present in the slightly inner left breast at the
approximate 8 o'clock position measuring 8 mm, confirmed on the
additional spot compression images.

Physical examination of the inner left breast does not reveal any
palpable abnormalities.

Targeted ultrasound of the left breast was performed.  There is an
oval well circumscribed anechoic mass in the left breast compatible
with a simple cyst.  This corresponds with mammography findings.
IMPRESSION: Left breast cyst, which corresponds with mammography findings.

RECOMMENDATION:
1. Ultrasound-guided cyst aspiration will be performed, as per the
patient's request, as she reports this area is tender/painful.
This is scheduled for [DATE] at [DATE] a.m..
2. Screening mammogram in one year. (Code:[DB])

I have discussed the findings and recommendations with the patient.
Results were also provided in writing at the conclusion of the
visit.  If applicable, a reminder letter will be sent to the
patient regarding the next appointment.

BI-RADS CATEGORY 2:  Benign finding(s).

## 2013-01-03 ENCOUNTER — Ambulatory Visit (INDEPENDENT_AMBULATORY_CARE_PROVIDER_SITE_OTHER): Payer: 59 | Admitting: General Surgery

## 2013-01-03 ENCOUNTER — Encounter (INDEPENDENT_AMBULATORY_CARE_PROVIDER_SITE_OTHER): Payer: Self-pay | Admitting: General Surgery

## 2013-01-03 VITALS — BP 118/68 | HR 68 | Temp 97.8°F | Resp 14 | Ht 63.5 in | Wt 130.6 lb

## 2013-01-03 DIAGNOSIS — R109 Unspecified abdominal pain: Secondary | ICD-10-CM | POA: Insufficient documentation

## 2013-01-03 DIAGNOSIS — R110 Nausea: Secondary | ICD-10-CM | POA: Insufficient documentation

## 2013-01-03 NOTE — Progress Notes (Signed)
Patient ID: [name] [name], female   DOB: [birth year], 42 y.o.   MRN: [id number]  Chief Complaint  Patient presents with  . New Evaluation    eval Gerd vs Gb    HPI [name] [name] is a 42 y.o. female.   HPI  She is referred by Dr. Loreta Ave for further evaluation of persistent nausea. She also has lower abdominal pain. She has borderline biliary dyskinesia. Ultrasound demonstrates no gallstones. She also has gastroesophageal reflux disease. Recent upper endoscopy demonstrated chronic diffuse gastritis. This is despite being on proton pump inhibitor therapy. She's had significant problems with this since the beginning of the year. She's been losing some weight because she is afraid to eat secondary to the nausea. She doesn't actually vomit. She also some problems with constipation at times.  She reports to me that she has not had any upper abdominal pain although she told Dr. Loreta Ave she was having some right upper quadrant pain at times.  She is here with her mother.  Past Medical History  Diagnosis Date  . Depression   . Anxiety   . IBS (irritable bowel syndrome)   . GERD (gastroesophageal reflux disease)     Past Surgical History  Procedure Laterality Date  . Cesarean section    . Dilation and curettage of uterus    . Endometrial biopsy  09/19/12    neg  . Dilitation & currettage/hystroscopy with novasure ablation N/A 10/31/2012    Procedure: DILATATION & CURETTAGE/HYSTEROSCOPY WITH NOVASURE ABLATION;  Surgeon: Annamaria Boots, MD;  Location: [facility];  Service: Gynecology;  Laterality: N/A;  Dr Gildardo Griffes Diarmid to start case with a cysto at beginning of case while patient under anesthesia.  . Laparoscopy Bilateral 10/31/2012    Procedure: LAPAROSCOPY OPERATIVE;  Surgeon: Annamaria Boots, MD;  Location: [facility];  Service: Gynecology;  Laterality: Bilateral;  . Bilateral salpingectomy Bilateral 10/31/2012    Procedure: BILATERAL SALPINGECTOMY;  Surgeon: Annamaria Boots, MD;  Location: [facility];   Service: Gynecology;  Laterality: Bilateral;  . Cysto with hydrodistension N/A 10/31/2012    Procedure: CYSTOSCOPY/HYDRODISTENSION;  Surgeon: Martina Sinner, MD;  Location: [facility];  Service: Urology;  Laterality: N/A;    Family History  Problem Relation Age of Onset  . Stroke Father   . Non-Hodgkin's lymphoma Father   . Hypertension Father   . Kidney disease Father   . Club foot Son   . Hypertension Mother     Social History History  Substance Use Topics  . Smoking status: Current Every Day Smoker -- 1.00 packs/day  . Smokeless tobacco: Never Used  . Alcohol Use: Yes     Comment: occasionally    Allergies  Allergen Reactions  . Ciprofloxacin Nausea Only    Current Outpatient Prescriptions  Medication Sig Dispense Refill  . ALPRAZolam (XANAX) 0.5 MG tablet Take 0.5 mg by mouth at bedtime.       Marland Kitchen Dexlansoprazole 30 MG capsule Take 30 mg by mouth daily.      . ondansetron (ZOFRAN) 4 MG tablet Take 4 mg by mouth daily as needed for nausea.       Marland Kitchen ibuprofen (ADVIL,MOTRIN) 800 MG tablet Take 1 tablet (800 mg total) by mouth every 8 (eight) hours as needed for pain.  30 tablet  0   No current facility-administered medications for this visit.    Review of Systems Review of Systems  Constitutional: Positive for chills. Negative for fever.  HENT: Positive for congestion and trouble swallowing.  Respiratory: Negative.   Cardiovascular: Negative.   Gastrointestinal: Positive for nausea and constipation.  Genitourinary: Negative.   Neurological: Negative.   Hematological: Negative.     Blood pressure 118/68, pulse 68, temperature 97.8 F (36.6 C), temperature source Temporal, resp. rate 14, height 5' 3.5" (1.613 m), weight 130 lb 9.6 oz (59.24 kg).  Physical Exam Physical Exam  Constitutional: She appears well-developed and well-nourished. No distress.  HENT:  Head: Normocephalic and atraumatic.  Eyes: EOM are normal. No scleral icterus.  Neck: Neck supple.   Cardiovascular: Normal rate and regular rhythm.   Pulmonary/Chest: Effort normal and breath sounds normal.  Abdominal: Soft. She exhibits no distension and no mass. There is no tenderness.  Musculoskeletal: She exhibits no edema.  Lymphadenopathy:    She has no cervical adenopathy.  Neurological: She is alert.  Skin: Skin is warm and dry.  Psychiatric: She has a normal mood and affect. Her behavior is normal.    Data Reviewed Ultrasound report. Nuclear medicine scan report. Upper endoscopy report. Colonoscopy report. Notes from Dr. Loreta Ave.  Assessment    Chronic nausea with borderline gallbladder dysfunction/biliary dyskinesia. Also has chronic gastritis. We had a long discussion about laparoscopic cholecystectomy and the success rate of helping with her symptoms. I told her given the multiple things going on, I could not quote her a success rate with respect to cholecystectomy helping her symptoms. We talked about the fact that it could be diagnostic or diagnostic and therapeutic.     Plan    I recommended she go home and discuss it with her family and think about it some more. I've asked her to call back if she would like to proceed with surgery.  If she decides to proceed with surgery, I explained the procedure, risks, and aftercare of cholecystectomy.  Risks include but are not limited to bleeding, infection, wound problems, anesthesia, diarrhea, bile leak, injury to common bile duct/liver/intestine, failure to relieve her symptoms.Marland Kitchen  He/she seems to understand.         Rolla Servidio J 01/03/2013, 1:03 PM

## 2013-01-03 NOTE — Patient Instructions (Signed)
Please call us if you would like to proceed with the operation.    [facility] ______[facility] LAPAROSCOPIC SURGERY: POST OP INSTRUCTIONS Always review your discharge instruction sheet given to you by the facility where your surgery was performed. IF YOU HAVE DISABILITY OR FAMILY LEAVE FORMS, YOU MUST BRING THEM TO THE OFFICE FOR PROCESSING.   DO NOT GIVE THEM TO YOUR DOCTOR.  1. A prescription for pain medication may be given to you upon discharge.  Take your pain medication as prescribed, if needed.  If narcotic pain medicine is not needed, then you may take acetaminophen (Tylenol) or ibuprofen (Advil) as needed. 2. Take your usually prescribed medications unless otherwise directed. 3. If you need a refill on your pain medication, please contact your pharmacy.  They will contact our office to request authorization. Prescriptions will not be filled after 5pm or on week-ends. 4. You should follow a light diet the first few days after arrival home, such as soup and crackers, etc.  Be sure to include lots of fluids daily. 5. Most patients will experience some swelling and bruising in the area of the incisions.  Ice packs will help.  Swelling and bruising can take several days to resolve.  6. It is common to experience some constipation if taking pain medication after surgery.  Increasing fluid intake and taking a stool softener (such as Colace) will usually help or prevent this problem from occurring.  A mild laxative (Milk of Magnesia or Miralax) should be taken according to package instructions if there are no bowel movements after 48 hours. 7. Unless discharge instructions indicate otherwise, you may remove your bandages 24-48 hours after surgery, and you may shower at that time.  You may have steri-strips (small skin tapes) in place directly over the incision.  These strips should be left on the skin for 7-10 days.  If your surgeon used skin glue on the incision, you may shower in 24  hours.  The glue will flake off over the next 2-3 weeks.  Any sutures or staples will be removed at the office during your follow-up visit. 8. ACTIVITIES:  You may resume regular (light) daily activities beginning the next day-such as daily self-care, walking, climbing stairs-gradually increasing activities as tolerated.  You may have sexual intercourse when it is comfortable.  Refrain from any heavy lifting or straining until approved by your doctor. a. You may drive when you are no longer taking prescription pain medication, you can comfortably wear a seatbelt, and you can safely maneuver your car and apply brakes. b. RETURN TO WORK:  __________________________________________________________ 9. You should see your doctor in the office for a follow-up appointment approximately 2-3 weeks after your surgery.  Make sure that you call for this appointment within a day or two after you arrive home to insure a convenient appointment time. 10. OTHER INSTRUCTIONS: __________________________________________________________________________________________________________________________ __________________________________________________________________________________________________________________________ WHEN TO CALL YOUR DOCTOR: 1. Fever over 101.0 2. Inability to urinate 3. Continued bleeding from incision. 4. Increased pain, redness, or drainage from the incision. 5. Increasing abdominal pain  The clinic staff is available to answer your questions during regular business hours.  Please don't hesitate to call and ask to speak to one of the nurses for clinical concerns.  If you have a medical emergency, go to the nearest emergency room or call 911.  A surgeon from [facility] is always on call at the hospital. [address], [place], [place]  [address] ? [id number],  [place], [place]   [address] [phone] ? [phone] ? FAX [phone] Web site:  [email or web address]

## 2013-01-10 ENCOUNTER — Ambulatory Visit
Admission: RE | Admit: 2013-01-10 | Discharge: 2013-01-10 | Disposition: A | Discharge status: Home / Self Care | Payer: 59 | Source: Ambulatory Visit | Attending: Obstetrics & Gynecology | Admitting: Obstetrics & Gynecology

## 2013-01-10 DIAGNOSIS — R928 Other abnormal and inconclusive findings on diagnostic imaging of breast: Secondary | ICD-10-CM

## 2013-01-12 ENCOUNTER — Ambulatory Visit (INDEPENDENT_AMBULATORY_CARE_PROVIDER_SITE_OTHER): Payer: 59 | Admitting: Surgery

## 2013-01-15 ENCOUNTER — Ambulatory Visit (INDEPENDENT_AMBULATORY_CARE_PROVIDER_SITE_OTHER): Payer: 59 | Admitting: Surgery

## 2013-02-20 ENCOUNTER — Encounter: Payer: Self-pay | Admitting: *Deleted

## 2013-03-28 ENCOUNTER — Encounter (INDEPENDENT_AMBULATORY_CARE_PROVIDER_SITE_OTHER): Payer: Self-pay

## 2013-03-29 ENCOUNTER — Encounter (INDEPENDENT_AMBULATORY_CARE_PROVIDER_SITE_OTHER): Payer: Self-pay

## 2013-03-30 ENCOUNTER — Ambulatory Visit: Payer: 59 | Admitting: Podiatrist

## 2013-04-05 ENCOUNTER — Other Ambulatory Visit: Payer: Self-pay

## 2013-04-05 ENCOUNTER — Ambulatory Visit: Payer: 59 | Admitting: Podiatrist

## 2013-05-17 ENCOUNTER — Inpatient Hospital Stay (HOSPITAL_COMMUNITY)
Admission: EM | Admit: 2013-05-17 | Discharge: 2013-05-19 | DRG: 101 | Disposition: A | Discharge status: Home / Self Care | Payer: 59 | Attending: Internal Medicine | Admitting: Internal Medicine

## 2013-05-17 ENCOUNTER — Encounter (HOSPITAL_COMMUNITY): Payer: Self-pay | Admitting: Emergency Medicine

## 2013-05-17 ENCOUNTER — Emergency Department (HOSPITAL_COMMUNITY): Payer: 59

## 2013-05-17 DIAGNOSIS — K5901 Slow transit constipation: Secondary | ICD-10-CM

## 2013-05-17 DIAGNOSIS — K649 Unspecified hemorrhoids: Secondary | ICD-10-CM

## 2013-05-17 DIAGNOSIS — R319 Hematuria, unspecified: Secondary | ICD-10-CM

## 2013-05-17 DIAGNOSIS — R110 Nausea: Secondary | ICD-10-CM

## 2013-05-17 DIAGNOSIS — M542 Cervicalgia: Secondary | ICD-10-CM

## 2013-05-17 DIAGNOSIS — R569 Unspecified convulsions: Principal | ICD-10-CM | POA: Insufficient documentation

## 2013-05-17 DIAGNOSIS — F17200 Nicotine dependence, unspecified, uncomplicated: Secondary | ICD-10-CM

## 2013-05-17 DIAGNOSIS — R21 Rash and other nonspecific skin eruption: Secondary | ICD-10-CM

## 2013-05-17 DIAGNOSIS — R109 Unspecified abdominal pain: Secondary | ICD-10-CM

## 2013-05-17 DIAGNOSIS — F3289 Other specified depressive episodes: Secondary | ICD-10-CM

## 2013-05-17 DIAGNOSIS — R6881 Early satiety: Secondary | ICD-10-CM

## 2013-05-17 DIAGNOSIS — J209 Acute bronchitis, unspecified: Secondary | ICD-10-CM | POA: Diagnosis present

## 2013-05-17 DIAGNOSIS — F339 Major depressive disorder, recurrent, unspecified: Secondary | ICD-10-CM | POA: Diagnosis present

## 2013-05-17 DIAGNOSIS — R141 Gas pain: Secondary | ICD-10-CM

## 2013-05-17 DIAGNOSIS — F329 Major depressive disorder, single episode, unspecified: Secondary | ICD-10-CM

## 2013-05-17 DIAGNOSIS — J302 Other seasonal allergic rhinitis: Secondary | ICD-10-CM

## 2013-05-17 DIAGNOSIS — J329 Chronic sinusitis, unspecified: Secondary | ICD-10-CM

## 2013-05-17 DIAGNOSIS — K589 Irritable bowel syndrome without diarrhea: Secondary | ICD-10-CM | POA: Diagnosis present

## 2013-05-17 DIAGNOSIS — K219 Gastro-esophageal reflux disease without esophagitis: Secondary | ICD-10-CM | POA: Diagnosis present

## 2013-05-17 DIAGNOSIS — Z87891 Personal history of nicotine dependence: Secondary | ICD-10-CM

## 2013-05-17 LAB — COMPREHENSIVE METABOLIC PANEL
ALT: 28 U/L (ref 0–35)
AST: 30 U/L (ref 0–37)
Albumin: 3.8 g/dL (ref 3.5–5.2)
BUN: 8 mg/dL (ref 6–23)
Chloride: 99 mEq/L (ref 96–112)
Creatinine, Ser: 0.75 mg/dL (ref 0.50–1.10)
GFR calc Af Amer: >90 mL/min (ref >90)
GFR calc non Af Amer: >90 mL/min (ref >90)
Glucose, Bld: 112 mg/dL — ABNORMAL HIGH (ref 70–99)
Sodium: 137 mEq/L (ref 135–145)
Total Bilirubin: 0.3 mg/dL (ref 0.3–1.2)
Total Protein: 7.4 g/dL (ref 6.0–8.3)

## 2013-05-17 LAB — TRICYCLICS SCREEN, URINE: TCA Scrn: NOT DETECTED

## 2013-05-17 LAB — GLUCOSE, CAPILLARY: Glucose-Capillary: 100 mg/dL — ABNORMAL HIGH (ref 70–99)

## 2013-05-17 LAB — URINALYSIS, ROUTINE W REFLEX MICROSCOPIC
Glucose, UA: NEGATIVE mg/dL
Ketones, ur: NEGATIVE mg/dL
Leukocytes, UA: NEGATIVE
Nitrite: NEGATIVE
Specific Gravity, Urine: 1.014 (ref 1.005–1.030)
pH: 5 (ref 5.0–8.0)

## 2013-05-17 LAB — CBC WITH DIFFERENTIAL/PLATELET
Basophils Relative: 0 % (ref 0–1)
Eosinophils Absolute: 0.1 10*3/uL (ref 0.0–0.7)
HCT: 36.9 % (ref 36.0–46.0)
Hemoglobin: 12.7 g/dL (ref 12.0–15.0)
Lymphs Abs: 1.5 10*3/uL (ref 0.7–4.0)
MCH: 32.8 pg (ref 26.0–34.0)
MCHC: 34.4 g/dL (ref 30.0–36.0)
Monocytes Absolute: 0.8 10*3/uL (ref 0.1–1.0)
Monocytes Relative: 8 % (ref 3–12)

## 2013-05-17 LAB — PREGNANCY, URINE: Preg Test, Ur: NEGATIVE

## 2013-05-17 LAB — RAPID URINE DRUG SCREEN, HOSP PERFORMED
Barbiturates: NOT DETECTED
Cocaine: NOT DETECTED
Opiates: NOT DETECTED
Tetrahydrocannabinol: NOT DETECTED

## 2013-05-17 LAB — URINE MICROSCOPIC-ADD ON

## 2013-05-17 IMAGING — CT CT HEAD W/O CM
1 series · 16 of 30 positions shown, 20 images · non-contrast
Comparison: None.

CLINICAL DATA: Seizure.

EXAM:
CT HEAD WITHOUT CONTRAST
TECHNIQUE: Contiguous axial images were obtained from the base of the skull
through the vertex without intravenous contrast.

[Series 2: head 5.0 h30s · axial · 0.44mm/px · z∈[-184,-49]mm · 16 of 30 slices shown, 20 images]
[im 2/30  brain]
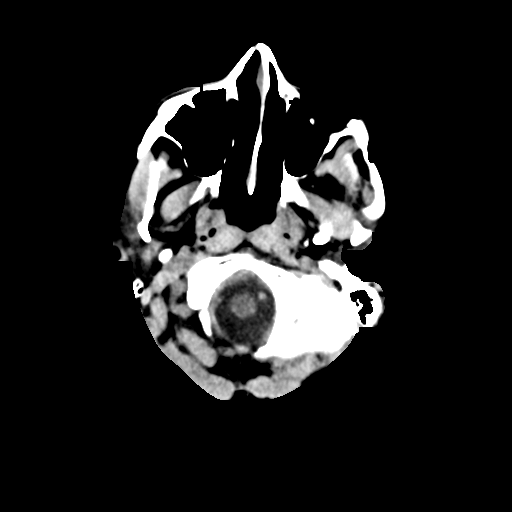
[im 2/30  bone]
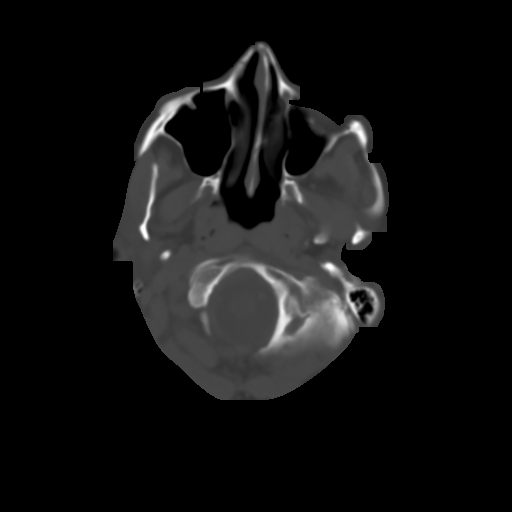
[im 4/30  brain]
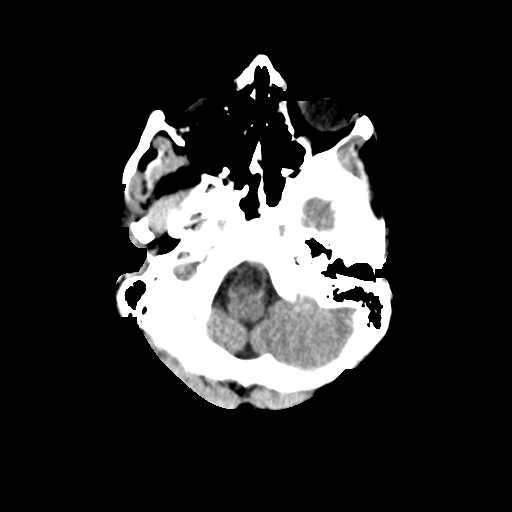
[im 6/30  brain]
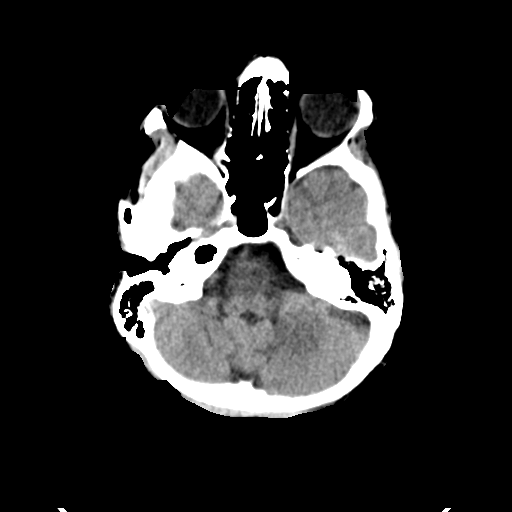
[im 8/30  brain]
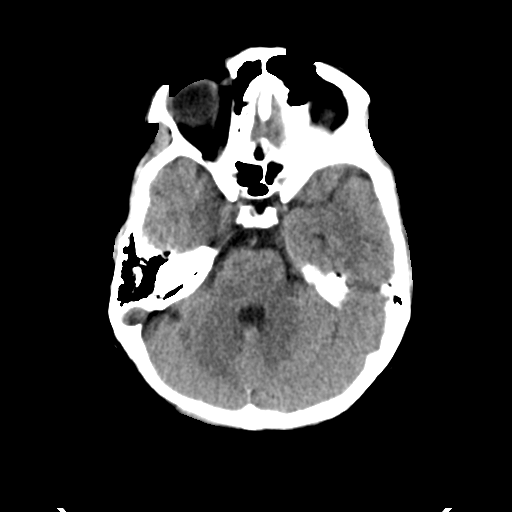
[im 9/30  brain]
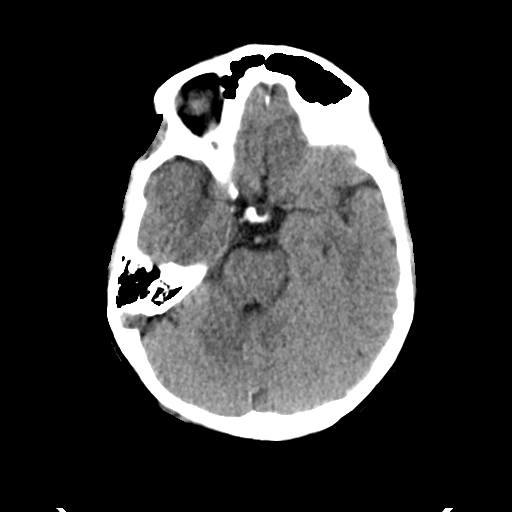
[im 9/30  bone]
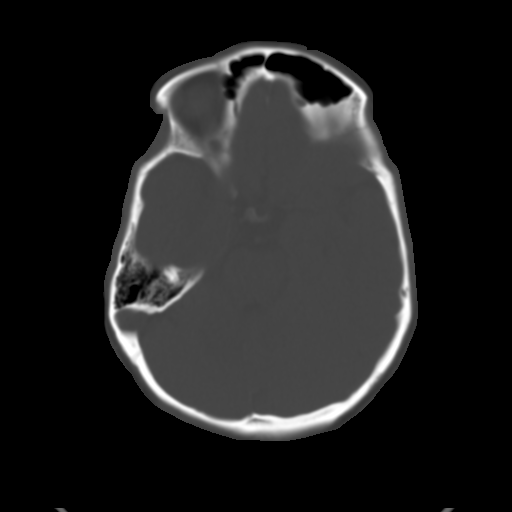
[im 11/30  brain]
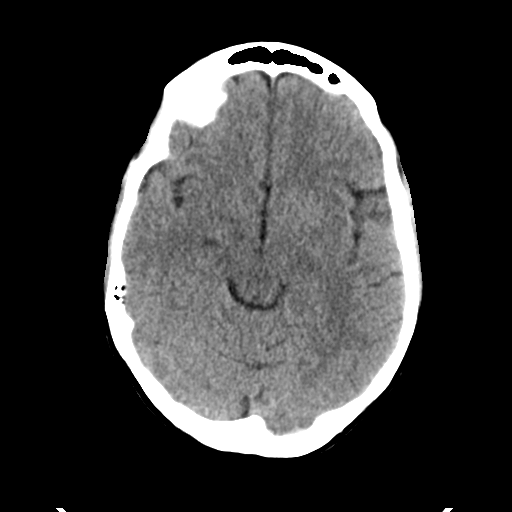
[im 13/30  brain]
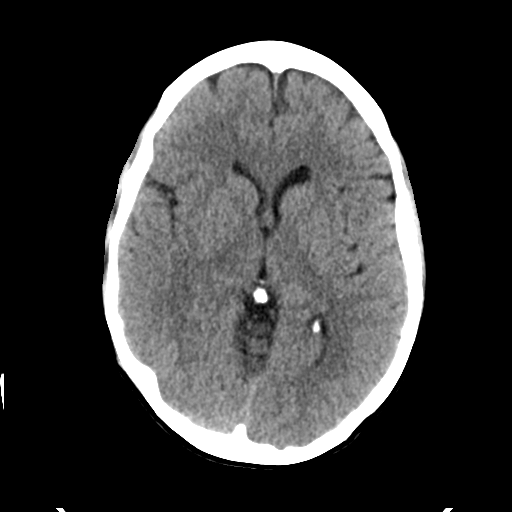
[im 15/30  brain]
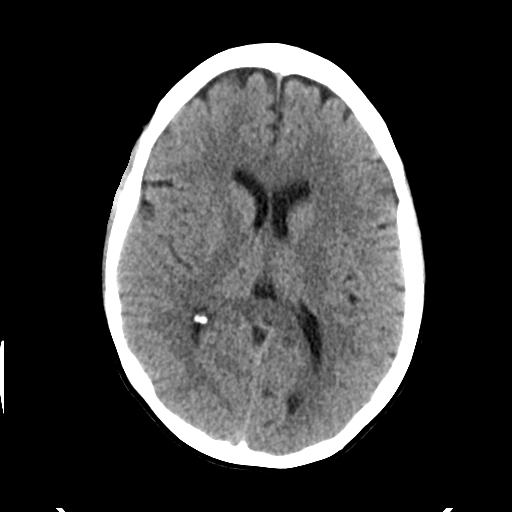
[im 16/30  brain]
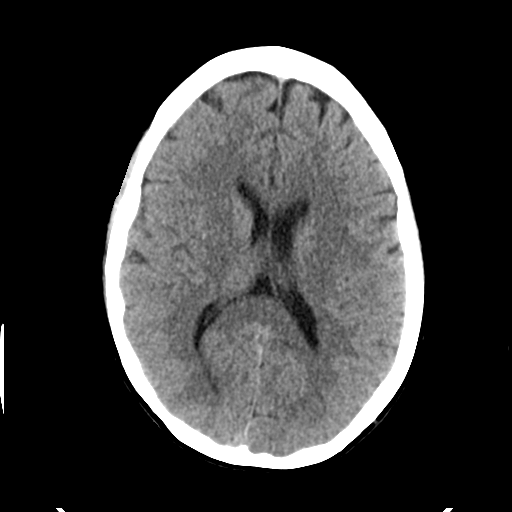
[im 16/30  bone]
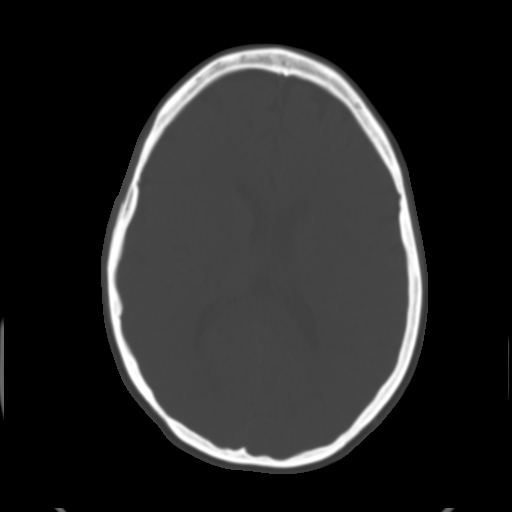
[im 18/30  brain]
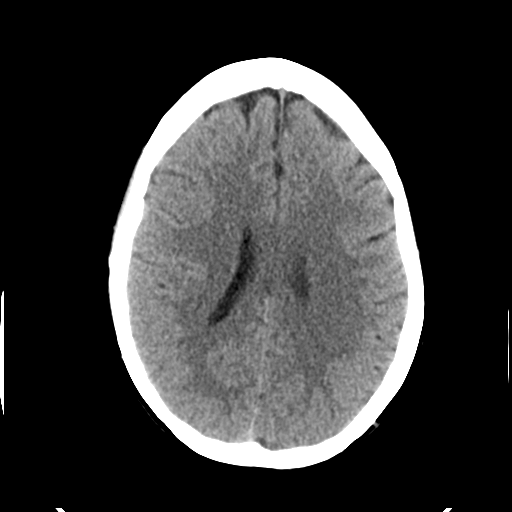
[im 20/30  brain]
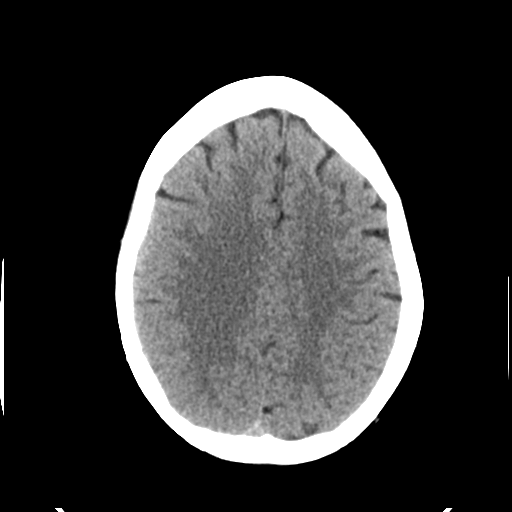
[im 22/30  brain]
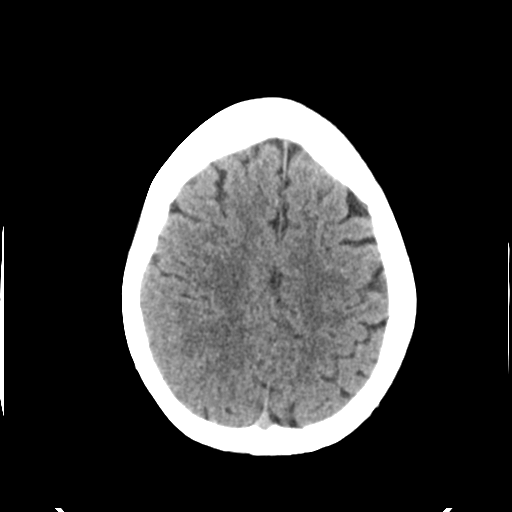
[im 23/30  brain]
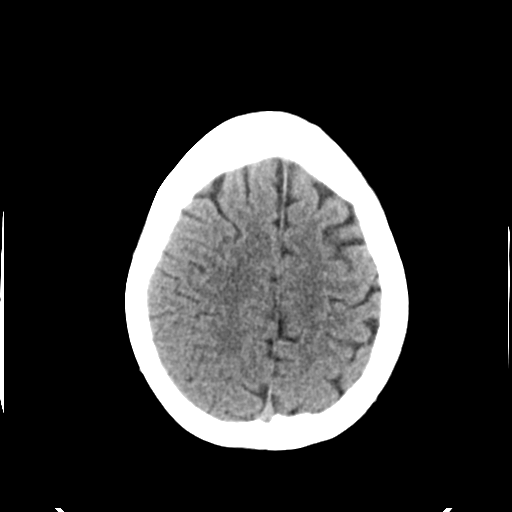
[im 23/30  bone]
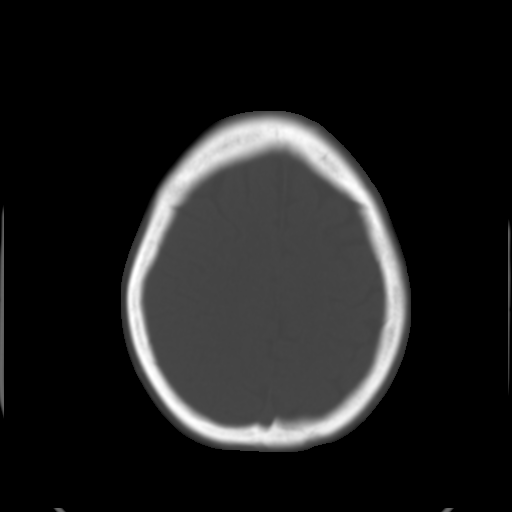
[im 25/30  brain]
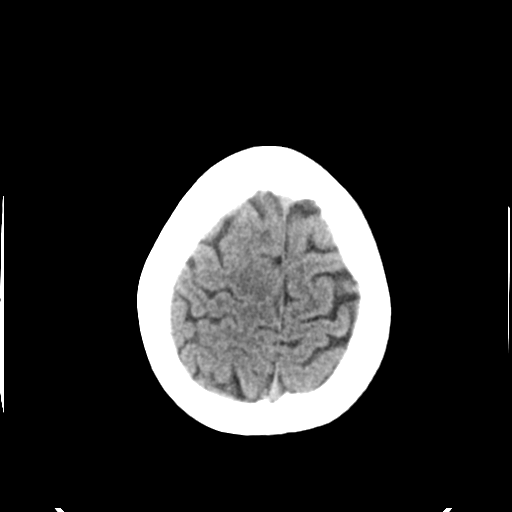
[im 27/30  brain]
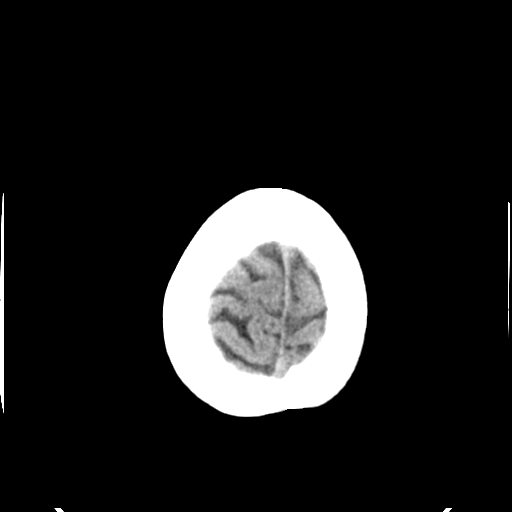
[im 29/30  brain]
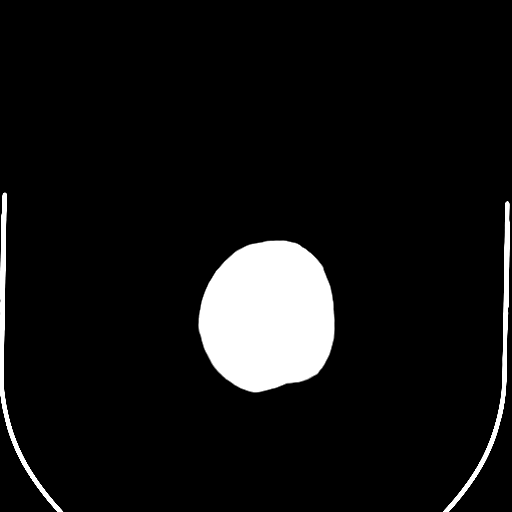

[16 of 30 positions shown; findings below may reference images not displayed]

FINDINGS: No evidence of intracranial hemorrhage, brain edema, or other signs
of acute infarction. No evidence of intracranial mass lesion or mass
effect. No abnormal extraaxial fluid collections identified.
Ventricles are normal in size. No skull abnormality identified.
IMPRESSION: Negative noncontrast head CT.

## 2013-05-17 MED ORDER — SODIUM CHLORIDE 0.9 % IV BOLUS (SEPSIS)
1000.0000 mL | Freq: Once | INTRAVENOUS | Status: AC
  Administered 2013-05-17: 1000 mL via INTRAVENOUS

## 2013-05-17 MED ORDER — ONDANSETRON HCL 4 MG/2ML IJ SOLN
4.0000 mg | Freq: Once | INTRAMUSCULAR | Status: AC
  Administered 2013-05-17: 4 mg via INTRAVENOUS
  Filled 2013-05-17: qty 2

## 2013-05-17 MED ORDER — MORPHINE SULFATE 4 MG/ML IJ SOLN
4.0000 mg | Freq: Once | INTRAMUSCULAR | Status: AC
  Administered 2013-05-17: 4 mg via INTRAVENOUS
  Filled 2013-05-17: qty 1

## 2013-05-17 MED ORDER — LORAZEPAM 2 MG/ML IJ SOLN
INTRAMUSCULAR | Status: AC
  Administered 2013-05-17: 2 mg
  Filled 2013-05-17: qty 1

## 2013-05-17 MED ORDER — ONDANSETRON HCL 4 MG/2ML IJ SOLN: 4.0000 mg | Freq: Three times a day (TID) | INTRAMUSCULAR | Status: DC | PRN

## 2013-05-17 NOTE — ED Provider Notes (Signed)
CSN: [id number]     Arrival date & time 05/17/13  1932 History   First MD Initiated Contact with Patient 05/17/13 1937     Chief Complaint  Patient presents with  . Seizures   (Consider location/radiation/quality/duration/timing/severity/associated sxs/prior Treatment) Patient is a 42 y.o. female presenting with seizures. The history is provided by medical records and the EMS personnel. No language interpreter was used.  Seizures   Lenola D Cruthis is a 42 y.o. female  with a hx of depression, anxiety, GERD presents to the Emergency Department via EMS after having a grand mal seizure at home.  Patient is postictal, slow to answer questions and remains confused.  EMS reports that she was in the home with a female and the patient appeared disheveled as if they have been having intercourse.  Patient is a level V caveat for altered mental status.  While attempting to assess, patient had a second grand mal seizure.  History was obtained by EMS in the medical record.  Past Medical History  Diagnosis Date  . Depression   . Anxiety   . IBS (irritable bowel syndrome)   . GERD (gastroesophageal reflux disease)    Past Surgical History  Procedure Laterality Date  . Cesarean section    . Dilation and curettage of uterus    . Endometrial biopsy  09/19/12    neg  . Dilitation & currettage/hystroscopy with novasure ablation N/A 10/31/2012    Procedure: DILATATION & CURETTAGE/HYSTEROSCOPY WITH NOVASURE ABLATION;  Surgeon: Annamaria Boots, MD;  Location: [facility];  Service: Gynecology;  Laterality: N/A;  Dr Gildardo Griffes Diarmid to start case with a cysto at beginning of case while patient under anesthesia.  . Laparoscopy Bilateral 10/31/2012    Procedure: LAPAROSCOPY OPERATIVE;  Surgeon: Annamaria Boots, MD;  Location: [facility];  Service: Gynecology;  Laterality: Bilateral;  . Bilateral salpingectomy Bilateral 10/31/2012    Procedure: BILATERAL SALPINGECTOMY;  Surgeon: Annamaria Boots, MD;  Location: [facility];   Service: Gynecology;  Laterality: Bilateral;  . Cysto with hydrodistension N/A 10/31/2012    Procedure: CYSTOSCOPY/HYDRODISTENSION;  Surgeon: Martina Sinner, MD;  Location: [facility];  Service: Urology;  Laterality: N/A;   Family History  Problem Relation Age of Onset  . Stroke Father   . Non-Hodgkin's lymphoma Father   . Hypertension Father   . Kidney disease Father   . Club foot Son   . Hypertension Mother    History  Substance Use Topics  . Smoking status: Former Smoker    Quit date: 01/15/2013  . Smokeless tobacco: Never Used  . Alcohol Use: Yes     Comment: occasionally   OB History   Grav Para Term Preterm Abortions TAB SAB Ect Mult Living   5 1   4 3 1   1      Review of Systems  Unable to perform ROS: Acuity of condition  Neurological: Positive for seizures.    Allergies  Ciprofloxacin  Home Medications   Current Outpatient Rx  Name  Route  Sig  Dispense  Refill  . acetaminophen (TYLENOL) 500 MG tablet   Oral   Take 1,000 mg by mouth daily as needed for mild pain.         Marland Kitchen ALPRAZolam (XANAX) 0.5 MG tablet   Oral   Take 0.5 mg by mouth daily as needed for anxiety.          Marland Kitchen dextromethorphan-guaiFENesin (MUCINEX DM) 30-600 MG per 12 hr tablet   Oral   Take  1 tablet by mouth 2 (two) times daily as needed for cough.         . fexofenadine-pseudoephedrine (ALLEGRA-D) 60-120 MG per tablet   Oral   Take 1 tablet by mouth 2 (two) times daily as needed (for allergies/congestion).          BP 113/68  Pulse 121  Temp(Src) 98.7 F (37.1 C) (Oral)  Resp 22  Ht 5\' 4"  (1.626 m)  Wt 127 lb (57.607 kg)  BMI 21.79 kg/m2  SpO2 98% Physical Exam  HENT:  Right Ear: External ear normal.  Left Ear: External ear normal.  Mouth/Throat: Oropharynx is clear and moist.  Eyes: Pupils are equal, round, and reactive to light.  Neck: Normal range of motion.  Cardiovascular: Normal heart sounds and intact distal pulses.   No murmur heard. Tachycardia   Pulmonary/Chest: Effort normal and breath sounds normal. No respiratory distress. She has no wheezes.  Clinical breath sounds  Abdominal:  Soft nondistended abdomen  Musculoskeletal:  No peripheral edema  Lymphadenopathy:    She has no cervical adenopathy.  Neurological:  Confused initially and with Grand mal seizure activity  Skin: Skin is warm and dry. No erythema.  No erythema or rash    ED Course  Procedures (including critical care time) Labs Review Labs Reviewed  CBC WITH DIFFERENTIAL - Abnormal; Notable for the following:    Neutro Abs 7.9 (*)    All other components within normal limits  COMPREHENSIVE METABOLIC PANEL - Abnormal; Notable for the following:    CO2 14 (*)    Glucose, Bld 112 (*)    All other components within normal limits  URINALYSIS, ROUTINE W REFLEX MICROSCOPIC - Abnormal; Notable for the following:    Hgb urine dipstick TRACE (*)    Protein, ur 30 (*)    All other components within normal limits  GLUCOSE, CAPILLARY - Abnormal; Notable for the following:    Glucose-Capillary 100 (*)    All other components within normal limits  URINE MICROSCOPIC-ADD ON - Abnormal; Notable for the following:    Squamous Epithelial / LPF FEW (*)    All other components within normal limits  URINE RAPID DRUG SCREEN (HOSP PERFORMED)  ETHANOL  TRICYCLICS SCREEN, URINE  PREGNANCY, URINE   Imaging Review Ct Head Wo Contrast  05/17/2013   CLINICAL DATA:  Seizure.  EXAM: CT HEAD WITHOUT CONTRAST  TECHNIQUE: Contiguous axial images were obtained from the base of the skull through the vertex without intravenous contrast.  COMPARISON:  None.  FINDINGS: No evidence of intracranial hemorrhage, brain edema, or other signs of acute infarction. No evidence of intracranial mass lesion or mass effect. No abnormal extraaxial fluid collections identified. Ventricles are normal in size. No skull abnormality identified.  IMPRESSION: Negative noncontrast head CT.   Electronically Signed    By: Myles Rosenthal M.D.   On: 05/17/2013 21:06    EKG Interpretation    Date/Time:  Thursday May 17 2013 19:40:33 EST Ventricular Rate:  90 PR Interval:  119 QRS Duration: 74 QT Interval:  364 QTC Calculation: 445 R Axis:   80 Text Interpretation:  Sinus rhythm Borderline short PR interval Minimal ST depression, diffuse leads Borderline ECG No previous tracing Confirmed by [facility]  MD, MICHEAL ([id number]) on 05/17/2013 7:52:39 PM           CRITICAL CARE Performed by: Dierdre Forth Total critical care time: Critical care time was exclusive of separately billable procedures and treating other patients. Critical care was necessary  to treat or prevent imminent or life-threatening deterioration. Critical care was time spent personally by me on the following activities: development of treatment plan with patient and/or surrogate as well as nursing, discussions with consultants, evaluation of patient's response to treatment, examination of patient, obtaining history from patient or surrogate, ordering and performing treatments and interventions, ordering and review of laboratory studies, ordering and review of radiographic studies, pulse oximetry and re-evaluation of patient's condition.   MDM   1. Seizure      Kenneshia D Cruthis was postictal on arrival via EMS, confused during evaluation with witnessed grand mal seizure activity for approximately 30 seconds here in the emergency department.  Patient given 2 of Ativan and seizure stopped.  9:30PM Mother at bedside now.  She reports the patient's friend was coming over to fix the grandfather clock.  He reported to patient's mother that when she opened the door she hand a strange stare and he told her to lay down.  He reported to the mother that she went and layed on the bed where she had her seizure.    10:11 PM Patient has returned from CT and is more alert than before. She has no neck pain or back pain. No nuchal rigidity and  reevaluation. Patient without gross deficit on neurologic exam; though I have not attempted to ambulate the patient   Neuro: Speech is clear and goal oriented, follows commands Major Cranial nerves without deficit, no facial droop Normal strength in upper and lower extremities bilaterally including dorsiflexion and plantar flexion, strong and equal grip strength Sensation normal to light and sharp touch Moves extremities without ataxia, coordination intact Normal finger to nose and rapid alternating movements Neg modified romberg, no pronator drift  Patient is afebrile. She is tachycardic; will give pain control and fluid bolus.    10:22 PM Discussed with Dr. Cyril Mourning who will evaluate.  Pt to be admited to [facility].  Dahlia Client Barrett Goldie, PA-C 05/18/13 [phone]

## 2013-05-17 NOTE — H&P (Signed)
[facility] History and Physical  Patient: [name] [name]  ZOX:[id number]  DOB: [birth year]  DOS: the patient was seen and examined on 05/17/2013 PCP: Neena Rhymes, MD  Chief Complaint: Seizure  HPI: [name] is a 42 y.o. female with Past medical history of depression and anxiety and GERD. The patient is coming from home. The patient is presenting from home. The history was obtained from patient's friend as well as patient. She mentions 2 other day she has not been feeling well and since last one week she has been having an upper respiratory infection with hoarseness of voice. She started taking Allegra and Mucinex DM today. And was unable to concentrate and feeling tired. By the family friend reach home he found the patient confused and not in her usual state. He recommended her to lie down on the bed at which time she started having generalized jerking movements which resolved and then she was not responsive therefore he called EMS. She denies any sleep depravation or use of illicit drugs. She denies any fever or chills chest pain shortness of breath nausea or vomiting abdominal pain diarrhea constipation burning urination. She had an episode of fall a few years ago when she sustained a concussion but no other trauma in her life.  Review of Systems: as mentioned in the history of present illness.  A Comprehensive review of the other systems is negative.  Past Medical History  Diagnosis Date  . Depression   . Anxiety   . IBS (irritable bowel syndrome)   . GERD (gastroesophageal reflux disease)    Past Surgical History  Procedure Laterality Date  . Cesarean section    . Dilation and curettage of uterus    . Endometrial biopsy  09/19/12    neg  . Dilitation & currettage/hystroscopy with novasure ablation N/A 10/31/2012    Procedure: DILATATION & CURETTAGE/HYSTEROSCOPY WITH NOVASURE ABLATION;  Surgeon: Annamaria Boots, MD;  Location: [facility];  Service: Gynecology;   Laterality: N/A;  Dr Gildardo Griffes Diarmid to start case with a cysto at beginning of case while patient under anesthesia.  . Laparoscopy Bilateral 10/31/2012    Procedure: LAPAROSCOPY OPERATIVE;  Surgeon: Annamaria Boots, MD;  Location: [facility];  Service: Gynecology;  Laterality: Bilateral;  . Bilateral salpingectomy Bilateral 10/31/2012    Procedure: BILATERAL SALPINGECTOMY;  Surgeon: Annamaria Boots, MD;  Location: [facility];  Service: Gynecology;  Laterality: Bilateral;  . Cysto with hydrodistension N/A 10/31/2012    Procedure: CYSTOSCOPY/HYDRODISTENSION;  Surgeon: Martina Sinner, MD;  Location: [facility];  Service: Urology;  Laterality: N/A;   Social History:  reports that she quit smoking about 4 months ago. She has never used smokeless tobacco. She reports that she drinks alcohol. She reports that she does not use illicit drugs. Independent for most of her  ADL.  Allergies  Allergen Reactions  . Ciprofloxacin Nausea Only    Family History  Problem Relation Age of Onset  . Stroke Father   . Non-Hodgkin's lymphoma Father   . Hypertension Father   . Kidney disease Father   . Club foot Son   . Hypertension Mother     Prior to Admission medications   Medication Sig Start Date End Date Taking? Authorizing Provider  acetaminophen (TYLENOL) 500 MG tablet Take 1,000 mg by mouth daily as needed for mild pain.   Yes Historical Provider, MD  ALPRAZolam Prudy Feeler) 0.5 MG tablet Take 0.5 mg by mouth daily as needed for anxiety.  08/26/11  Yes Historical  Provider, MD  dextromethorphan-guaiFENesin [facility] DM) 30-600 MG per 12 hr tablet Take 1 tablet by mouth 2 (two) times daily as needed for cough.   Yes Historical Provider, MD  fexofenadine-pseudoephedrine (ALLEGRA-D) 60-120 MG per tablet Take 1 tablet by mouth 2 (two) times daily as needed (for allergies/congestion).   Yes Historical Provider, MD    Physical Exam: Filed Vitals:   05/17/13 2230 05/17/13 2245 05/17/13 2300 05/17/13 2315  BP: 113/77  110/64 110/56 99/45  Pulse: 117 117 128 119  Temp:      TempSrc:      Resp: 19 20 18 17   Height:      Weight:      SpO2: 100% 100% 99% 100%    General: Alert, Awake and Oriented to Time, Place and Person. Appear in mild distress Eyes: PERRL ENT: Oral Mucosa clear dry. Neck: No  JVD Cardiovascular: S1 and S2 Present, no  Murmur, Peripheral Pulses Present Respiratory: Bilateral Air entry equal and Decreased, Clear to Auscultation,  No  Crackles,no  wheezes Abdomen: Bowel Sound Present, Soft and Non tender Skin: No  Rash Extremities: No  Pedal edema, no  calf tenderness Neurologic: Grossly Unremarkable.  Labs on Admission:  CBC:  Recent Labs Lab 05/17/13 2015  WBC 10.3  NEUTROABS 7.9*  HGB 12.7  HCT 36.9  MCV 95.3  PLT 240    CMP     Component Value Date/Time   NA 137 05/17/2013 2015   K 4.0 05/17/2013 2015   CL 99 05/17/2013 2015   CO2 14* 05/17/2013 2015   GLUCOSE 112* 05/17/2013 2015   BUN 8 05/17/2013 2015   CREATININE 0.75 05/17/2013 2015   CALCIUM 8.8 05/17/2013 2015   PROT 7.4 05/17/2013 2015   ALBUMIN 3.8 05/17/2013 2015   AST 30 05/17/2013 2015   ALT 28 05/17/2013 2015   ALKPHOS 103 05/17/2013 2015   BILITOT 0.3 05/17/2013 2015   GFRNONAA >90 05/17/2013 2015   GFRAA >90 05/17/2013 2015    No results found for this basename: LIPASE, AMYLASE,  in the last 168 hours No results found for this basename: AMMONIA,  in the last 168 hours  No results found for this basename: CKTOTAL, CKMB, CKMBINDEX, TROPONINI,  in the last 168 hours BNP (last 3 results) No results found for this basename: PROBNP,  in the last 8760 hours  Radiological Exams on Admission: Ct Head Wo Contrast  05/17/2013   CLINICAL DATA:  Seizure.  EXAM: CT HEAD WITHOUT CONTRAST  TECHNIQUE: Contiguous axial images were obtained from the base of the skull through the vertex without intravenous contrast.  COMPARISON:  None.  FINDINGS: No evidence of intracranial hemorrhage, brain edema, or  other signs of acute infarction. No evidence of intracranial mass lesion or mass effect. No abnormal extraaxial fluid collections identified. Ventricles are normal in size. No skull abnormality identified.  IMPRESSION: Negative noncontrast head CT.   Electronically Signed   By: Myles Rosenthal M.D.   On: 05/17/2013 21:06   Assessment/Plan Principal Problem:   Seizures Active Problems:   DEPRESSION   1. Seizures The patient is presenting with generalized tonic-clonic seizures. She had one episode with EMS into episode in the hospital. Her initial lab work and CT scan as well as neuro examination appears unremarkable. At present she will be admitted to step down unit. I will put her on Ativan when necessary for seizures. No AED per neurology until workup is complete EEG in the morning MRI with and without contrast Neuro checks  Seizure precautions aspiration precautions  2. Possible bronchitis I would cover her with Augmentin  Consults: Neurology appreciate input  DVT Prophylaxis: subcutaneous Heparin Nutrition: Regular diet advance as tolerated  Code Status: Full  Family Communication: Mother was present at bedside, opportunity was given to ask question and all questions were answered satisfactorily at the time of interview. Disposition: Admitted to inpatient in step-down unit.  Author: Lynden Oxford, MD [facility] Pager: [phone] 05/17/2013, 11:59 PM    If 7PM-7AM, please contact night-coverage [email or web address] Password [email or web address]

## 2013-05-17 NOTE — ED Notes (Signed)
Pt friend who found her at home today reported when he arrived at pt home to fix her clock, pt was staring and not speaking to him.  After attempting to get her to speak to him, he realized something was wrong, attempted to assist her to lay down when she began to jerk.  He then laid her on the floor and called 911.

## 2013-05-17 NOTE — ED Notes (Signed)
EMS reported a man was at the home with patient, pt clothing partially on, pants lowered, bra askew and unclasped upon their arrival.

## 2013-05-17 NOTE — ED Provider Notes (Signed)
Medical screening examination/treatment/procedure(s) were conducted as a shared visit with non-physician practitioner(s) and myself.  I personally evaluated the patient during the encounter.  EKG Interpretation    Date/Time:  Thursday May 17 2013 19:40:33 EST Ventricular Rate:  90 PR Interval:  119 QRS Duration: 74 QT Interval:  364 QTC Calculation: 445 R Axis:   80 Text Interpretation:  Sinus rhythm Borderline short PR interval Minimal ST depression, diffuse leads Borderline ECG No previous tracing Confirmed by [facility]  MD, MICHEAL ([id number]) on 05/17/2013 7:52:39 PM            No prior h/o seizures.  One by history, per EMS, pt was only partially clothed.  Pt had another grand mal seizure shortly after arrival in the ED, remains post ictal, confused, unable to give additional history.  After seizure, pt was moving 4 extremities purposefully, no asymetry, no facial droop. BZN's given here.  Will get head CT, drug screen.  Pt has xanax on her med list, possibly withdrawal or ETOH use is contributing.  Will continue to monitor carefully here for improvement.  May need neuro consultation due to repeated seizures with no prior history.  Currently airway protected, hemodynamically stable after 2nd seizure.    Gavin Pound. Oletta Lamas, MD 05/17/13 2107

## 2013-05-17 NOTE — ED Notes (Signed)
Pt remains alert.  Mother and son at bedside.  Pt continues to answer questions without delay.  C/O headache.

## 2013-05-17 NOTE — ED Notes (Signed)
hospitalist in room  

## 2013-05-17 NOTE — ED Notes (Signed)
NRB placed during seizure activity; now changed to [place] @ 2L.  O2 sat remains >98.  Pt skin pink, wm, dry.  Seizure pads were placed upon patient arrival.

## 2013-05-17 NOTE — ED Notes (Signed)
Pt no responding well to all questions, began to stare, then has seizure with myself and PA at bedside.

## 2013-05-17 NOTE — ED Notes (Signed)
Returned from CT. Pt lucidity much improved, quick to answer questions.  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  Denies hx of seizures.  Stated she tool allegra D and mucinex today.  Is aware she is in the ED and has had some tests done.

## 2013-05-17 NOTE — ED Notes (Signed)
Pt remains alert, talking appropriately.  HA remains.  Mother and family members at bedside.  Awaiting neuro consult.

## 2013-05-17 NOTE — ED Notes (Signed)
Removed pt clothing to mini cath UA.  Pt underpants on inside out with the crotch on the right hip.

## 2013-05-17 NOTE — ED Notes (Signed)
Pt was home with friend, friend saw her "stare" and lowered her to the floor.  Pt then had seizure; EMS reports post ictal upon arrival to scene. Now has HA rated 7/10

## 2013-05-17 NOTE — ED Notes (Signed)
Report to [place] RN on 3S.

## 2013-05-17 NOTE — Consult Note (Addendum)
NEURO HOSPITALIST CONSULT NOTE    Reason for Consult: new onset seizures.  HPI:                                                                                                                                          [name] [name] is an 42 y.o. female, right handed, with a past medical history significant for depression, anxiety, IBS, GERD, brought to [facility] ED by EMS after sustained a witnessed seizure at home this afternoon. She was reportedly back to baseline upon arrival to ED but then had another GTC seizure after arrival here. Patient has no recollection of what happened at home and while in the ED, but said that she wasn't feeling feel, kind of " weird" the whole day.  Her mother is at bedside and stated that a close friend of her daughter went to visit her at home and when she opened the door he noticed that Mrs. [name] had a " blank stare and was not in her usual mental state, kind of less responsive". Then she went to lie down in her bed and started having generalized jerking movements for a couple of minutes. Denies bladder or bowel incontinence but afterwards she was confused and amnestic for the event. Denies excessive use of alcohol but tells me thad she had " something little to drink today".  Furthermore, she said that she takes xanax only sporadically. Has a very stressful job but her stress level has been the same lately. Denies sleep deprivation, use of narcotics, or illicit drugs. No recent fever, chills, vaccinations, foreign travel, or head injury. No history of febrile seizures, CNS infection, severe head trauma, or stroke. No family history of epilepsy. Born full term, product of a normal pregnancy and delivery, no neonatal complications. Met all her milestones at the appropriate ages. Denies HA, vertigo, double vision, focal weakness or numbness, slurred speech, language or visual disturbances. CT brain unremarkable.  Serologies, ETOH level, urine drug  screen unremarkable.    Past Medical History  Diagnosis Date  . Depression   . Anxiety   . IBS (irritable bowel syndrome)   . GERD (gastroesophageal reflux disease)     Past Surgical History  Procedure Laterality Date  . Cesarean section    . Dilation and curettage of uterus    . Endometrial biopsy  09/19/12    neg  . Dilitation & currettage/hystroscopy with novasure ablation N/A 10/31/2012    Procedure: DILATATION & CURETTAGE/HYSTEROSCOPY WITH NOVASURE ABLATION;  Surgeon: Annamaria Boots, MD;  Location: [facility];  Service: Gynecology;  Laterality: N/A;  Dr Gildardo Griffes Diarmid to start case with a cysto at beginning of case while patient under anesthesia.  . Laparoscopy Bilateral 10/31/2012    Procedure: LAPAROSCOPY OPERATIVE;  Surgeon: Annamaria Boots,  MD;  Location: [facility];  Service: Gynecology;  Laterality: Bilateral;  . Bilateral salpingectomy Bilateral 10/31/2012    Procedure: BILATERAL SALPINGECTOMY;  Surgeon: Annamaria Boots, MD;  Location: [facility];  Service: Gynecology;  Laterality: Bilateral;  . Cysto with hydrodistension N/A 10/31/2012    Procedure: CYSTOSCOPY/HYDRODISTENSION;  Surgeon: Martina Sinner, MD;  Location: [facility];  Service: Urology;  Laterality: N/A;    Family History  Problem Relation Age of Onset  . Stroke Father   . Non-Hodgkin's lymphoma Father   . Hypertension Father   . Kidney disease Father   . Club foot Son   . Hypertension Mother     Family History:   Social History:  reports that she quit smoking about 4 months ago. She has never used smokeless tobacco. She reports that she drinks alcohol. She reports that she does not use illicit drugs.  Allergies  Allergen Reactions  . Ciprofloxacin Nausea Only    MEDICATIONS:                                                                                                                     I have reviewed the patient's current medications.   ROS:                                                                                                                                        History obtained from patient, chart review and family.  General ROS: negative for - chills, fatigue, fever, night sweats, weight gain or weight loss Psychological ROS: negative for - behavioral disorder, hallucinations, memory difficulties,  or suicidal ideation Ophthalmic ROS: negative for - blurry vision, double vision, eye pain or loss of vision ENT ROS: negative for - epistaxis, nasal discharge, oral lesions, sore throat, tinnitus or vertigo Allergy and Immunology ROS: negative for - hives or itchy/watery eyes Hematological and Lymphatic ROS: negative for - bleeding problems, bruising or swollen lymph nodes Endocrine ROS: negative for - galactorrhea, hair pattern changes, polydipsia/polyuria or temperature intolerance Respiratory ROS: negative for - cough, hemoptysis, shortness of breath or wheezing Cardiovascular ROS: negative for - chest pain, dyspnea on exertion, edema or irregular heartbeat Gastrointestinal ROS: negative for - abdominal pain, diarrhea, hematemesis, nausea/vomiting or stool incontinence Genito-Urinary ROS: negative for - dysuria, hematuria, incontinence or urinary frequency/urgency Musculoskeletal ROS: negative for - joint swelling or muscular weakness Neurological ROS: as noted in HPI Dermatological ROS:  negative for rash and skin lesion changes   Physical exam: pleasant female in no apparent distress. Blood pressure 113/68, pulse 121, temperature 98.7 F (37.1 C), temperature source Oral, resp. rate 22, height 5\' 4"  (1.626 m), weight 57.607 kg (127 lb), SpO2 98.00%. Head: normocephalic. Neck: supple, no bruits, no JVD. Cardiac: no murmurs. Lungs: clear. Abdomen: soft, no tender, no mass. Extremities: no edema.  Neurologic Examination:                                                                                                      Mental Status: Alert, oriented, thought content  appropriate.  Speech fluent without evidence of aphasia.  Able to follow 3 step commands without difficulty. Cranial Nerves: II: Discs flat bilaterally; Visual fields grossly normal, pupils equal, round, reactive to light and accommodation III,IV, VI: ptosis not present, extra-ocular motions intact bilaterally V,VII: smile symmetric, facial light touch sensation normal bilaterally VIII: hearing normal bilaterally IX,X: gag reflex present XI: bilateral shoulder shrug XII: midline tongue extension without atrophy or fasciculations  Motor: Right : Upper extremity   5/5    Left:     Upper extremity   5/5  Lower extremity   5/5     Lower extremity   5/5 Tone and bulk:normal tone throughout; no atrophy noted Sensory: Pinprick and light touch intact throughout, bilaterally Deep Tendon Reflexes:  Right: Upper Extremity   Left: Upper extremity   biceps (C-5 to C-6) 2/4   biceps (C-5 to C-6) 2/4 tricep (C7) 2/4    triceps (C7) 2/4 Brachioradialis (C6) 2/4  Brachioradialis (C6) 2/4  Lower Extremity Lower Extremity  quadriceps (L-2 to L-4) 2/4   quadriceps (L-2 to L-4) 2/4 Achilles (S1) 2/4   Achilles (S1) 2/4  Plantars: Right: downgoing   Left: downgoing Cerebellar: normal finger-to-nose,  normal heel-to-shin test Gait:  No tested. CV: pulses palpable throughout    Lab Results  Component Value Date/Time   CHOL 182 02/19/2011  9:19 AM    Results for orders placed during the hospital encounter of 05/17/13 (from the past 48 hour(s))  URINE RAPID DRUG SCREEN (HOSP PERFORMED)     Status: None   Collection Time    05/17/13  8:04 PM      Result Value Range   Opiates NONE DETECTED  NONE DETECTED   Cocaine NONE DETECTED  NONE DETECTED   Benzodiazepines NONE DETECTED  NONE DETECTED   Amphetamines NONE DETECTED  NONE DETECTED   Tetrahydrocannabinol NONE DETECTED  NONE DETECTED   Barbiturates NONE DETECTED  NONE DETECTED   Comment:            DRUG SCREEN FOR MEDICAL PURPOSES     ONLY.   IF CONFIRMATION IS NEEDED     FOR ANY PURPOSE, NOTIFY LAB     WITHIN 5 DAYS.                LOWEST DETECTABLE LIMITS     FOR URINE DRUG SCREEN     Drug Class       Cutoff (ng/mL)  Amphetamine      1000     Barbiturate      200     Benzodiazepine   200     Tricyclics       300     Opiates          300     Cocaine          300     THC              50  TRICYCLICS SCREEN, URINE     Status: None   Collection Time    05/17/13  8:04 PM      Result Value Range   TCA Scrn NONE DETECTED  NONE DETECTED   Comment:            LOWEST DETECTABLE LIMITS     FOR URINE DRUG SCREEN     Drug Class       Cutoff (ng/mL)     Tricyclics       300  URINALYSIS, ROUTINE W REFLEX MICROSCOPIC     Status: Abnormal   Collection Time    05/17/13  8:04 PM      Result Value Range   Color, Urine YELLOW  YELLOW   APPearance CLEAR  CLEAR   Specific Gravity, Urine 1.014  1.005 - 1.030   pH 5.0  5.0 - 8.0   Glucose, UA NEGATIVE  NEGATIVE mg/dL   Hgb urine dipstick TRACE (*) NEGATIVE   Bilirubin Urine NEGATIVE  NEGATIVE   Ketones, ur NEGATIVE  NEGATIVE mg/dL   Protein, ur 30 (*) NEGATIVE mg/dL   Urobilinogen, UA 0.2  0.0 - 1.0 mg/dL   Nitrite NEGATIVE  NEGATIVE   Leukocytes, UA NEGATIVE  NEGATIVE  PREGNANCY, URINE     Status: None   Collection Time    05/17/13  8:04 PM      Result Value Range   Preg Test, Ur NEGATIVE  NEGATIVE   Comment:            THE SENSITIVITY OF THIS     METHODOLOGY IS >20 mIU/mL.  URINE MICROSCOPIC-ADD ON     Status: Abnormal   Collection Time    05/17/13  8:04 PM      Result Value Range   Squamous Epithelial / LPF FEW (*) RARE   WBC, UA 0-2  <3 WBC/hpf   RBC / HPF 0-2  <3 RBC/hpf   Bacteria, UA RARE  RARE  CBC WITH DIFFERENTIAL     Status: Abnormal   Collection Time    05/17/13  8:15 PM      Result Value Range   WBC 10.3  4.0 - 10.5 K/uL   RBC 3.87  3.87 - 5.11 MIL/uL   Hemoglobin 12.7  12.0 - 15.0 g/dL   HCT 16.1  09.6 - 04.5 %   MCV 95.3  78.0 - 100.0 fL   MCH  32.8  26.0 - 34.0 pg   MCHC 34.4  30.0 - 36.0 g/dL   RDW 40.9  81.1 - 91.4 %   Platelets 240  150 - 400 K/uL   Neutrophils Relative % 76  43 - 77 %   Neutro Abs 7.9 (*) 1.7 - 7.7 K/uL   Lymphocytes Relative 15  12 - 46 %   Lymphs Abs 1.5  0.7 - 4.0 K/uL   Monocytes Relative 8  3 - 12 %   Monocytes Absolute 0.8  0.1 - 1.0 K/uL   Eosinophils Relative  1  0 - 5 %   Eosinophils Absolute 0.1  0.0 - 0.7 K/uL   Basophils Relative 0  0 - 1 %   Basophils Absolute 0.0  0.0 - 0.1 K/uL  COMPREHENSIVE METABOLIC PANEL     Status: Abnormal   Collection Time    05/17/13  8:15 PM      Result Value Range   Sodium 137  135 - 145 mEq/L   Potassium 4.0  3.5 - 5.1 mEq/L   Chloride 99  96 - 112 mEq/L   CO2 14 (*) 19 - 32 mEq/L   Glucose, Bld 112 (*) 70 - 99 mg/dL   BUN 8  6 - 23 mg/dL   Creatinine, Ser 4.09  0.50 - 1.10 mg/dL   Calcium 8.8  8.4 - 81.1 mg/dL   Total Protein 7.4  6.0 - 8.3 g/dL   Albumin 3.8  3.5 - 5.2 g/dL   AST 30  0 - 37 U/L   ALT 28  0 - 35 U/L   Alkaline Phosphatase 103  39 - 117 U/L   Total Bilirubin 0.3  0.3 - 1.2 mg/dL   GFR calc non Af Amer >90  >90 mL/min   GFR calc Af Amer >90  >90 mL/min   Comment: (NOTE)     The eGFR has been calculated using the CKD EPI equation.     This calculation has not been validated in all clinical situations.     eGFR's persistently <90 mL/min signify possible Chronic Kidney     Disease.  ETHANOL     Status: None   Collection Time    05/17/13  8:15 PM      Result Value Range   Alcohol, Ethyl (B) <11  0 - 11 mg/dL   Comment:            LOWEST DETECTABLE LIMIT FOR     SERUM ALCOHOL IS 11 mg/dL     FOR MEDICAL PURPOSES ONLY  GLUCOSE, CAPILLARY     Status: Abnormal   Collection Time    05/17/13  8:28 PM      Result Value Range   Glucose-Capillary 100 (*) 70 - 99 mg/dL    Ct Head Wo Contrast  05/17/2013   CLINICAL DATA:  Seizure.  EXAM: CT HEAD WITHOUT CONTRAST  TECHNIQUE: Contiguous axial images were obtained from the base of the  skull through the vertex without intravenous contrast.  COMPARISON:  None.  FINDINGS: No evidence of intracranial hemorrhage, brain edema, or other signs of acute infarction. No evidence of intracranial mass lesion or mass effect. No abnormal extraaxial fluid collections identified. Ventricles are normal in size. No skull abnormality identified.  IMPRESSION: Negative noncontrast head CT.   Electronically Signed   By: Myles Rosenthal M.D.   On: 05/17/2013 21:06   Assessment/Plan: 42 y/o without known risk factors for epilepsy, brought in with new onset seizures, probable unprovoked GTC seizures.. Non focal neuro-exam. Unremarkable unenhanced CT brain. Serologies, ETOH level, and drug screen panel unremarkable. Recommend: 1) MRI brain with and without contrast. 2) EEG. 3) No AEDs for now, pending results seizure work up. 4) Seizure precautions.   Wyatt Portela, MD 05/17/2013, 10:35 PM

## 2013-05-17 NOTE — ED Notes (Signed)
MRI on way to do scan.  Will hold pt until she arrives.

## 2013-05-18 ENCOUNTER — Inpatient Hospital Stay (HOSPITAL_COMMUNITY): Payer: 59

## 2013-05-18 DIAGNOSIS — F3289 Other specified depressive episodes: Secondary | ICD-10-CM

## 2013-05-18 DIAGNOSIS — F329 Major depressive disorder, single episode, unspecified: Secondary | ICD-10-CM

## 2013-05-18 DIAGNOSIS — R569 Unspecified convulsions: Principal | ICD-10-CM

## 2013-05-18 LAB — COMPREHENSIVE METABOLIC PANEL
ALT: 25 U/L (ref 0–35)
AST: 29 U/L (ref 0–37)
Albumin: 3 g/dL — ABNORMAL LOW (ref 3.5–5.2)
Alkaline Phosphatase: 80 U/L (ref 39–117)
Potassium: 3.5 mEq/L (ref 3.5–5.1)
Sodium: 139 mEq/L (ref 135–145)
Total Protein: 6 g/dL (ref 6.0–8.3)

## 2013-05-18 LAB — CBC
HCT: 31.5 % — ABNORMAL LOW (ref 36.0–46.0)
MCH: 32.3 pg (ref 26.0–34.0)
MCHC: 34 g/dL (ref 30.0–36.0)
MCV: 95.2 fL (ref 78.0–100.0)
RDW: 12.3 % (ref 11.5–15.5)

## 2013-05-18 LAB — MRSA PCR SCREENING: MRSA by PCR: NEGATIVE

## 2013-05-18 LAB — INFLUENZA PANEL BY PCR (TYPE A & B): Influenza A By PCR: NEGATIVE

## 2013-05-18 MED ORDER — ENOXAPARIN SODIUM 40 MG/0.4ML ~~LOC~~ SOLN
40.0000 mg | SUBCUTANEOUS | Status: DC
  Administered 2013-05-18: 40 mg via SUBCUTANEOUS
  Filled 2013-05-18 (×2): qty 0.4

## 2013-05-18 MED ORDER — ONDANSETRON HCL 4 MG PO TABS: 4.0000 mg | ORAL_TABLET | Freq: Four times a day (QID) | ORAL | Status: DC | PRN

## 2013-05-18 MED ORDER — ACETAMINOPHEN 325 MG PO TABS
650.0000 mg | ORAL_TABLET | Freq: Four times a day (QID) | ORAL | Status: DC | PRN
  Administered 2013-05-18 (×3): 650 mg via ORAL
  Filled 2013-05-18 (×3): qty 2

## 2013-05-18 MED ORDER — LORAZEPAM 2 MG/ML IJ SOLN: 1.0000 mg | INTRAMUSCULAR | Status: DC | PRN

## 2013-05-18 MED ORDER — ACETAMINOPHEN 650 MG RE SUPP: 650.0000 mg | Freq: Four times a day (QID) | RECTAL | Status: DC | PRN

## 2013-05-18 MED ORDER — AMOXICILLIN-POT CLAVULANATE 500-125 MG PO TABS: 1.0000 | ORAL_TABLET | Freq: Three times a day (TID) | ORAL | Status: DC

## 2013-05-18 MED ORDER — AMOXICILLIN-POT CLAVULANATE 500-125 MG PO TABS
1.0000 | ORAL_TABLET | Freq: Three times a day (TID) | ORAL | Status: DC
  Administered 2013-05-18 – 2013-05-19 (×5): 500 mg via ORAL
  Filled 2013-05-18 (×8): qty 1

## 2013-05-18 MED ORDER — ONDANSETRON HCL 4 MG/2ML IJ SOLN: 4.0000 mg | Freq: Four times a day (QID) | INTRAMUSCULAR | Status: DC | PRN

## 2013-05-18 MED ORDER — PHENOL 1.4 % MT LIQD
1.0000 | OROMUCOSAL | Status: DC | PRN
Start: 2013-05-18 — End: 2013-05-19
  Administered 2013-05-18: 1 via OROMUCOSAL
  Filled 2013-05-18: qty 177

## 2013-05-18 MED ORDER — GADOBENATE DIMEGLUMINE 529 MG/ML IV SOLN
15.0000 mL | Freq: Once | INTRAVENOUS | Status: AC
  Administered 2013-05-18: 12 mL via INTRAVENOUS

## 2013-05-18 MED ORDER — SODIUM CHLORIDE 0.9 % IJ SOLN
3.0000 mL | Freq: Two times a day (BID) | INTRAMUSCULAR | Status: DC
  Administered 2013-05-18 (×2): 3 mL via INTRAVENOUS

## 2013-05-18 MED ORDER — SODIUM CHLORIDE 0.9 % IV SOLN
INTRAVENOUS | Status: DC
  Administered 2013-05-18: 01:00:00 via INTRAVENOUS

## 2013-05-18 NOTE — Progress Notes (Signed)
[facility] Progress Note  TEAM 1 - Stepdown/ICU TEAM   [name] [name] ZOX:[id number] DOB: 03-17-1971 DOA: 05/17/2013 PCP: Neena Rhymes, MD  Brief narrative: [name] [name] is a 42 y.o. female with Past medical history of depression and anxiety and GERD.  The patient is coming from home.  The patient is presenting from home. The history was obtained from patient's friend as well as patient. She mentions 2 other day she has not been feeling well and since last one week she has been having an upper respiratory infection with hoarseness of voice. She started taking Allegra and Mucinex DM today. And was unable to concentrate and feeling tired.  By the family friend reach home he found the patient confused and not in her usual state. He recommended her to lie down on the bed at which time she started having generalized jerking movements which resolved and then she was not responsive therefore he called EMS. She denies any sleep depravation or use of illicit drugs.   Assessment/Plan: Principal Problem:   Seizures - suspected to to respiratory illness, lack of sleep and stress - EEG negative but pt feels that the headache that she had prior to the seizure is recurring- will watch in hospital overnight for recurrence  Active Problems: Sinusitis/ Acute bronchitis - cont Augmentin - Influenza negative    DEPRESSION - stable    Code Status: full code Family Communication: none Disposition Plan: d/c home in AM if stable  Consultants: Neuro  Procedures: none  Antibiotics: Augmentin 12/19  DVT prophylaxis: Lovenox  HPI/Subjective: Pt alert and overall was feeling better until headache started to recur - no other symptoms other than green nasal discharge and sputum   Objective: Blood pressure 100/71, pulse 79, temperature 97.8 F (36.6 C), temperature source Oral, resp. rate 17, height 5\' 4"  (1.626 m), weight 59.9 kg (132 lb 0.9 oz), SpO2  98.00%.  Intake/Output Summary (Last 24 hours) at 05/18/13 1758 Last data filed at 05/18/13 1200  Gross per 24 hour  Intake    980 ml  Output    350 ml  Net    630 ml     Exam: General: No acute respiratory distress Lungs: Clear to auscultation bilaterally without wheezes or crackles Cardiovascular: Regular rate and rhythm without murmur gallop or rub normal S1 and S2 Abdomen: Nontender, nondistended, soft, bowel sounds positive, no rebound, no ascites, no appreciable mass Extremities: No significant cyanosis, clubbing, or edema bilateral lower extremities  Data Reviewed: Basic Metabolic Panel:  Recent Labs Lab 05/17/13 2015 05/18/13 0545  NA 137 139  K 4.0 3.5  CL 99 107  CO2 14* 21  GLUCOSE 112* 93  BUN 8 6  CREATININE 0.75 0.61  CALCIUM 8.8 8.1*   Liver Function Tests:  Recent Labs Lab 05/17/13 2015 05/18/13 0545  AST 30 29  ALT 28 25  ALKPHOS 103 80  BILITOT 0.3 0.4  PROT 7.4 6.0  ALBUMIN 3.8 3.0*   No results found for this basename: LIPASE, AMYLASE,  in the last 168 hours No results found for this basename: AMMONIA,  in the last 168 hours CBC:  Recent Labs Lab 05/17/13 2015 05/18/13 0545  WBC 10.3 9.2  NEUTROABS 7.9*  --   HGB 12.7 10.7*  HCT 36.9 31.5*  MCV 95.3 95.2  PLT 240 222   Cardiac Enzymes: No results found for this basename: CKTOTAL, CKMB, CKMBINDEX, TROPONINI,  in the last 168 hours BNP (last 3 results) No results found for this  basename: PROBNP,  in the last 8760 hours CBG:  Recent Labs Lab 05/17/13 2028  GLUCAP 100*    Recent Results (from the past 240 hour(s))  MRSA PCR SCREENING     Status: None   Collection Time    05/18/13  1:36 AM      Result Value Range Status   MRSA by PCR NEGATIVE  NEGATIVE Final   Comment:            The GeneXpert MRSA Assay (FDA     approved for NASAL specimens     only), is one component of a     comprehensive MRSA colonization     surveillance program. It is not     intended to  diagnose MRSA     infection nor to guide or     monitor treatment for     MRSA infections.     Studies:  Recent x-ray studies have been reviewed in detail by the Attending Physician  Scheduled Meds:  Scheduled Meds: . amoxicillin-clavulanate  1 tablet Oral TID  . enoxaparin (LOVENOX) injection  40 mg Subcutaneous Q24H  . sodium chloride  3 mL Intravenous Q12H   Continuous Infusions:   Time spent on care of this patient: 35 min   Cordelle Dahmen, MD  [facility] Office  [phone] Pager - Text Page per Loretha Stapler as per below:  On-Call/Text Page:      [email or web address]      password [email or web address]  If 7PM-7AM, please contact night-coverage [email or web address] Password [email or web address] 05/18/2013, 5:58 PM   LOS: 1 day

## 2013-05-18 NOTE — Procedures (Signed)
ELECTROENCEPHALOGRAM REPORT   Patient: [name] [name]       Room #: 4U98  Age: 42 y.o.        Sex: female Referring Physician: Rizwan Report Date:  05/18/2013        Interpreting Physician: Thana Farr D  History: [name] is an 42 y.o. female with new onset seizure  Medications:  Scheduled: . amoxicillin-clavulanate  1 tablet Oral TID  . enoxaparin (LOVENOX) injection  40 mg Subcutaneous Q24H  . sodium chloride  3 mL Intravenous Q12H    Conditions of Recording:  This is a 16 channel EEG carried out with the patient in the awake, drowsy and asleep states.  Description:  The waking background activity consists of a low voltage, symmetrical, fairly well organized, 9 Hz alpha activity, seen from the parieto-occipital and posterior temporal regions.  Low voltage fast activity, poorly organized, is seen anteriorly and is at times superimposed on more posterior regions.  A mixture of theta and alpha rhythms are seen from the central and temporal regions. The patient drowses with slowing to irregular, low voltage theta and beta activity.   The patient goes in to a light sleep with symmetrical sleep spindles, vertex central sharp transients and irregular slow activity.  Hyperventilation was not performed and intermittent photic stimulation were not performed.  IMPRESSION: This is a normal EEG.  No epileptiform activity is noted.    Thana Farr, MD [facility] [phone] 05/18/2013, 3:51 PM

## 2013-05-18 NOTE — Progress Notes (Addendum)
Subjective: Patient doing well.  Feels that she is at baseline.  Has had no further seizures.  Reports recently starting Mucinex-DM which is a new medication for her but has done nothing else unusual.  .  Objective: Current vital signs: BP 100/63  Pulse 84  Temp(Src) 98 F (36.7 C) (Oral)  Resp 17  Ht 5\' 4"  (1.626 m)  Wt 59.9 kg (132 lb 0.9 oz)  BMI 22.66 kg/m2  SpO2 96% Vital signs in last 24 hours: Temp:  [98 F (36.7 C)-100.4 F (38 C)] 98 F (36.7 C) (12/19 0807) Pulse Rate:  [84-128] 84 (12/19 0755) Resp:  [14-23] 17 (12/19 0755) BP: (98-114)/(45-78) 100/63 mmHg (12/19 0755) SpO2:  [96 %-100 %] 96 % (12/19 0755) Weight:  [57.607 kg (127 lb)-59.9 kg (132 lb 0.9 oz)] 59.9 kg (132 lb 0.9 oz) (12/19 0110)  Intake/Output from previous day: 12/18 0701 - 12/19 0700 In: 980 [P.O.:480; I.V.:500] Out: 200 [Urine:200] Intake/Output this shift:   Nutritional status: General  Neurologic Exam: Mental Status:  Alert, oriented, thought content appropriate. Speech fluent without evidence of aphasia. Able to follow 3 step commands without difficulty.  Cranial Nerves:  II: Discs flat bilaterally; Visual fields grossly normal, pupils equal, round, reactive to light and accommodation  III,IV, VI: ptosis not present, extra-ocular motions intact bilaterally  V,VII: smile symmetric, facial light touch sensation normal bilaterally  VIII: hearing normal bilaterally  IX,X: gag reflex present  XI: bilateral shoulder shrug  XII: midline tongue extension without atrophy or fasciculations  Motor:  5/5 throughout Sensory: Pinprick and light touch intact throughout, bilaterally  Deep Tendon Reflexes:  2+ throughout Plantars:  Right: downgoing    Left: downgoing  Cerebellar:  normal finger-to-nose, normal heel-to-shin test   Lab Results: Basic Metabolic Panel:  Recent Labs Lab 05/17/13 2015 05/18/13 0545  NA 137 139  K 4.0 3.5  CL 99 107  CO2 14* 21  GLUCOSE 112* 93  BUN 8 6   CREATININE 0.75 0.61  CALCIUM 8.8 8.1*    Liver Function Tests:  Recent Labs Lab 05/17/13 2015 05/18/13 0545  AST 30 29  ALT 28 25  ALKPHOS 103 80  BILITOT 0.3 0.4  PROT 7.4 6.0  ALBUMIN 3.8 3.0*   No results found for this basename: LIPASE, AMYLASE,  in the last 168 hours No results found for this basename: AMMONIA,  in the last 168 hours  CBC:  Recent Labs Lab 05/17/13 2015 05/18/13 0545  WBC 10.3 9.2  NEUTROABS 7.9*  --   HGB 12.7 10.7*  HCT 36.9 31.5*  MCV 95.3 95.2  PLT 240 222    Cardiac Enzymes: No results found for this basename: CKTOTAL, CKMB, CKMBINDEX, TROPONINI,  in the last 168 hours  Lipid Panel: No results found for this basename: CHOL, TRIG, HDL, CHOLHDL, VLDL, LDLCALC,  in the last 168 hours  CBG:  Recent Labs Lab 05/17/13 2028  GLUCAP 100*    Microbiology: Results for orders placed during the hospital encounter of 05/17/13  MRSA PCR SCREENING     Status: None   Collection Time    05/18/13  1:36 AM      Result Value Range Status   MRSA by PCR NEGATIVE  NEGATIVE Final   Comment:            The GeneXpert MRSA Assay (FDA     approved for NASAL specimens     only), is one component of a     comprehensive MRSA colonization  surveillance program. It is not     intended to diagnose MRSA     infection nor to guide or     monitor treatment for     MRSA infections.    Coagulation Studies:  Recent Labs  05/18/13 0545  LABPROT 13.7  INR 1.07    Imaging: Ct Head Wo Contrast  05/17/2013   CLINICAL DATA:  Seizure.  EXAM: CT HEAD WITHOUT CONTRAST  TECHNIQUE: Contiguous axial images were obtained from the base of the skull through the vertex without intravenous contrast.  COMPARISON:  None.  FINDINGS: No evidence of intracranial hemorrhage, brain edema, or other signs of acute infarction. No evidence of intracranial mass lesion or mass effect. No abnormal extraaxial fluid collections identified. Ventricles are normal in size. No  skull abnormality identified.  IMPRESSION: Negative noncontrast head CT.   Electronically Signed   By: Myles Rosenthal M.D.   On: 05/17/2013 21:06   Mr Laqueta Jean EA Contrast  05/18/2013   CLINICAL DATA:  New onset seizures  EXAM: MRI HEAD WITHOUT AND WITH CONTRAST  TECHNIQUE: Multiplanar, multiecho pulse sequences of the brain and surrounding structures were obtained without and with intravenous contrast.  CONTRAST:  12mL MULTIHANCE GADOBENATE DIMEGLUMINE 529 MG/ML IV SOLN  COMPARISON:  Prior CT from 05/17/2013  FINDINGS: A few scattered subcentimeter foci of T2/FLAIR hyperintensity are seen within the deep white matter of both frontal lobes (Series 6, image 18 in the left frontal lobe and image 20 in the right frontal lobe), nonspecific. No other focal parenchymal signal abnormality is identified. No mass lesion, midline shift, or extra-axial fluid collection identified. Ventricles are normal in size without evidence of hydrocephalus. No significant cerebral volume loss or atrophy identified. The hippocampal at are symmetric in size and appearance bilaterally without signal changes.  Craniocervical junction is widely patent and normal. Pituitary gland is within normal limits. The globes, optic nerves, and optic chiasm demonstrate a normal appearance with normal signal intensity.  No diffusion-weighted signal abnormality to suggest acute intracranial infarct identified. Normal flow voids are seen within the intracranial vasculature. Gray-white matter differentiation is maintained. No acute intracranial hemorrhage on gradient echo sequence.  No abnormal enhancement identified within the brain. Parenchymal signal abnormality identified.  IMPRESSION: Normal contrast enhanced MRI of the brain with no acute intracranial infarct or other process identified.   Electronically Signed   By: Rise Mu M.D.   On: 05/18/2013 01:59    Medications:  I have reviewed the patient's current medications. Scheduled: .  amoxicillin-clavulanate  1 tablet Oral TID  . enoxaparin (LOVENOX) injection  40 mg Subcutaneous Q24H  . sodium chloride  3 mL Intravenous Q12H    Assessment/Plan: Patient without further seizure activity.  MRI of the brain reviewed and unremarkable.  Neurological examination is nonfocal.  EEG pending.    Recommendations: 1.  Will follow up results of EEG with plans for discharge and no start of anticonvulsants if report is normal.    LOS: 1 day   Thana Farr, MD [facility] [phone] 05/18/2013  10:33 AM

## 2013-05-18 NOTE — Progress Notes (Signed)
Pt. Was placed on Droplet precautions due to congested cough, sore throat, and fever.  Flu test was negative, therefore droplet precautions have been discontinued.

## 2013-05-18 NOTE — Progress Notes (Signed)
Utilization review completed.  

## 2013-05-18 NOTE — Progress Notes (Signed)
Routine EEG completed.  

## 2013-05-19 DIAGNOSIS — J329 Chronic sinusitis, unspecified: Secondary | ICD-10-CM

## 2013-05-19 NOTE — Discharge Summary (Signed)
Physician Discharge Summary  Patient ID: [name] [name] MRN: [id number] DOB/AGE: Oct 04, 1970 42 y.o.  Admit date: 05/17/2013 Discharge date: 05/19/2013  Primary Care Physician:  Neena Rhymes, MD  Discharge Diagnoses:    . Seizures activity    Acute sinusitis/bronchitis  . DEPRESSION  Consults:  Neurology, Dr. Thad Ranger   Recommendations for Outpatient Follow-up:  Patient should be referred to followup with outpatient neurology if she has any further seizure activity  Allergies:   Allergies  Allergen Reactions  . Ciprofloxacin Nausea Only     Discharge Medications:   Medication List         acetaminophen 500 MG tablet  Commonly known as:  TYLENOL  Take 1,000 mg by mouth daily as needed for mild pain.     ALPRAZolam 0.5 MG tablet  Commonly known as:  XANAX  Take 0.5 mg by mouth daily as needed for anxiety.     amoxicillin-clavulanate 500-125 MG per tablet  Commonly known as:  AUGMENTIN  Take 1 tablet (500 mg total) by mouth 3 (three) times daily.     dextromethorphan-guaiFENesin 30-600 MG per 12 hr tablet  Commonly known as:  MUCINEX DM  Take 1 tablet by mouth 2 (two) times daily as needed for cough.     fexofenadine-pseudoephedrine 60-120 MG per tablet  Commonly known as:  ALLEGRA-D  Take 1 tablet by mouth 2 (two) times daily as needed (for allergies/congestion).         Brief H and P: For complete details please refer to admission H and P, but in brief patient is a 42 year old female with past medical history depression, anxiety, GERD presented from home. Patient mentioned that she was not feeling well and for last one week having upper respiratory infection with hoarseness of voice. Patient started taking Allegra and Mucinex. Patient's family member found the patient confused, not in her usual state, subsequently she started having generalized jerking movements which resolved and patient was not responsive. She was brought to ER.  Hospital Course:   Seizures - suspected to to respiratory illness, lack of sleep and stress. Neurology was consulted and patient underwent EEG. EEG was negative and patient was not recommended any antiseizure medications at this time. She did not have any further seizure activity recurrent in the hospital. She had nonfocal neurological examination. Patient is recommended to follow up outpatient with neurology if she has any further seizure activity.   Sinusitis/ Acute bronchitis -influenza was negative, patient was started on Augmentin. She is starting to feel significantly improved.  DEPRESSION - stable    Day of Discharge BP 106/73  Pulse 69  Temp(Src) 98 F (36.7 C) (Oral)  Resp 18  Ht 5\' 4"  (1.626 m)  Wt 60.406 kg (133 lb 2.7 oz)  BMI 22.85 kg/m2  SpO2 97%  Physical Exam: General: Alert and awake oriented x3 not in any acute distress. HEENT: anicteric sclera, pupils reactive to light and accommodation CVS: S1-S2 clear no murmur rubs or gallops Chest: clear to auscultation bilaterally, no wheezing rales or rhonchi Abdomen: soft nontender, nondistended, normal bowel sounds Extremities: no cyanosis, clubbing or edema noted bilaterally Neuro: Cranial nerves II-XII intact, no focal neurological deficits   The results of significant diagnostics from this hospitalization (including imaging, microbiology, ancillary and laboratory) are listed below for reference.    LAB RESULTS: Basic Metabolic Panel:  Recent Labs Lab 05/17/13 2015 05/18/13 0545  NA 137 139  K 4.0 3.5  CL 99 107  CO2 14* 21  GLUCOSE 112* 93  BUN 8 6  CREATININE 0.75 0.61  CALCIUM 8.8 8.1*   Liver Function Tests:  Recent Labs Lab 05/17/13 2015 05/18/13 0545  AST 30 29  ALT 28 25  ALKPHOS 103 80  BILITOT 0.3 0.4  PROT 7.4 6.0  ALBUMIN 3.8 3.0*   No results found for this basename: LIPASE, AMYLASE,  in the last 168 hours No results found for this basename: AMMONIA,  in the last 168 hours CBC:  Recent  Labs Lab 05/17/13 2015 05/18/13 0545  WBC 10.3 9.2  NEUTROABS 7.9*  --   HGB 12.7 10.7*  HCT 36.9 31.5*  MCV 95.3 95.2  PLT 240 222   Cardiac Enzymes: No results found for this basename: CKTOTAL, CKMB, CKMBINDEX, TROPONINI,  in the last 168 hours BNP: No components found with this basename: POCBNP,  CBG:  Recent Labs Lab 05/17/13 2028  GLUCAP 100*    Significant Diagnostic Studies:  Ct Head Wo Contrast  05/17/2013   CLINICAL DATA:  Seizure.  EXAM: CT HEAD WITHOUT CONTRAST  TECHNIQUE: Contiguous axial images were obtained from the base of the skull through the vertex without intravenous contrast.  COMPARISON:  None.  FINDINGS: No evidence of intracranial hemorrhage, brain edema, or other signs of acute infarction. No evidence of intracranial mass lesion or mass effect. No abnormal extraaxial fluid collections identified. Ventricles are normal in size. No skull abnormality identified.  IMPRESSION: Negative noncontrast head CT.   Electronically Signed   By: Myles Rosenthal M.D.   On: 05/17/2013 21:06   Mr Laqueta Jean ZO Contrast  05/18/2013   CLINICAL DATA:  New onset seizures  EXAM: MRI HEAD WITHOUT AND WITH CONTRAST  TECHNIQUE: Multiplanar, multiecho pulse sequences of the brain and surrounding structures were obtained without and with intravenous contrast.  CONTRAST:  12mL MULTIHANCE GADOBENATE DIMEGLUMINE 529 MG/ML IV SOLN  COMPARISON:  Prior CT from 05/17/2013  FINDINGS: A few scattered subcentimeter foci of T2/FLAIR hyperintensity are seen within the deep white matter of both frontal lobes (Series 6, image 18 in the left frontal lobe and image 20 in the right frontal lobe), nonspecific. No other focal parenchymal signal abnormality is identified. No mass lesion, midline shift, or extra-axial fluid collection identified. Ventricles are normal in size without evidence of hydrocephalus. No significant cerebral volume loss or atrophy identified. The hippocampal at are symmetric in size and  appearance bilaterally without signal changes.  Craniocervical junction is widely patent and normal. Pituitary gland is within normal limits. The globes, optic nerves, and optic chiasm demonstrate a normal appearance with normal signal intensity.  No diffusion-weighted signal abnormality to suggest acute intracranial infarct identified. Normal flow voids are seen within the intracranial vasculature. Gray-white matter differentiation is maintained. No acute intracranial hemorrhage on gradient echo sequence.  No abnormal enhancement identified within the brain. Parenchymal signal abnormality identified.  IMPRESSION: Normal contrast enhanced MRI of the brain with no acute intracranial infarct or other process identified.   Electronically Signed   By: Rise Mu M.D.   On: 05/18/2013 01:59       Disposition and Follow-up:     Discharge Orders   Future Orders Complete By Expires   Diet - low sodium heart healthy  As directed    Increase activity slowly  As directed        DISPOSITION: Home DIET: Heart healthy diet   DISCHARGE FOLLOW-UP Follow-up Information   Follow up with Neena Rhymes, MD. Schedule an appointment as soon as possible for a visit in 2  weeks. (for hospital follow-up)    Specialty:  Family Medicine   Contact information:   [phone] [place] [address] [phone]       Time spent on Discharge: 32 minutes  Signed:   Daveda Larock M.D. [facility] 05/19/2013, 8:43 AM Pager: [phone]

## 2013-05-19 NOTE — Progress Notes (Signed)
Patient ready for discharge home; discharge instructions given and patient stated her understanding and to f/u with her PCP within 2 weeks; accompanied home with a friend.

## 2013-05-24 LAB — CULTURE, BLOOD (ROUTINE X 2)
Culture: NO GROWTH
Culture: NO GROWTH

## 2013-06-01 ENCOUNTER — Telehealth: Payer: Self-pay | Admitting: Obstetrics & Gynecology

## 2013-06-01 NOTE — Telephone Encounter (Signed)
Patient was hospitalized on 12/18 for seizures. She has completed neuro work up. Has been experiencing fatigue and "fogginess". She feels this may be related to hormones. Not started on anti-seizure medications per reviewed hospital note. To be followed up by pcp.  Would like office visit to discuss with Dr. Sabra [name] and have blood draw to see if finally in menopause. Appointment scheduled.  Routing to provider for final review. Patient agreeable to disposition. Will close encounter

## 2013-06-01 NOTE — Telephone Encounter (Signed)
Patient has some questions regarding menopausal symptoms.

## 2013-06-08 ENCOUNTER — Ambulatory Visit (INDEPENDENT_AMBULATORY_CARE_PROVIDER_SITE_OTHER): Payer: 59 | Admitting: Obstetrics & Gynecology

## 2013-06-08 VITALS — BP 118/70 | HR 52 | Resp 16 | Ht 63.75 in | Wt 131.8 lb

## 2013-06-08 DIAGNOSIS — N912 Amenorrhea, unspecified: Secondary | ICD-10-CM

## 2013-06-08 DIAGNOSIS — J322 Chronic ethmoidal sinusitis: Secondary | ICD-10-CM

## 2013-06-08 DIAGNOSIS — R42 Dizziness and giddiness: Secondary | ICD-10-CM

## 2013-06-08 LAB — CBC
HCT: 38.4 % (ref 36.0–46.0)
Hemoglobin: 13.3 g/dL (ref 12.0–15.0)
MCH: 32.5 pg (ref 26.0–34.0)
MCHC: 34.6 g/dL (ref 30.0–36.0)
MCV: 93.9 fL (ref 78.0–100.0)
PLATELETS: 262 10*3/uL (ref 150–400)
RBC: 4.09 MIL/uL (ref 3.87–5.11)
RDW: 13.4 % (ref 11.5–15.5)
WBC: 5.4 10*3/uL (ref 4.0–10.5)

## 2013-06-08 LAB — MAGNESIUM: Magnesium: 1.8 mg/dL (ref 1.5–2.5)

## 2013-06-08 MED ORDER — AZITHROMYCIN 250 MG PO TABS: ORAL_TABLET | ORAL | Status: DC

## 2013-06-08 MED ORDER — ESCITALOPRAM OXALATE 10 MG PO TABS
10.0000 mg | ORAL_TABLET | Freq: Every day | ORAL | Status: DC
Start: 2013-06-08 — End: 2013-09-18

## 2013-06-08 NOTE — Progress Notes (Signed)
43 y.o. Divorced Caucasian female [phone] here to discuss several new symptoms/issues.  Pt has very stressful job which I feel has contributed to GI problems over the last few years.  She has undergone a fairly extensive w/u for nausea that has not shown anything.  This is not her current concern but is still present.  Pt report starting 05/12/13, she had URI symptoms.  She started taking Mucinex D and Clariton D on 12/19 after talking with pharmacist at her local pharmacy.  She had a witnessed seizure (by female colleague) who called 911.  She was seen at the ER.  (records all reviewed.)  She had tachycardia and dizziness then as well.  CT of head was performed as well as labs.  CT was negative.  Pt was seen by neurology and observed.  MRI was performed and was negative.  EEG was negative.  Seizure activity stopped on own and pt was never given anti-seizure medication.  Patient D/C-ed home.  Is still allowed to drive.  She reports the neurologist thought this might be related to the medications she was taking.  Still feeling some face pressure and nausea.  Has cough and facial pressure as well.  Reports she is also having what feels like "brain fog", hot flashes, dizziness with eating.  She came today to see if i would test for menopause.  Pt has hx of endometrial ablation and does not cycle at all.    Also, she started 20mg  Lexapro for anxiety.  We have talked about this off and on for several years.  She had such worsening nausea that she stopped after four or five days.  Advised this is a temporary side effect that typically resolved in 7 days.  She really feels she needs something and wants to restart.  O: Healthy WD,WN female Affect: normal CV:  RRR with M/R/G Lungs: CTA bilaterally  A:Atypical vasomotor symptoms Anxiety Continued URI symptoms  P: FSH, estradiol, TSH CBC Magnesium Trial of antivert Lexapro 10mg  daily.  Pt knows to expect worsening nausea for one week. Zpak  ~30 minutes  spent with patient >50% of time was in face to face discussion of above.

## 2013-06-09 LAB — ESTRADIOL: Estradiol: 16.6 pg/mL

## 2013-06-09 LAB — TSH: TSH: 1.309 u[IU]/mL (ref 0.350–4.500)

## 2013-06-09 LAB — FOLLICLE STIMULATING HORMONE: FSH: 39 m[IU]/mL

## 2013-06-12 ENCOUNTER — Telehealth: Payer: Self-pay

## 2013-06-12 NOTE — Telephone Encounter (Signed)
Not able to leave message-mailbox on phone is full

## 2013-06-12 NOTE — Telephone Encounter (Signed)
Message copied by Robley Fries on Tue Jun 12, 2013  3:04 PM ------      Message from: Megan Salon      Created: Tue Jun 12, 2013  2:35 PM       Inform CBC normal, magnesium normal, tsh normal.  FSH 39 and estradiol is low.  I want to start estradiol 1.0mg  daily.  Rx placed.  F/U 1 month. ------

## 2013-06-17 ENCOUNTER — Encounter: Payer: Self-pay | Admitting: Obstetrics & Gynecology

## 2013-06-21 NOTE — Telephone Encounter (Signed)
Patient notified

## 2013-07-04 ENCOUNTER — Encounter: Payer: Self-pay | Admitting: Podiatrist

## 2013-07-04 ENCOUNTER — Ambulatory Visit (INDEPENDENT_AMBULATORY_CARE_PROVIDER_SITE_OTHER): Payer: 59 | Admitting: Podiatrist

## 2013-07-04 VITALS — BP 114/71 | HR 69 | Resp 16 | Ht 64.0 in | Wt 128.0 lb

## 2013-07-04 DIAGNOSIS — B351 Tinea unguium: Secondary | ICD-10-CM

## 2013-07-04 MED ORDER — EFINACONAZOLE 10 % EX SOLN: 1.0000 [drp] | Freq: Every day | CUTANEOUS | Status: DC

## 2013-07-04 NOTE — Progress Notes (Signed)
   Subjective:    Patient ID: [name] [name], female    DOB: September 05, 1970, 43 y.o.   MRN: [id number]  HPI Comments: "I have a nail problem"  Pt states that she has thick, discolored nails, 1st and 2nd bilateral for several years. She has tried Lamisil, but didn't notice any changes. They are not painful. She does keep them polished all the time. Interested in the laser. she was on Lamisil in years past but had an elevated liver enzymes with the medication and was taken off.  She has tried all otc topicals with no improvement.  She is interested in the laser at today's visit.    Review of Systems  Gastrointestinal: Positive for constipation.  All other systems reviewed and are negative.       Objective:   Physical Exam GENERAL APPEARANCE: Alert, conversant. Appropriately groomed. No acute distress.  VASCULAR: Pedal pulses palpable at 2/4 DP and PT bilateral.  Capillary refill time is immediate to all digits,  Proximal to distal cooling it warm to warm.  Digital hair growth is present bilateral  NEUROLOGIC: sensation is intact epicritically and protectively to 5.07 monofilament at 5/5 sites bilateral.  Light touch is intact bilateral, vibratory sensation intact bilateral, achilles tendon reflex is intact bilateral.  MUSCULOSKELETAL: acceptable muscle strength, tone and stability bilateral.  Intrinsic muscluature intact bilateral.  Rectus appearance of foot and digits noted bilateral.   DERMATOLOGIC: bilateral hallux and 2nd nails are thick, yellow, discolored with subungual debris present.  Remainder of toenails are normal    Assessment & Plan:  Mycotic nails x 2 Plan:  Took a sample of the toenail to send for culture.  Will intiate laser therapy at her convenience-  Gave information regarding possiblity of having to do an oral medication with the laser but will wait to see how she does with the laser.  Would be diflucan due to liver function issues in the past.

## 2013-07-04 NOTE — Patient Instructions (Addendum)
Your prescription has been sent to [facility] as it is the most affordable place to fill your prescription for this particular medication.  Please give them a call at [phone] to confirm insurance benefits and to ensure correct delivery information.  If they have not heard from you, they will contact you to be sure you want them to fill the medication for you. Calling them first greatly speeds up the time it will take for you to receive your medication so it can begin working for you!    Their phone number is [phone].  Their pharmacy hours are Mon-Thurs 8-7; Fri 8-6pm.        Onychomycosis/Fungal Toenails  WHAT IS IT? An infection that lies within the keratin of your nail plate that is caused by a fungus.  WHY ME? Fungal infections affect all ages, sexes, races, and creeds.  There may be many factors that predispose you to a fungal infection such as age, coexisting medical conditions such as diabetes, or an autoimmune disease; stress, medications, fatigue, genetics, etc.  Bottom line: fungus thrives in a warm, moist environment and your shoes offer such a location.  IS IT CONTAGIOUS? Theoretically, yes.  You do not want to share shoes, nail clippers or files with someone who has fungal toenails.  Walking around barefoot in the same room or sleeping in the same bed is unlikely to transfer the organism.  It is important to realize, however, that fungus can spread easily from one nail to the next on the same foot.  HOW DO WE TREAT THIS?  There are several ways to treat this condition.  Treatment may depend on many factors such as age, medications, pregnancy, liver and kidney conditions, etc.  It is best to ask your doctor which options are available to you.  1. No treatment.   Unlike many other medical concerns, you can live with this condition.  However for many people this can be a painful condition and may lead to ingrown toenails or a bacterial infection.  It is recommended  that you keep the nails cut short to help reduce the amount of fungal nail. 2. Topical treatment.  These range from herbal remedies to prescription strength nail lacquers.  About 40-50% effective, topicals require twice daily application for approximately 9 to 12 months or until an entirely new nail has grown out.  The most effective topicals are medical grade medications available through physicians offices. 3. Oral antifungal medications.  With an 80-90% cure rate, the most common oral medication requires 3 to 4 months of therapy and stays in your system for a year as the new nail grows out.  Oral antifungal medications do require blood work to make sure it is a safe drug for you.  A liver function panel will be performed prior to starting the medication and after the first month of treatment.  It is important to have the blood work performed to avoid any harmful side effects.  In general, this medication safe but blood work is required. 4. Laser Therapy.  This treatment is performed by applying a specialized laser to the affected nail plate.  This therapy is noninvasive, fast, and non-painful.  It is not covered by insurance and is therefore, out of pocket.  The results have been very good with a 80-95% cure rate.  [facility] is the only practice in the area to offer this therapy. 5. Permanent Nail Avulsion.  Removing the entire nail so that a new nail  will not grow back.

## 2013-07-05 ENCOUNTER — Telehealth: Payer: Self-pay | Admitting: *Deleted

## 2013-07-05 NOTE — Telephone Encounter (Signed)
Left 2nd toenail fragment sent to [facility] for definitive diagnosis of fungal element.

## 2013-07-12 ENCOUNTER — Ambulatory Visit (INDEPENDENT_AMBULATORY_CARE_PROVIDER_SITE_OTHER): Payer: 59 | Admitting: Obstetrics & Gynecology

## 2013-07-12 VITALS — BP 102/62 | HR 60 | Resp 16 | Ht 63.5 in | Wt 133.2 lb

## 2013-07-12 DIAGNOSIS — A499 Bacterial infection, unspecified: Secondary | ICD-10-CM

## 2013-07-12 DIAGNOSIS — N760 Acute vaginitis: Secondary | ICD-10-CM

## 2013-07-12 DIAGNOSIS — R110 Nausea: Secondary | ICD-10-CM

## 2013-07-12 DIAGNOSIS — B9689 Other specified bacterial agents as the cause of diseases classified elsewhere: Secondary | ICD-10-CM

## 2013-07-12 MED ORDER — CLOBETASOL PROPIONATE 0.05 % EX OINT: 1.0000 "application " | TOPICAL_OINTMENT | CUTANEOUS | Status: DC | PRN

## 2013-07-12 MED ORDER — METRONIDAZOLE 0.75 % VA GEL: VAGINAL | Status: DC

## 2013-07-12 NOTE — Progress Notes (Signed)
43 y.o. Divorced Caucasian female [phone] here for follow up after starting both lexapro.  Reports dizziness is gone.  This got better within a day or two of starting the antibiotics.  This is completely gone and she did finish the antibiotics.  Reports inner ear popped a lot with the antibiotics.  Started Lexapro about a month ago.  Anxiety is much better.  Persistent nausea is gone.  This is first time in several years that this is  Using 1/2 dosage of Miralax daily in a cup of coffee and 1 cup of Fiber One cereal.  Having bowel movements much more regularly.    Really does feel better for the first time in several months.  Complaint of ammonia smelling vaginal discharge.  Worse with intercourse.  Really doesn't want physical exam today.  O: Healthy WD,WN female Affect: normal  A: Nausea, improved Probable BV Anxiety  P: metrogel 0.75% intravaginally for 5 nights Rx for clobetasol given. 1/2 tab of 10mg  of Lexapro.  She will let me know if this  Estroven pm daily discussed.  She really doesn't want to be on HRT at this time.  ~15 minutes spent with patient >50% of time was in face to face discussion of above.

## 2013-07-13 ENCOUNTER — Encounter: Payer: Self-pay | Admitting: Obstetrics & Gynecology

## 2013-07-20 ENCOUNTER — Encounter: Payer: Self-pay | Admitting: Podiatrist

## 2013-07-20 ENCOUNTER — Ambulatory Visit: Payer: 59 | Admitting: Podiatrist

## 2013-07-20 VITALS — BP 107/64 | HR 60 | Resp 12

## 2013-07-20 DIAGNOSIS — B351 Tinea unguium: Secondary | ICD-10-CM

## 2013-07-24 NOTE — Progress Notes (Signed)
Subjective: Patient presents today for her first laser treatment on bilateral hallux nails, and second toes bilateral for several years. She is unable to take oral medications and we are treating her with laser therapy and topical therapies.  Objective: Nails as stated appear to be mycotic. Neurovascular status intact and unchanged.  Assessment: Mycotic nails x4  Laserering of Toenails bilateral carried out at today's visit via Q-switch YAG laser by QClear Laser at continuous Patient and staff were wearing appropriate laser protective goggles/eyewear Laser device was tested prior to use and safety protocols were followed Frequency of 5 Hz, Level 4, Joules 7.5 delivered.        The patient tolerated the lasering well -- i will see her back in 1 month for laser 2 of nails

## 2013-07-27 ENCOUNTER — Telehealth: Payer: Self-pay | Admitting: *Deleted

## 2013-07-27 NOTE — Telephone Encounter (Addendum)
Dr Valentina Lucks states preliminary fungal results are negative.  I called [phone] no voicemail available, and [phone] work  Stated pt was gone for the day.  I informed pt of Dr Shaune Pollack review of preliminary fungal results.

## 2013-08-17 ENCOUNTER — Ambulatory Visit (INDEPENDENT_AMBULATORY_CARE_PROVIDER_SITE_OTHER): Payer: 59 | Admitting: Podiatrist

## 2013-08-17 ENCOUNTER — Encounter: Payer: Self-pay | Admitting: Podiatrist

## 2013-08-17 ENCOUNTER — Ambulatory Visit: Payer: 59 | Admitting: Podiatrist

## 2013-08-17 VITALS — BP 101/52 | HR 80 | Resp 16

## 2013-08-17 DIAGNOSIS — B351 Tinea unguium: Secondary | ICD-10-CM

## 2013-08-17 NOTE — Progress Notes (Signed)
Subjective: Patient presents today for her second laser treatment on bilateral hallux nails, and 2nd toes bilateral. She is unable to take oral medications and we are treating her with laser therapy and topical therapies.   Objective: Nails as stated appear to be mycotic. Neurovascular status intact and unchanged.    Assessment: Mycotic nails x4    Laserering of Toenails bilateral carried out at today's visit via Q-switch YAG laser by QClear Laser at continuous  Patient and staff were wearing appropriate laser protective goggles/eyewear  Laser device was tested prior to use and safety protocols were followed  Frequency of 5 Hz, Level 4, Joules 7.5 delivered.     The patient tolerated the lasering well -- i will see her back in 1 month for laser 3 of nails

## 2013-09-18 ENCOUNTER — Other Ambulatory Visit: Payer: Self-pay | Admitting: Obstetrics & Gynecology

## 2013-09-18 NOTE — Telephone Encounter (Signed)
Patient given #30/2RF on 06/08/13. Had f/u on 07/12/13-taking 1/2 tab daily-doing well. Please advise if ok to refill. AEX scheduled for 10/30/13//kn

## 2013-09-21 ENCOUNTER — Ambulatory Visit (INDEPENDENT_AMBULATORY_CARE_PROVIDER_SITE_OTHER): Payer: 59 | Admitting: Podiatrist

## 2013-09-21 DIAGNOSIS — B351 Tinea unguium: Secondary | ICD-10-CM

## 2013-09-28 NOTE — Progress Notes (Signed)
Subjective: Patient presents today for her second laser treatment on bilateral hallux nails, and 2nd toes bilateral. She is unable to take oral medications and we are treating her with laser therapy and topical therapies.  Objective: Nails as stated appear to be mycotic. Neurovascular status intact and unchanged.  Assessment: Mycotic nails x4  Laserering of Toenails bilateral carried out at today's visit via Q-switch YAG laser by QClear Laser at continuous  Patient and staff were wearing appropriate laser protective goggles/eyewear  Laser device was tested prior to use and safety protocols were followed  Frequency of 5 Hz, Level 4, Joules 7.5 delivered.  The patient tolerated the lasering well -- i will see her back in 1 month for laser 3 of nails

## 2013-10-30 ENCOUNTER — Ambulatory Visit (INDEPENDENT_AMBULATORY_CARE_PROVIDER_SITE_OTHER): Payer: 59 | Admitting: Obstetrics & Gynecology

## 2013-10-30 ENCOUNTER — Encounter: Payer: Self-pay | Admitting: Obstetrics & Gynecology

## 2013-10-30 ENCOUNTER — Encounter: Payer: Self-pay | Admitting: Podiatrist

## 2013-10-30 VITALS — BP 104/62 | HR 56 | Resp 16 | Ht 63.75 in | Wt 135.6 lb

## 2013-10-30 DIAGNOSIS — Z0000 Encounter for general adult medical examination without abnormal findings: Secondary | ICD-10-CM

## 2013-10-30 DIAGNOSIS — Z124 Encounter for screening for malignant neoplasm of cervix: Secondary | ICD-10-CM

## 2013-10-30 DIAGNOSIS — Z01419 Encounter for gynecological examination (general) (routine) without abnormal findings: Secondary | ICD-10-CM

## 2013-10-30 LAB — POCT URINALYSIS DIPSTICK
Bilirubin, UA: NEGATIVE
Glucose, UA: NEGATIVE
KETONES UA: NEGATIVE
Nitrite, UA: NEGATIVE
PH UA: 5
PROTEIN UA: NEGATIVE
UROBILINOGEN UA: NEGATIVE

## 2013-10-30 LAB — HEMOGLOBIN, FINGERSTICK: Hemoglobin, fingerstick: 12.5 g/dL (ref 12.0–16.0)

## 2013-10-30 MED ORDER — ALPRAZOLAM 0.5 MG PO TABS
0.5000 mg | ORAL_TABLET | Freq: Every day | ORAL | Status: DC | PRN
Start: 2013-10-30 — End: 2014-03-11

## 2013-10-30 MED ORDER — ESCITALOPRAM OXALATE 5 MG PO TABS: 5.0000 mg | ORAL_TABLET | Freq: Two times a day (BID) | ORAL | Status: DC

## 2013-10-30 NOTE — Progress Notes (Signed)
43 y.o. [id number] Legally SeparatedCaucasianF here for annual exam.  No bleeding.  Doing well with Lexapro.  Having some libido issues with this.  Wants to decrease the dose to see if this helps.  D/w pt   GI issues are much better.  Occasionally has nausea.  Eating is better.  Pain is much, much better.  Dexilant has really helped.     Patient's last menstrual period was 06/01/2011.          Sexually active: yes  The current method of family planning is bilateral salpingectomy    Exercising: yes   Smoker:  Former smoker  Health Maintenance: Pap:  10/21/11 WNL/negative HR HPV History of abnormal Pap:  no MMG:  12/15/12-MMG, 01/10/13 additional views/US-decided not to do the cyst aspiration-normal Colonoscopy:  2014-repeat 10 years BMD:   none TDaP:  ? Screening Labs: none needed, Hb today: 12.5, Urine today: WBC-trace, RBC-trace   reports that she quit smoking about 9 months ago. She has never used smokeless tobacco. She reports that she drinks alcohol. She reports that she does not use illicit drugs.  Past Medical History  Diagnosis Date  . Depression   . Anxiety   . IBS (irritable bowel syndrome)   . GERD (gastroesophageal reflux disease)     Past Surgical History  Procedure Laterality Date  . Cesarean section    . Dilation and curettage of uterus    . Endometrial biopsy  09/19/12    neg  . Dilitation & currettage/hystroscopy with novasure ablation N/A 10/31/2012    Procedure: DILATATION & CURETTAGE/HYSTEROSCOPY WITH NOVASURE ABLATION;  Surgeon: Lyman Speller, MD;  Location: [facility];  Service: Gynecology;  Laterality: N/A;  Dr Suzanne Boron Diarmid to start case with a cysto at beginning of case while patient under anesthesia.  . Laparoscopy Bilateral 10/31/2012    Procedure: LAPAROSCOPY OPERATIVE;  Surgeon: Lyman Speller, MD;  Location: [facility];  Service: Gynecology;  Laterality: Bilateral;  . Bilateral salpingectomy Bilateral 10/31/2012    Procedure: BILATERAL SALPINGECTOMY;  Surgeon:  Lyman Speller, MD;  Location: [facility];  Service: Gynecology;  Laterality: Bilateral;  . Cysto with hydrodistension N/A 10/31/2012    Procedure: CYSTOSCOPY/HYDRODISTENSION;  Surgeon: Reece Packer, MD;  Location: [facility];  Service: Urology;  Laterality: N/A;    Current Outpatient Prescriptions  Medication Sig Dispense Refill  . acetaminophen (TYLENOL) 500 MG tablet Take 1,000 mg by mouth daily as needed for mild pain.      Marland Kitchen ALPRAZolam (XANAX) 0.5 MG tablet Take 0.5 mg by mouth daily as needed for anxiety.       . clobetasol ointment (TEMOVATE) 7.61 % Apply 1 application topically as needed.  15 g  1  . DEXILANT 60 MG capsule       . Efinaconazole 10 % SOLN Apply 1 drop topically daily.  4 mL  4  . escitalopram (LEXAPRO) 10 MG tablet TAKE 1 TABLET BY MOUTH EVERY DAY  30 tablet  12  . ondansetron (ZOFRAN) 4 MG tablet 4 mg as needed.       No current facility-administered medications for this visit.    Family History  Problem Relation Age of Onset  . Stroke Father   . Non-Hodgkin's lymphoma Father   . Hypertension Father   . Kidney disease Father   . Club foot Son   . Hypertension Mother     ROS:  Pertinent items are noted in HPI.  Otherwise, a comprehensive ROS was negative.  Exam:  BP 104/62  Pulse 56  Resp 16  Ht 5' 3.75" (1.619 m)  Wt 135 lb 9.6 oz (61.508 kg)  BMI 23.47 kg/m2  LMP 06/01/2011  Weight change: +12#  Height: 5' 3.75" (161.9 cm)  Ht Readings from Last 3 Encounters:  10/30/13 5' 3.75" (1.619 m)  07/12/13 5' 3.5" (1.613 m)  07/04/13 5\' 4"  (1.626 m)    General appearance: alert, cooperative and appears stated age Head: Normocephalic, without obvious abnormality, atraumatic Neck: no adenopathy, supple, symmetrical, trachea midline and thyroid normal to inspection and palpation Lungs: clear to auscultation bilaterally Breasts: normal appearance, no masses or tenderness Heart: regular rate and rhythm Abdomen: soft, non-tender; bowel sounds normal; no  masses,  no organomegaly Extremities: extremities normal, atraumatic, no cyanosis or edema Skin: Skin color, texture, turgor normal. No rashes or lesions Lymph nodes: Cervical, supraclavicular, and axillary nodes normal. No abnormal inguinal nodes palpated Neurologic: Grossly normal   Pelvic: External genitalia:  no lesions              Urethra:  normal appearing urethra with no masses, tenderness or lesions              Bartholins and Skenes: normal                 Vagina: normal appearing vagina with normal color and discharge, no lesions              Cervix: no lesions              Pap taken: yes Bimanual Exam:  Uterus:  normal size, contour, position, consistency, mobility, non-tender              Adnexa: normal adnexa and no mass, fullness, tenderness               Rectovaginal: Confirms               Anus:  normal sphincter tone, no lesions  A:  Well Woman with normal exam Anxiety IBS H/O endometrial ablation and bilateral salpingectomy  P:   Mammogram yearly.  H/O recurrent breast cysts Xanax 0.5mg  1/2 to 1 tab prn #30/2Rd Lexapro 5mg  BID #60/12RF pap smear only today.  H/o neg HR HPV 2013. return annually or prn  An After Visit Summary was printed and given to the patient.

## 2013-10-30 NOTE — Patient Instructions (Addendum)

## 2013-11-01 LAB — IPS PAP SMEAR ONLY

## 2013-12-21 ENCOUNTER — Ambulatory Visit: Payer: 59 | Admitting: Podiatrist

## 2014-01-25 ENCOUNTER — Ambulatory Visit: Payer: 59 | Admitting: Podiatrist

## 2014-01-25 DIAGNOSIS — B351 Tinea unguium: Secondary | ICD-10-CM

## 2014-01-25 NOTE — Patient Instructions (Signed)

## 2014-01-25 NOTE — Progress Notes (Signed)
PT IS HERE FOR LASER TREATM,ENT   Subjective: Patient presents today for her third laser treatment on bilateral hallux nails, and 2nd toes bilateral. She is unable to take oral medications and we are treating her with laser therapy and topical therapies.  Objective: Nails as stated appear to be mycotic. Neurovascular status intact and unchanged.  Assessment: Mycotic nails x4  Laserering of Toenails bilateral carried out at today's visit via Q-switch YAG laser by QClear Laser at continuous  Patient and staff were wearing appropriate laser protective goggles/eyewear  Laser device was tested prior to use and safety protocols were followed  Frequency of 5 Hz, Level 4, Joules 7.5 delivered.  The patient tolerated the lasering well -- i will see her back in 2 months for laser  # 4 of nails

## 2014-02-22 ENCOUNTER — Ambulatory Visit (INDEPENDENT_AMBULATORY_CARE_PROVIDER_SITE_OTHER): Payer: 59 | Admitting: Podiatrist

## 2014-02-22 DIAGNOSIS — B351 Tinea unguium: Secondary | ICD-10-CM

## 2014-02-22 NOTE — Progress Notes (Signed)
Subjective: Patient presents today for her third laser treatment on bilateral hallux nails, and 2nd toes bilateral.  Left hallux nail is slow growing.  There is a crease where the 2nd toenails are starting to grow out. She is unable to take oral medications and we are treating her with laser therapy and topical therapies.  Objective: Nails as stated appear to be mycotic. Neurovascular status intact and unchanged.  Assessment: Mycotic nails x4  Laserering of Toenails bilateral carried out at today's visit via Q-switch YAG laser by QClear Laser at continuous  Patient and staff were wearing appropriate laser protective goggles/eyewear  Laser device was tested prior to use and safety protocols were followed  Frequency of 5 Hz, Level 4, Joules 7.5 delivered.  The patient tolerated the lasering well -- i will see her back in 2 month for retreat/re laser of toenails

## 2014-03-11 ENCOUNTER — Other Ambulatory Visit: Payer: Self-pay | Admitting: Obstetrics & Gynecology

## 2014-03-11 NOTE — Telephone Encounter (Signed)
Last AEX: 10/30/13 Last refill:10/30/13 #30 X 2 Current AEX:11/18/14  Please advise

## 2014-03-14 NOTE — Telephone Encounter (Signed)
RX faxed to pharmacy.

## 2014-04-01 ENCOUNTER — Encounter: Payer: Self-pay | Admitting: Obstetrics & Gynecology

## 2014-04-08 ENCOUNTER — Other Ambulatory Visit: Payer: Self-pay

## 2014-04-08 DIAGNOSIS — Z1231 Encounter for screening mammogram for malignant neoplasm of breast: Secondary | ICD-10-CM

## 2014-04-23 ENCOUNTER — Ambulatory Visit
Admission: RE | Admit: 2014-04-23 | Discharge: 2014-04-23 | Disposition: A | Discharge status: Home / Self Care | Payer: 59 | Source: Ambulatory Visit

## 2014-04-23 DIAGNOSIS — Z1231 Encounter for screening mammogram for malignant neoplasm of breast: Secondary | ICD-10-CM

## 2014-05-03 ENCOUNTER — Ambulatory Visit: Payer: 59 | Admitting: Podiatrist

## 2014-06-24 ENCOUNTER — Encounter: Payer: Self-pay | Admitting: Nurse Practitioner

## 2014-06-24 ENCOUNTER — Ambulatory Visit (INDEPENDENT_AMBULATORY_CARE_PROVIDER_SITE_OTHER): Payer: 59 | Admitting: Nurse Practitioner

## 2014-06-24 ENCOUNTER — Telehealth: Payer: Self-pay | Admitting: Obstetrics & Gynecology

## 2014-06-24 VITALS — BP 124/86 | HR 104 | Temp 98.0°F | Resp 20 | Ht 63.75 in | Wt 133.0 lb

## 2014-06-24 DIAGNOSIS — R350 Frequency of micturition: Secondary | ICD-10-CM

## 2014-06-24 LAB — POCT URINALYSIS DIPSTICK
Bilirubin, UA: NEGATIVE
GLUCOSE UA: NEGATIVE
Ketones, UA: NEGATIVE
Nitrite, UA: NEGATIVE
PROTEIN UA: NEGATIVE
Urobilinogen, UA: NEGATIVE
pH, UA: 5

## 2014-06-24 MED ORDER — NITROFURANTOIN MONOHYD MACRO 100 MG PO CAPS: 100.0000 mg | ORAL_CAPSULE | Freq: Two times a day (BID) | ORAL | Status: DC

## 2014-06-24 NOTE — Progress Notes (Signed)
S:  43 y.o.Divorced Caucasian female presents with complaint of UTI. Symptoms began 2 weeks ago. With symptoms of dysuria, urinary frequency, urinary urgency, slight lower back pain. Pertinent negatives include The patient is having no constitutional symptoms, denying fever, chills, anorexia, or weight loss.. Sexually active yes  Symptoms are not thought to be related to post coital.  But she has been SA more recently.   Current method of birth control Sterilization by Laparoscopy.   She is not Menopausal.   No vaginal dryness.   Same partner with change since September 2015. Sh is comfortable that he has no STD's but may consider testing at regular AEX.   Last UTI documented years ago.  Denies vaginal discharge.  ROS: feels well  O alert, oriented to person, place, and time, normal mood, behavior, speech, dress, motor activity, and thought processes   thin, healthy,  alert and  not in acute distress  Mild tenderness over the suprapubic area  No CVA tenderness  Pelvic: deferred   Diagnostic Test:    Urinalysis trace RBC, 1+ leuk's   urine culture and micro  Assessment: R/O UTI  Plan:  Maintain adequate hydration. Follow up if symptoms not improving, and as needed.   Medication Therapy: Macrobid 100 mg BID # 14   Lab:  Will follow with lab results   RV

## 2014-06-24 NOTE — Telephone Encounter (Signed)
Spoke with patient. Patient states that she is having burning and pressure with urination. Symptoms started 2 weeks ago. Now having increased frequency with little output. Denies back pain, fevers, or chills. Has been taking AZO and drinking cranberry juice. Advised patient will need to be seen today for evaluation. Patient is agreeable. Appointment scheduled for today at 1:45pm with Milford Cage, [place]. Agreeable to date and time.  Routing to provider for final review. Patient agreeable to disposition. Will close encounter

## 2014-06-24 NOTE — Telephone Encounter (Signed)
Pt thinks she may have a uti

## 2014-06-24 NOTE — Patient Instructions (Signed)

## 2014-06-25 LAB — URINALYSIS, MICROSCOPIC ONLY
BACTERIA UA: NONE SEEN
CASTS: NONE SEEN
CRYSTALS: NONE SEEN
Squamous Epithelial / LPF: NONE SEEN

## 2014-06-25 NOTE — Progress Notes (Signed)
Reviewed personally.  M. Suzanne Rock Sobol, MD.  

## 2014-06-27 ENCOUNTER — Other Ambulatory Visit: Payer: Self-pay | Admitting: Nurse Practitioner

## 2014-06-27 LAB — URINE CULTURE: Colony Count: 50000

## 2014-06-27 MED ORDER — SULFAMETHOXAZOLE-TRIMETHOPRIM 800-160 MG PO TABS: 1.0000 | ORAL_TABLET | Freq: Two times a day (BID) | ORAL | Status: DC

## 2014-09-24 ENCOUNTER — Encounter: Payer: Self-pay | Admitting: Nurse Practitioner

## 2014-09-24 ENCOUNTER — Telehealth: Payer: Self-pay | Admitting: Obstetrics & Gynecology

## 2014-09-24 ENCOUNTER — Ambulatory Visit (INDEPENDENT_AMBULATORY_CARE_PROVIDER_SITE_OTHER): Payer: 59 | Admitting: Nurse Practitioner

## 2014-09-24 VITALS — BP 122/76 | HR 76 | Ht 63.75 in | Wt 128.0 lb

## 2014-09-24 DIAGNOSIS — R8290 Unspecified abnormal findings in urine: Secondary | ICD-10-CM | POA: Diagnosis not present

## 2014-09-24 DIAGNOSIS — A64 Unspecified sexually transmitted disease: Secondary | ICD-10-CM

## 2014-09-24 NOTE — Telephone Encounter (Signed)
Pt has been having vaginal itching for a week.

## 2014-09-24 NOTE — Patient Instructions (Signed)
Will call with test results

## 2014-09-24 NOTE — Telephone Encounter (Signed)
Spoke with patient. Patient states that she has been experiencing itching from her vagina to her rectum for one week. Itching began after intercourse when partner returned from being out of town. Denies any vaginal discharge. Desires STD testing. Appointment scheduled for today at 12:45pm with Milford Cage, [place]. Patient is agreeable to date and time.  Routing to provider for final review. Patient agreeable to disposition. Will close encounter

## 2014-09-24 NOTE — Progress Notes (Signed)
44 y.o.Divorced White female [phone] here with complaint of vaginal symptoms of vulvar itching, burning, and increase discharge. Describes very little discharge but was treating with Monistat thinking she had a yeast infection.  Her boyfriend has been out of the country and returned with a rash over his chest that he relates to being hot while running.  They have also been in the hot tub since his return.    Current boyfriend of several months Onset of symptoms 3-4 days ago. Denies new personal products or vaginal dryness. ? STD concerns. Urinary symptoms none . Contraception is bilateral salpingectomy and Novasure Ablation.  She request STD's.   O:Healthy female WDWN Affect: normal, orientation x 3  Exam: alert and in no distress Abdomen: soft and non tender Lymph node: no enlargement or tenderness Pelvic exam: External genital: normal female with areas that look like LSA.  No other lesions or signs of HSV. BUS: negative Vagina: minimal discharge noted other than presence of Monistat cream. Affirm taken Adnexa:normal, non tender, no masses or fullness noted.  Test done:  Affirm Test GC/Chl STD Panel   A: ? LSA  R/O STD's  No other rashes   P: Discussed findings of vulvar changes and possibility of LSA.  This had already been noted by Dr. Sabra Heck on ine other occasion and she was given Temovate.  Discussed Aveeno or baking soda sitz bath for comfort. Avoid moist clothes or pads for extended period of time. If working out in gym clothes or swim suits for long periods of time change underwear or bottoms of swimsuit if possible. Olive Oil/Coconut Oil use for skin protection prior to activity can be used to external skin.  Rx: none given awaiting test results  She will discuss ? LSA findings with Dr. Sabra Heck again at [facility] in June  RV prn

## 2014-09-25 ENCOUNTER — Other Ambulatory Visit: Payer: Self-pay | Admitting: Certified Nurse Midwife

## 2014-09-25 DIAGNOSIS — N760 Acute vaginitis: Principal | ICD-10-CM

## 2014-09-25 DIAGNOSIS — B9689 Other specified bacterial agents as the cause of diseases classified elsewhere: Secondary | ICD-10-CM

## 2014-09-25 LAB — WET PREP BY MOLECULAR PROBE
Candida species: NEGATIVE
Gardnerella vaginalis: POSITIVE — AB
Trichomonas vaginosis: NEGATIVE

## 2014-09-25 LAB — STD PANEL
HEP B S AG: NEGATIVE
HIV: NONREACTIVE

## 2014-09-25 MED ORDER — CLINDAMYCIN PHOSPHATE 2 % VA CREA: TOPICAL_CREAM | VAGINAL | Status: DC

## 2014-09-26 LAB — URINE CULTURE
COLONY COUNT: NO GROWTH
Organism ID, Bacteria: NO GROWTH

## 2014-09-26 LAB — IPS N GONORRHOEA AND CHLAMYDIA BY PCR

## 2014-09-27 NOTE — Progress Notes (Signed)
Encounter reviewed by Dr. Brook Silva.  

## 2014-11-18 ENCOUNTER — Encounter: Payer: Self-pay | Admitting: Obstetrics & Gynecology

## 2014-11-18 ENCOUNTER — Ambulatory Visit (INDEPENDENT_AMBULATORY_CARE_PROVIDER_SITE_OTHER): Payer: 59 | Admitting: Obstetrics & Gynecology

## 2014-11-18 VITALS — BP 100/62 | HR 64 | Resp 16 | Ht 63.5 in | Wt 127.0 lb

## 2014-11-18 DIAGNOSIS — Z01419 Encounter for gynecological examination (general) (routine) without abnormal findings: Secondary | ICD-10-CM

## 2014-11-18 MED ORDER — CLOBETASOL PROPIONATE 0.05 % EX OINT: 1.0000 "application " | TOPICAL_OINTMENT | Freq: Two times a day (BID) | CUTANEOUS | Status: DC

## 2014-11-18 MED ORDER — ESCITALOPRAM OXALATE 5 MG PO TABS: 5.0000 mg | ORAL_TABLET | Freq: Two times a day (BID) | ORAL | Status: DC

## 2014-11-18 MED ORDER — ONDANSETRON 4 MG PO TBDP: 4.0000 mg | ORAL_TABLET | Freq: Three times a day (TID) | ORAL | Status: DC | PRN

## 2014-11-18 MED ORDER — ALPRAZOLAM 0.5 MG PO TABS: 0.5000 mg | ORAL_TABLET | Freq: Every day | ORAL | Status: DC | PRN
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
## 2015-04-15 ENCOUNTER — Other Ambulatory Visit: Payer: Self-pay

## 2015-04-15 DIAGNOSIS — Z1231 Encounter for screening mammogram for malignant neoplasm of breast: Secondary | ICD-10-CM

## 2015-05-22 ENCOUNTER — Ambulatory Visit
Admission: RE | Admit: 2015-05-22 | Discharge: 2015-05-22 | Disposition: A | Discharge status: Home / Self Care | Payer: Commercial Managed Care - HMO | Source: Ambulatory Visit

## 2015-05-22 DIAGNOSIS — Z1231 Encounter for screening mammogram for malignant neoplasm of breast: Secondary | ICD-10-CM

## 2015-05-27 ENCOUNTER — Other Ambulatory Visit: Payer: Self-pay | Admitting: Obstetrics & Gynecology

## 2015-05-27 DIAGNOSIS — R928 Other abnormal and inconclusive findings on diagnostic imaging of breast: Secondary | ICD-10-CM

## 2015-06-04 ENCOUNTER — Ambulatory Visit
Admission: RE | Admit: 2015-06-04 | Discharge: 2015-06-04 | Disposition: A | Discharge status: Home / Self Care | Payer: Commercial Managed Care - HMO | Source: Ambulatory Visit | Attending: Obstetrics & Gynecology | Admitting: Obstetrics & Gynecology

## 2015-06-04 DIAGNOSIS — R928 Other abnormal and inconclusive findings on diagnostic imaging of breast: Secondary | ICD-10-CM

## 2015-06-04 IMAGING — MG MM DIAG BREAST TOMO UNI LEFT
4 series · 4 of 12 positions shown · non-contrast
Comparison: Previous exam(s).

CLINICAL DATA: Patient was called back from screening mammogram for
possible masses in the left breast.

EXAM:
DIGITAL DIAGNOSTIC LEFT MAMMOGRAM WITH 3D TOMOSYNTHESIS
ULTRASOUND LEFT BREAST

[L MLO]
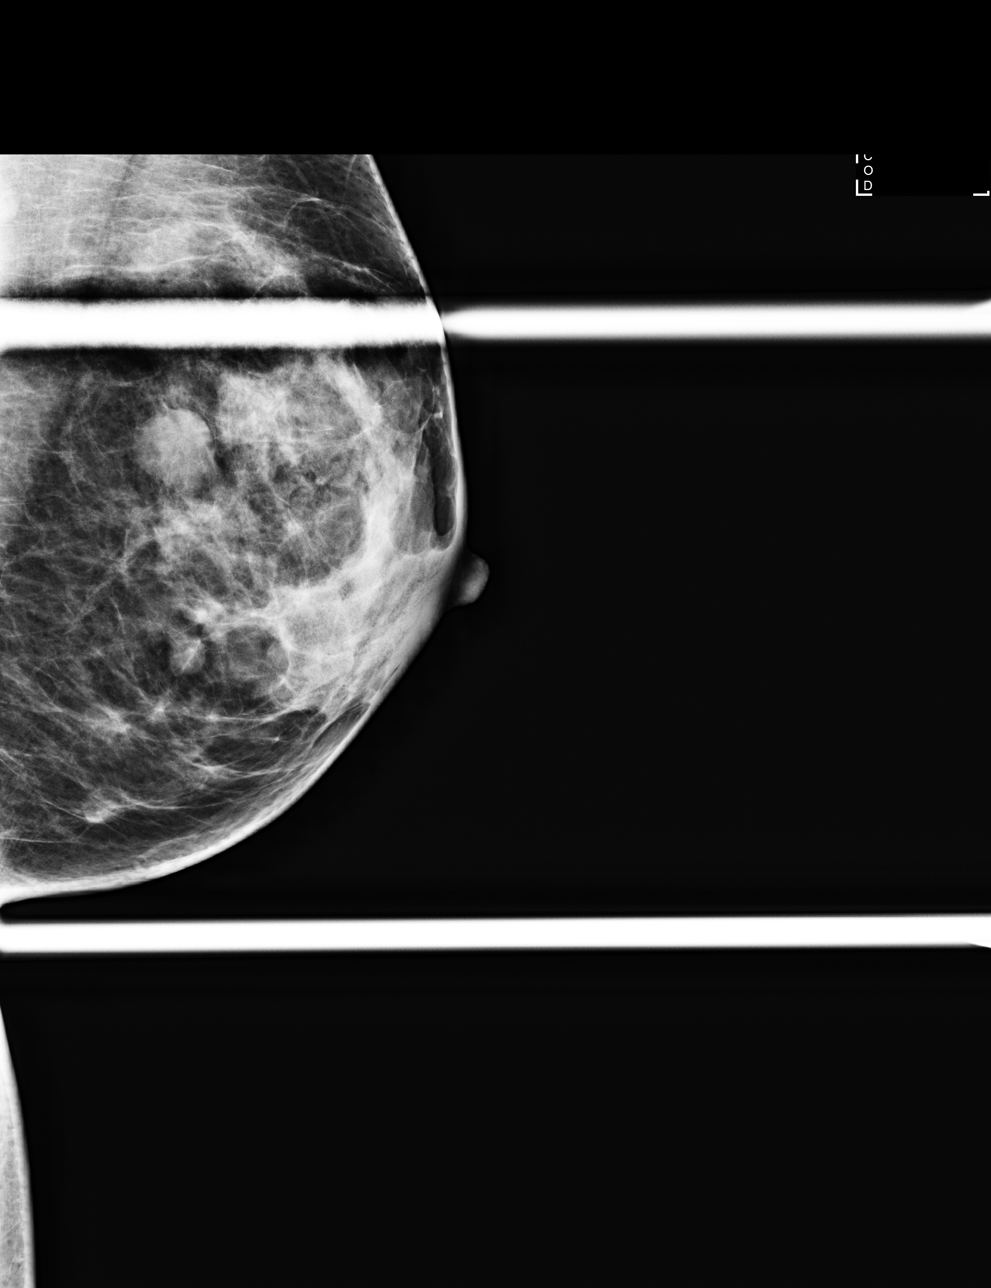

[L CC]
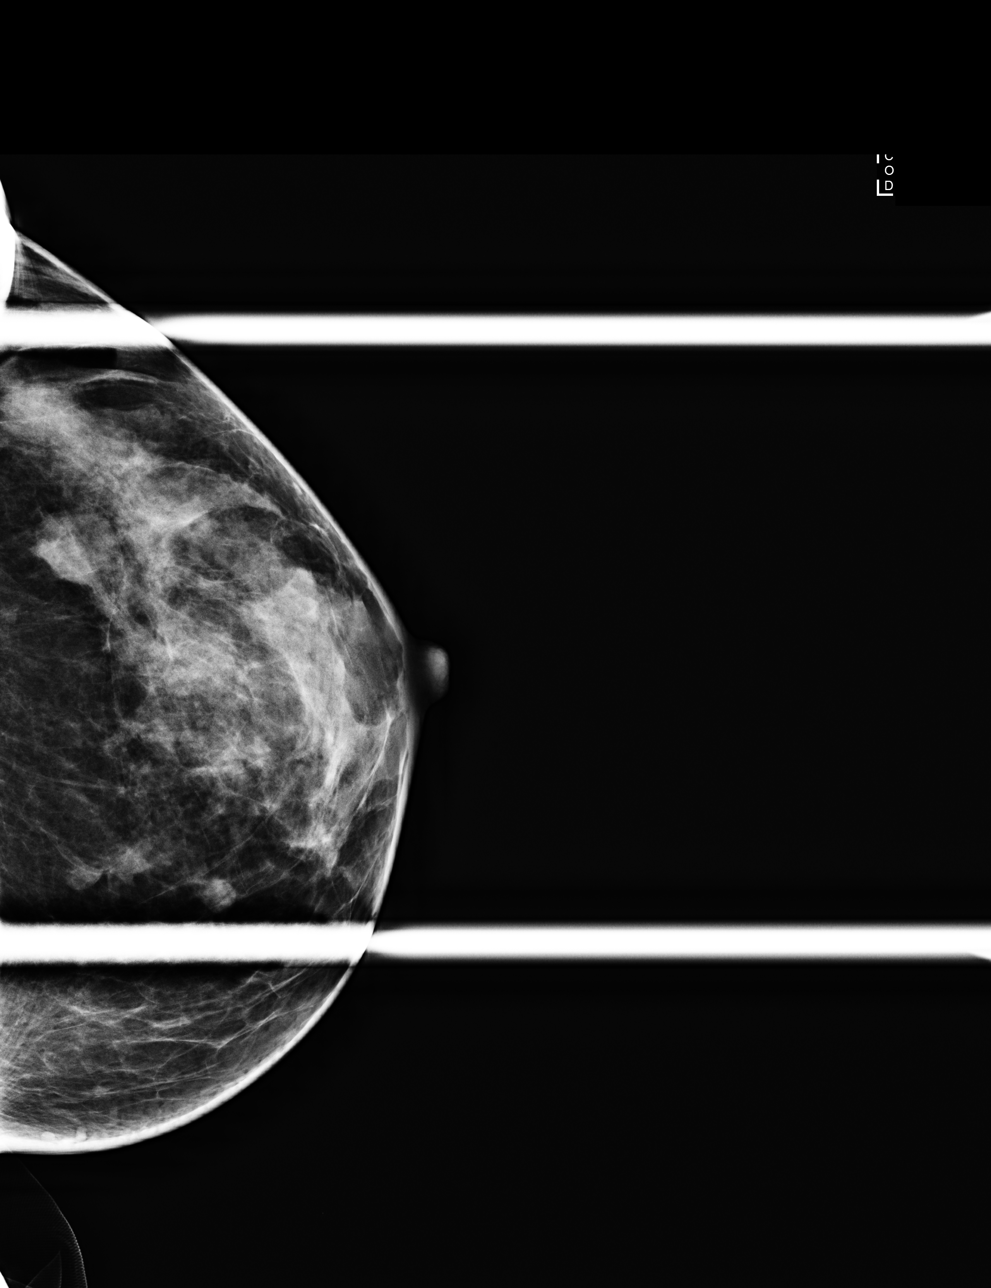

[L MLO tomo · tomo slice 26/51.0]
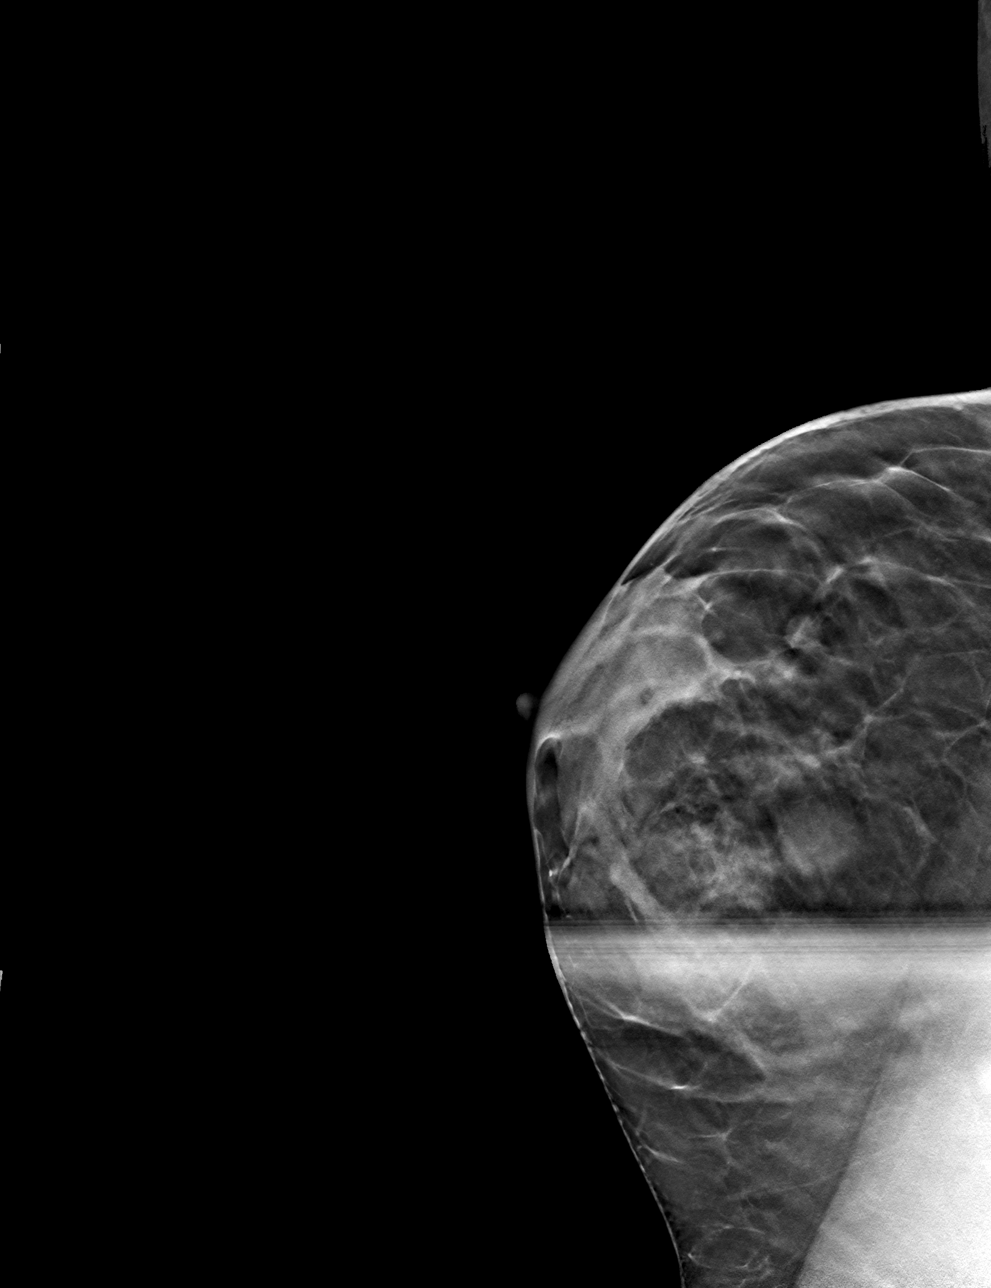

[L CC tomo · tomo slice 27/52.0]
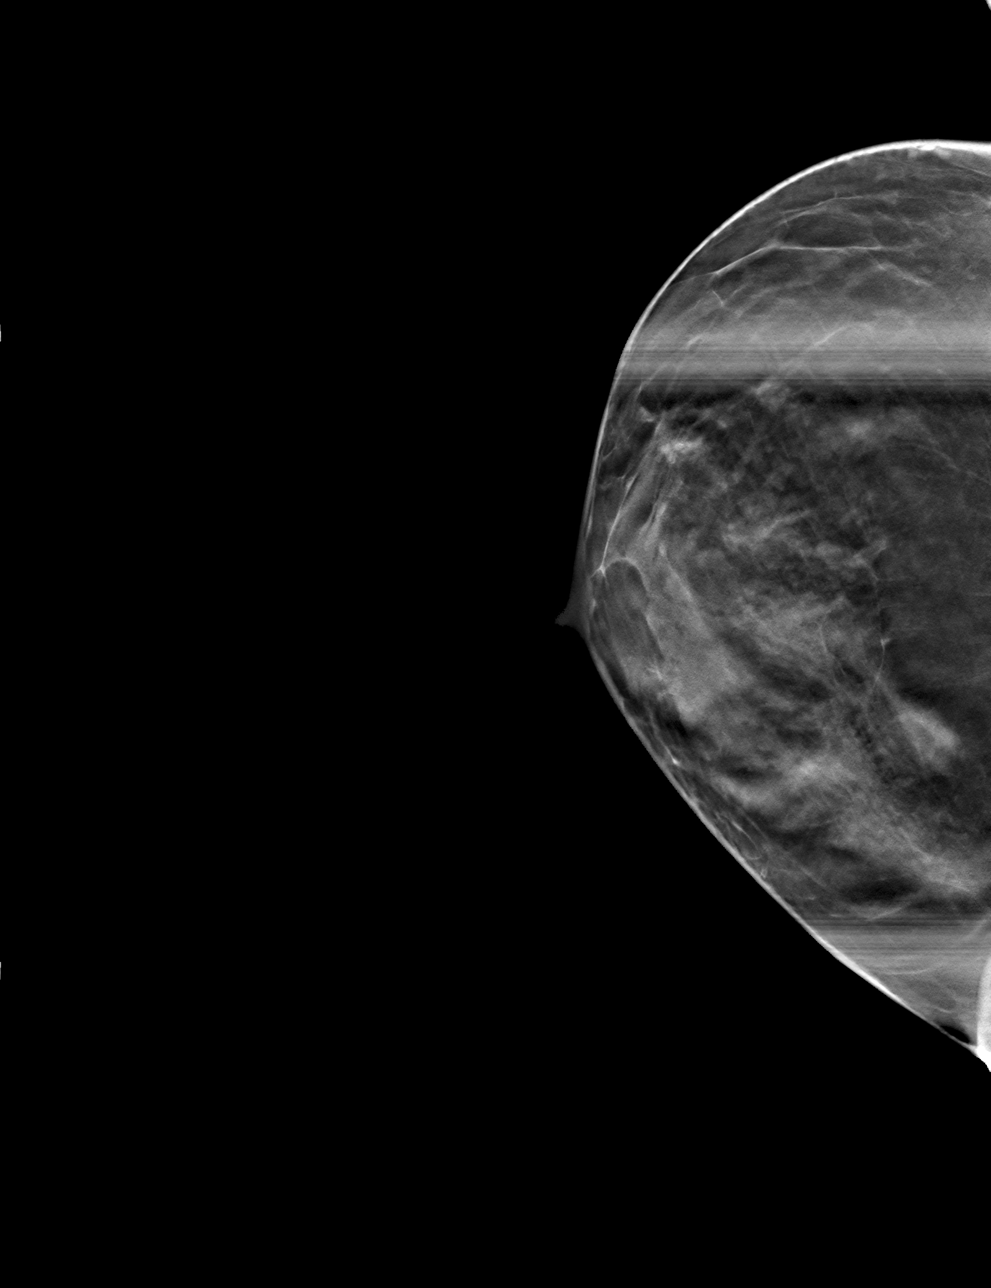

[4 of 12 positions shown; findings below may reference images not displayed]

ACR Breast Density Category c: The breast tissue is heterogeneously
dense, which may obscure small masses.
FINDINGS: Additional imaging of the left breast was performed. There is
persistence of a well-circumscribed 1.3 cm mass in the posterior
third of the upper-outer quadrant of the left breast and a 5 mm mass
in the posterior third of the lower inner quadrant of the left
breast. There are no malignant type microcalcifications.

On physical exam, I do not palpate a discrete mass in the left
breast.

Targeted ultrasound is performed, showing a well-circumscribed
anechoic cyst in the left breast at 8 o'clock in the subareolar
location measuring 5 x 6 x 4 mm. There is a well-circumscribed cyst
in the left breast at 3 o'clock 3 cm from the nipple measuring 1.2 x
0.8 x 1.4 cm. There is a well-circumscribed cyst in the left breast
at 2 o'clock 4 cm from the nipple measured 1.2 x 1.2 x 0.5 cm. No
solid mass or abnormal shadowing detected.
IMPRESSION: Left breast cysts.  No evidence of malignancy.

RECOMMENDATION:
Bilateral screening mammogram in 1 year is recommended.

I have discussed the findings and recommendations with the patient.
Results were also provided in writing at the conclusion of the
visit. If applicable, a reminder letter will be sent to the patient
regarding the next appointment.

BI-RADS CATEGORY  2: Benign.

## 2015-06-04 IMAGING — US US BREAST LTD UNI LEFT INC AXILLA
1 series · 13 of 15 positions shown · non-contrast
Comparison: Previous exam(s).

CLINICAL DATA: Patient was called back from screening mammogram for
possible masses in the left breast.

EXAM:
DIGITAL DIAGNOSTIC LEFT MAMMOGRAM WITH 3D TOMOSYNTHESIS
ULTRASOUND LEFT BREAST

[Series 1: advbreast · 13 of 15 slices shown]
[im 1/15]
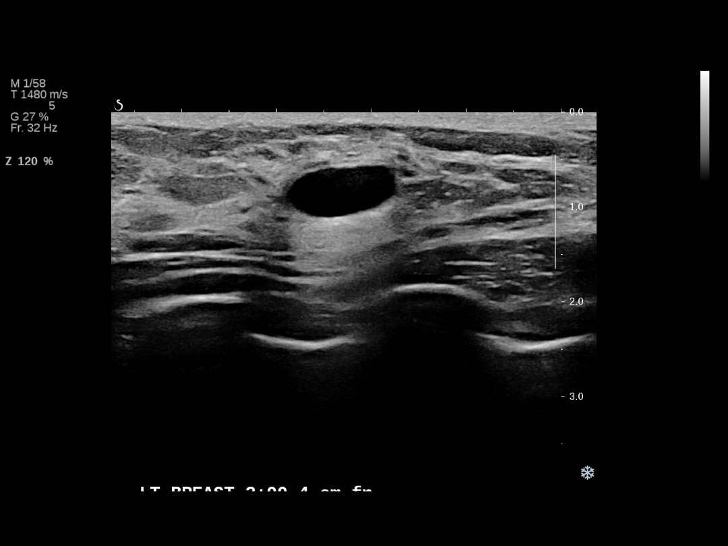
[im 2/15]
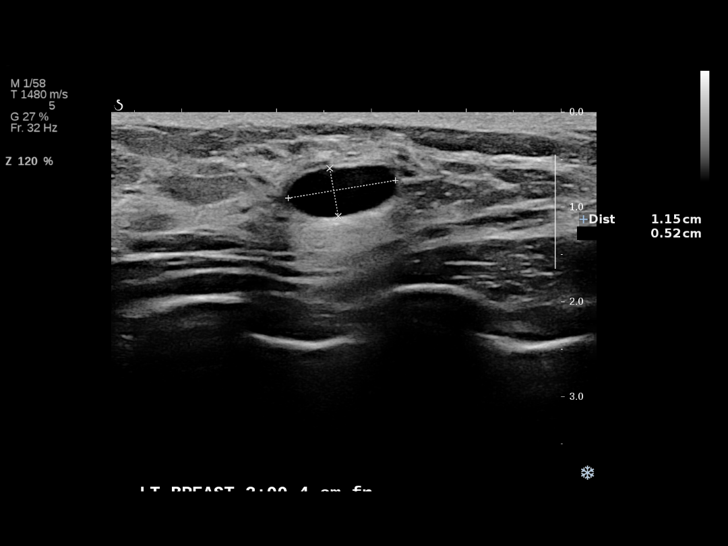
[im 3/15]
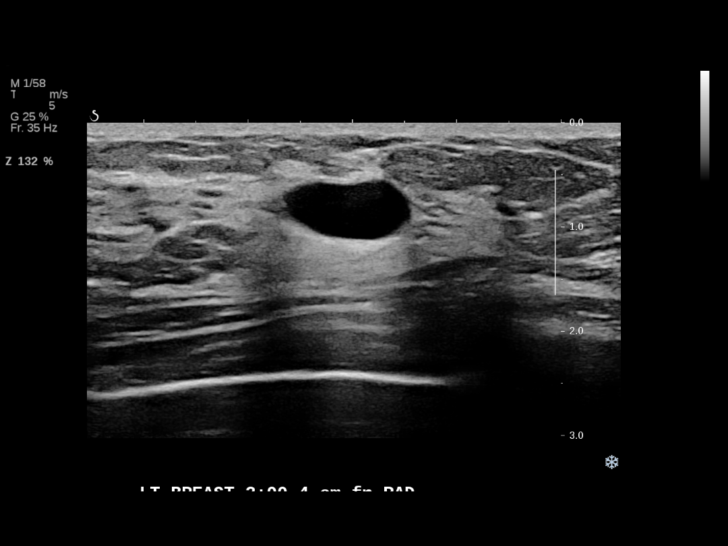
[im 5/15]
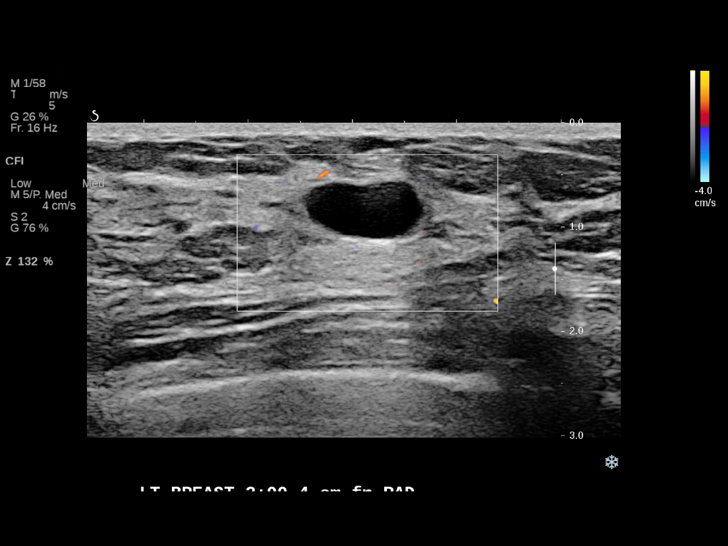
[im 6/15]
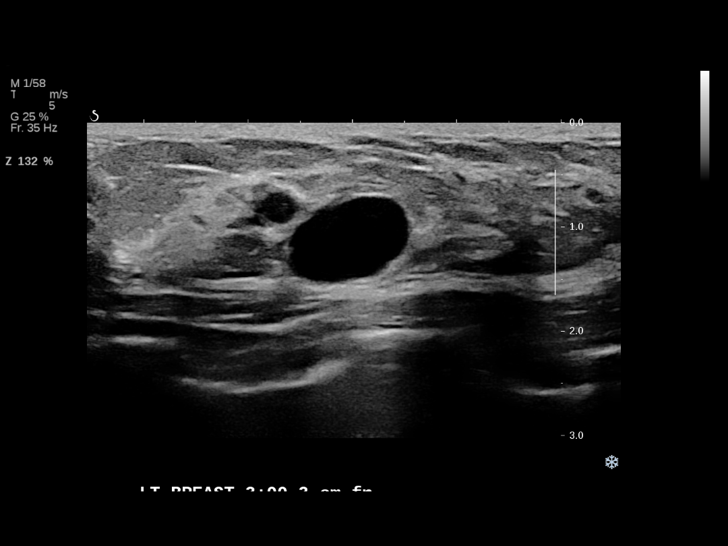
[im 7/15]
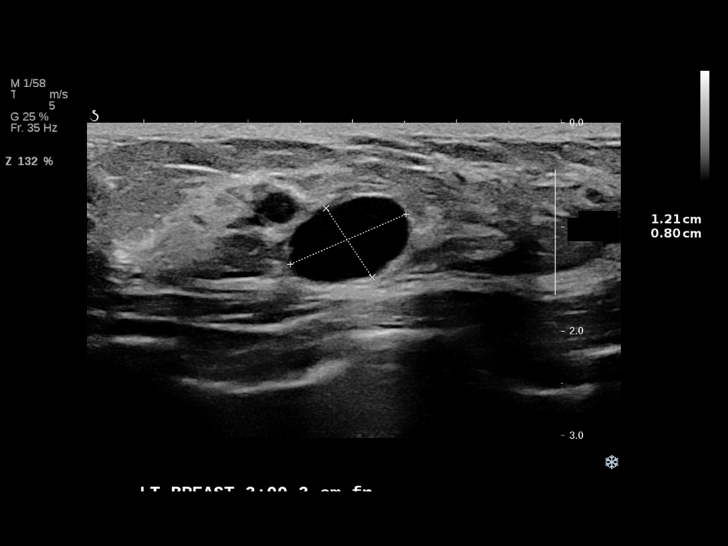
[im 8/15]
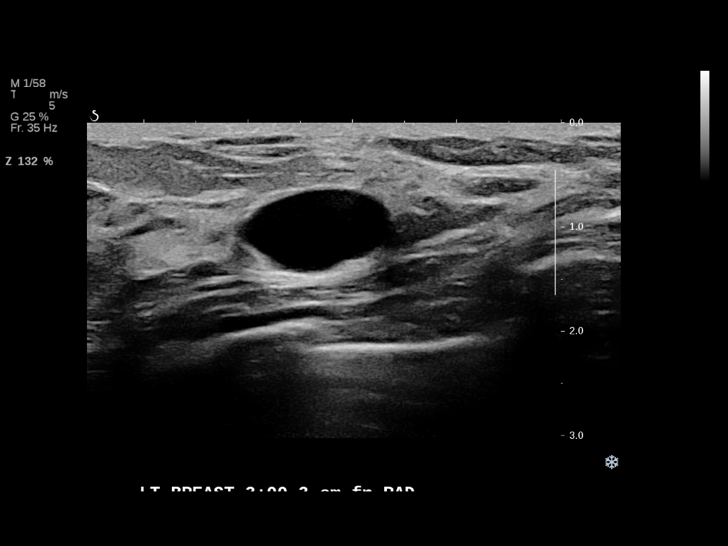
[im 9/15]
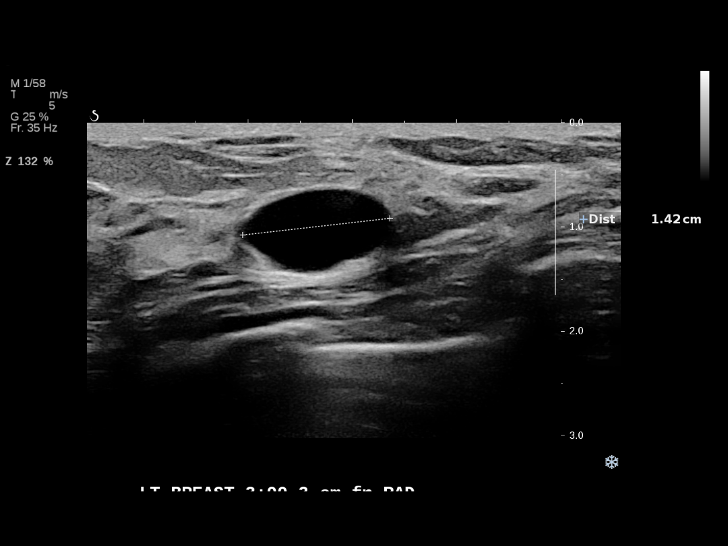
[im 10/15]
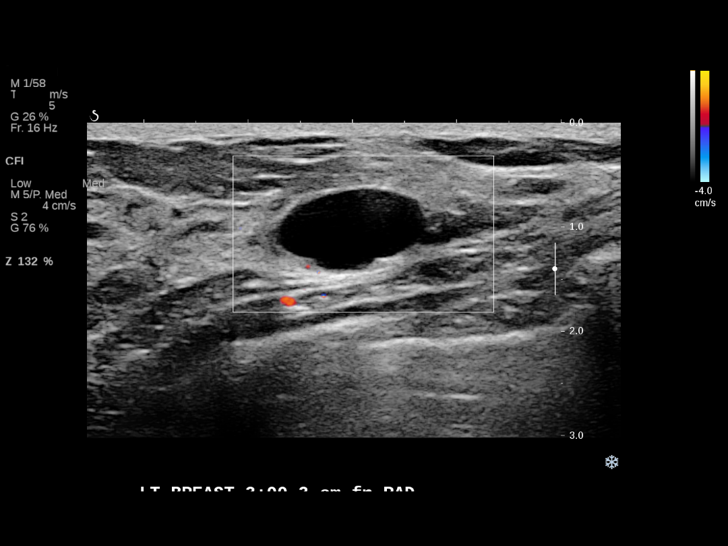
[im 11/15]
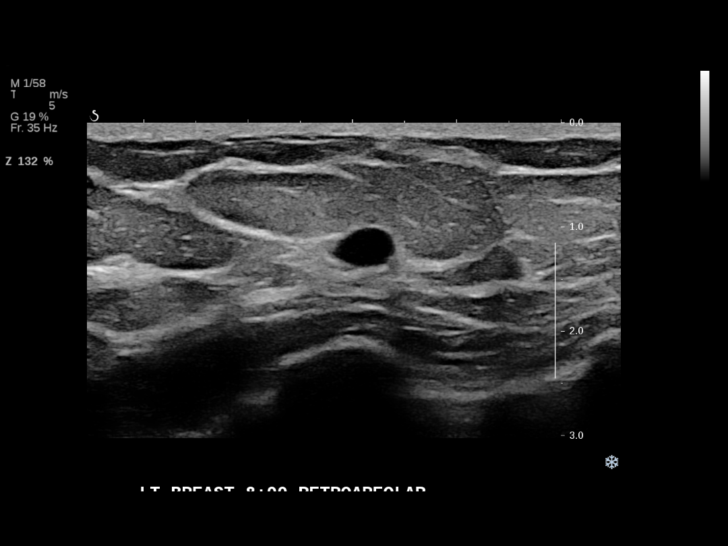
[im 13/15]
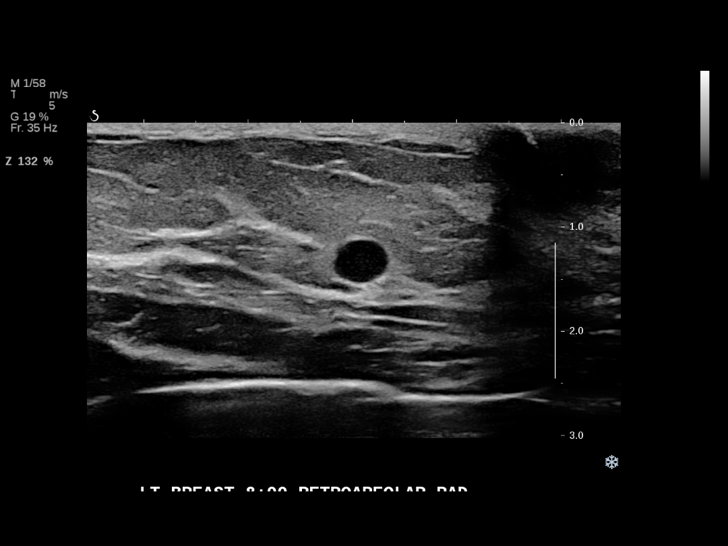
[im 14/15]
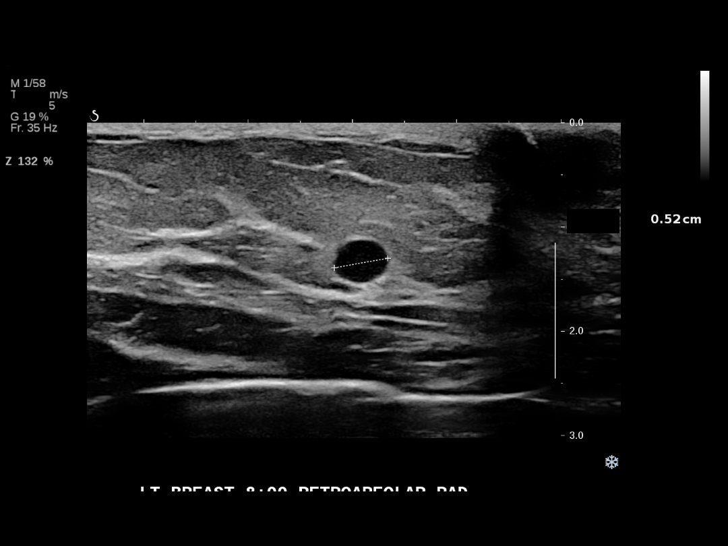
[im 15/15]
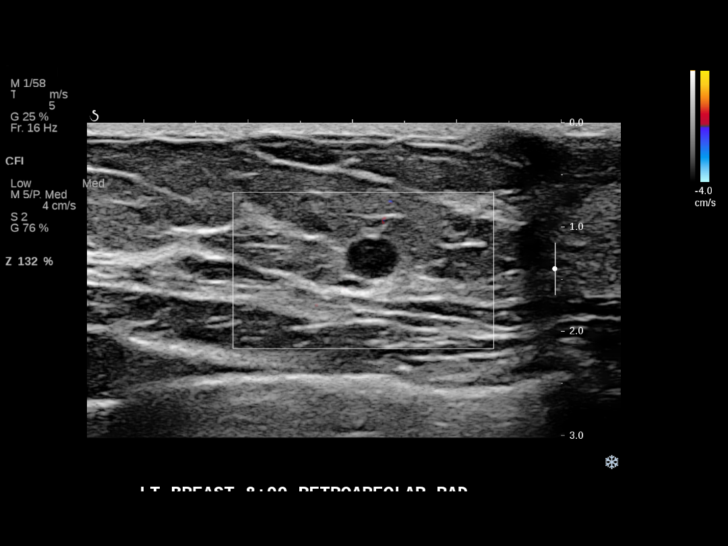

[13 of 15 positions shown; findings below may reference images not displayed]

ACR Breast Density Category c: The breast tissue is heterogeneously
dense, which may obscure small masses.
FINDINGS: Additional imaging of the left breast was performed. There is
persistence of a well-circumscribed 1.3 cm mass in the posterior
third of the upper-outer quadrant of the left breast and a 5 mm mass
in the posterior third of the lower inner quadrant of the left
breast. There are no malignant type microcalcifications.

On physical exam, I do not palpate a discrete mass in the left
breast.

Targeted ultrasound is performed, showing a well-circumscribed
anechoic cyst in the left breast at 8 o'clock in the subareolar
location measuring 5 x 6 x 4 mm. There is a well-circumscribed cyst
in the left breast at 3 o'clock 3 cm from the nipple measuring 1.2 x
0.8 x 1.4 cm. There is a well-circumscribed cyst in the left breast
at 2 o'clock 4 cm from the nipple measured 1.2 x 1.2 x 0.5 cm. No
solid mass or abnormal shadowing detected.
IMPRESSION: Left breast cysts.  No evidence of malignancy.

RECOMMENDATION:
Bilateral screening mammogram in 1 year is recommended.

I have discussed the findings and recommendations with the patient.
Results were also provided in writing at the conclusion of the
visit. If applicable, a reminder letter will be sent to the patient
regarding the next appointment.

BI-RADS CATEGORY  2: Benign.

## 2015-12-13 ENCOUNTER — Other Ambulatory Visit: Payer: Self-pay | Admitting: Obstetrics & Gynecology

## 2015-12-15 NOTE — Telephone Encounter (Signed)
Medication refill request: Escitalopram 5mg  Last AEX:  11/18/14 SM Next AEX: 02/17/16 Last MMG (if hormonal medication request): 06/04/15 BIRADS2 benign Refill authorized: 11/18/14 #60 w/12 refills; today #60 w/0 refills?

## 2016-02-17 ENCOUNTER — Ambulatory Visit (INDEPENDENT_AMBULATORY_CARE_PROVIDER_SITE_OTHER): Payer: Commercial Managed Care - HMO | Admitting: Obstetrics & Gynecology

## 2016-02-17 ENCOUNTER — Encounter: Payer: Self-pay | Admitting: Obstetrics & Gynecology

## 2016-02-17 VITALS — BP 100/68 | HR 60 | Resp 12 | Ht 63.5 in | Wt 132.0 lb

## 2016-02-17 DIAGNOSIS — Z01419 Encounter for gynecological examination (general) (routine) without abnormal findings: Secondary | ICD-10-CM

## 2016-02-17 DIAGNOSIS — Z23 Encounter for immunization: Secondary | ICD-10-CM

## 2016-02-17 DIAGNOSIS — B977 Papillomavirus as the cause of diseases classified elsewhere: Secondary | ICD-10-CM

## 2016-02-17 DIAGNOSIS — Z124 Encounter for screening for malignant neoplasm of cervix: Secondary | ICD-10-CM | POA: Diagnosis not present

## 2016-02-17 MED ORDER — ESCITALOPRAM OXALATE 5 MG PO TABS: ORAL_TABLET | ORAL | 4 refills | Status: DC

## 2016-02-17 MED ORDER — ALPRAZOLAM 0.5 MG PO TABS: 0.5000 mg | ORAL_TABLET | Freq: Every day | ORAL | 1 refills | Status: DC | PRN

## 2016-02-17 MED ORDER — CLOBETASOL PROPIONATE 0.05 % EX OINT: 1.0000 "application " | TOPICAL_OINTMENT | Freq: Two times a day (BID) | CUTANEOUS | 2 refills | Status: DC

## 2016-02-17 NOTE — Progress Notes (Signed)
45 y.o. [id number] DivorcedCaucasianF here for annual exam.  Doing well.  Dating same person for two years.  Denies vaginal bleeding.    No LMP recorded. Patient has had an ablation.          Sexually active: Yes.    The current method of family planning is bilateral salpingectomy.    Exercising: Yes.    walking, run, weight training Smoker:  no  Health Maintenance: Pap:  10/30/2013 negative  History of abnormal Pap:  no MMG:  06/04/2015 BIRADS 2 benign, U/S same day left breast cysts  Colonoscopy:  01/01/2013 polyps repeat 5 years  BMD:   never TDaP:  Unsure, would like to get one today  Pneumonia vaccine(s):  never Zostavax:   never Hep C testing: not indicated  Screening Labs: plan lab work next year   reports that she quit smoking about 3 years ago. She has never used smokeless tobacco. She reports that she drinks about 3.0 oz of alcohol per week . She reports that she does not use drugs.  Past Medical History:  Diagnosis Date  . Anxiety   . Depression   . GERD (gastroesophageal reflux disease)   . IBS (irritable bowel syndrome)     Past Surgical History:  Procedure Laterality Date  . BILATERAL SALPINGECTOMY Bilateral 10/31/2012   Procedure: BILATERAL SALPINGECTOMY;  Surgeon: Lyman Speller, MD;  Location: [facility];  Service: Gynecology;  Laterality: Bilateral;  . CESAREAN SECTION    . CYSTO WITH HYDRODISTENSION N/A 10/31/2012   Procedure: CYSTOSCOPY/HYDRODISTENSION;  Surgeon: Reece Packer, MD;  Location: [facility];  Service: Urology;  Laterality: N/A;  . DILATION AND CURETTAGE OF UTERUS    . DILITATION & CURRETTAGE/HYSTROSCOPY WITH NOVASURE ABLATION N/A 10/31/2012   Procedure: DILATATION & CURETTAGE/HYSTEROSCOPY WITH NOVASURE ABLATION;  Surgeon: Lyman Speller, MD;  Location: [facility];  Service: Gynecology;  Laterality: N/A;  Dr Suzanne Boron Diarmid to start case with a cysto at beginning of case while patient under anesthesia.  . ENDOMETRIAL BIOPSY  09/19/12   neg  . LAPAROSCOPY  Bilateral 10/31/2012   Procedure: LAPAROSCOPY OPERATIVE;  Surgeon: Lyman Speller, MD;  Location: [facility];  Service: Gynecology;  Laterality: Bilateral;    Current Outpatient Prescriptions  Medication Sig Dispense Refill  . ALPRAZolam (XANAX) 0.5 MG tablet Take 1 tablet (0.5 mg total) by mouth daily as needed. for anxiety 30 tablet 1  . clobetasol ointment (TEMOVATE) [id number] % Apply 1 application topically 2 (two) times daily. Apply as directed twice daily for up to 14 days. 30 g 1  . escitalopram (LEXAPRO) 5 MG tablet TAKE 1 TABLET(5 MG) BY MOUTH TWICE DAILY 60 tablet 3  . ondansetron (ZOFRAN ODT) 4 MG disintegrating tablet Take 1 tablet (4 mg total) by mouth every 8 (eight) hours as needed for nausea or vomiting. 20 tablet 1   No current facility-administered medications for this visit.     Family History  Problem Relation Age of Onset  . Stroke Father   . Non-Hodgkin's lymphoma Father   . Hypertension Father   . Kidney disease Father   . Club foot Son   . Hypertension Mother     ROS:  Pertinent items are noted in HPI.  Otherwise, a comprehensive ROS was negative.  Exam:   Vitals:   02/17/16 1255  BP: 100/68  Pulse: 60  Resp: 12    General appearance: alert, cooperative and appears stated age Head: Normocephalic, without obvious abnormality, atraumatic Neck: no adenopathy, supple, symmetrical, trachea  midline and thyroid normal to inspection and palpation Lungs: clear to auscultation bilaterally Breasts: normal appearance, no masses or tenderness Heart: regular rate and rhythm Abdomen: soft, non-tender; bowel sounds normal; no masses,  no organomegaly Extremities: extremities normal, atraumatic, no cyanosis or edema Skin: Skin color, texture, turgor normal. No rashes or lesions Lymph nodes: Cervical, supraclavicular, and axillary nodes normal. No abnormal inguinal nodes palpated Neurologic: Grossly normal  Pelvic: External genitalia:  no lesions              Urethra:   normal appearing urethra with no masses, tenderness or lesions              Bartholins and Skenes: normal                 Vagina: normal appearing vagina with normal color and discharge, no lesions              Cervix: no lesions              Pap taken: Yes.   Bimanual Exam:  Uterus:  normal size, contour, position, consistency, mobility, non-tender              Adnexa: normal adnexa and no mass, fullness, tenderness               Rectovaginal: Confirms               Anus:  normal sphincter tone, no lesions  Chaperone was present for exam.  A:     Well Woman with normal exam Anxiety IBS H/O endometrial ablation and bilateral salpingectomy Had neg STD testing 4/16 LSA of vulva  P: Mammogram yearly. H/O recurrent breast cysts Xanax 0.5mg  1/2 to 1 tab prn #30/1RF Lexapro 5mg  BID #60/12RF.  Pt alternates between taking daily and BID, depending on what is going on with personal life. pap smear 2015. H/o neg HR HPV 2013.  Pap and HR HPV obtained today. Temovate 0.05% ointment up to BID x up to 7 days.  #60gm/2RF Zofran 4mg  prn nausea with travel #30/0 RF. tdap update today return annually or prn

## 2016-02-20 LAB — IPS PAP TEST WITH HPV

## 2016-02-20 NOTE — Addendum Note (Signed)
Addended by: Megan Salon on: 02/20/2016 05:27 PM   Modules accepted: Orders

## 2016-02-25 LAB — IPS HPV GENOTYPING 16/18

## 2016-02-27 ENCOUNTER — Telehealth: Payer: Self-pay

## 2016-02-27 MED ORDER — ONDANSETRON HCL 4 MG PO TABS: 4.0000 mg | ORAL_TABLET | Freq: Three times a day (TID) | ORAL | 0 refills | Status: DC | PRN

## 2016-02-27 NOTE — Telephone Encounter (Signed)
-----   Message from Megan Salon, MD sent at 02/27/2016  9:06 AM EDT ----- Please let pt know her pap was normal but HR HPV was positive.  Her 16/[birth year] was negative so needs Pap and HR HPV 1 year.  08 recall.  If has lots of questions, have her return for OV.

## 2016-02-27 NOTE — Telephone Encounter (Signed)
Spoke with patient. Advised of results and message as seen below from [place]. Patient is agreeable and verbalizes understanding. 08 recall placed. Patient states at her AEX on 02/17/2016 a rx for Zofran was going to be called into her pharmacy, but the pharmacy does not have this on file. Per review of OV note Zofran 4 mg prn for nausea with travel #30 0RF to be sent in. RX sent to the pharmacy at this time. Patient is aware.  Routing to provider for final review. Patient agreeable to disposition. Will close encounter.

## 2016-05-02 ENCOUNTER — Other Ambulatory Visit: Payer: Self-pay | Admitting: Obstetrics & Gynecology

## 2016-07-01 ENCOUNTER — Emergency Department (HOSPITAL_COMMUNITY)
Admission: EM | Admit: 2016-07-01 | Discharge: 2016-07-01 | Disposition: A | Discharge status: Home / Self Care | Payer: Commercial Managed Care - HMO | Attending: Emergency Medicine | Admitting: Emergency Medicine

## 2016-07-01 ENCOUNTER — Emergency Department (HOSPITAL_COMMUNITY): Payer: Commercial Managed Care - HMO

## 2016-07-01 ENCOUNTER — Encounter (HOSPITAL_COMMUNITY): Payer: Self-pay

## 2016-07-01 DIAGNOSIS — Z79899 Other long term (current) drug therapy: Secondary | ICD-10-CM | POA: Diagnosis not present

## 2016-07-01 DIAGNOSIS — Z87891 Personal history of nicotine dependence: Secondary | ICD-10-CM | POA: Insufficient documentation

## 2016-07-01 DIAGNOSIS — R079 Chest pain, unspecified: Secondary | ICD-10-CM | POA: Insufficient documentation

## 2016-07-01 LAB — I-STAT TROPONIN, ED
TROPONIN I, POC: 0 ng/mL (ref 0.00–0.08)
Troponin i, poc: 0 ng/mL (ref 0.00–0.08)

## 2016-07-01 LAB — BASIC METABOLIC PANEL
ANION GAP: 9 (ref 5–15)
BUN: 12 mg/dL (ref 6–20)
CHLORIDE: 108 mmol/L (ref 101–111)
CO2: 21 mmol/L — AB (ref 22–32)
Calcium: 9.3 mg/dL (ref 8.9–10.3)
Creatinine, Ser: 0.82 mg/dL (ref 0.44–1.00)
GFR calc non Af Amer: >60 mL/min (ref >60)
Glucose, Bld: 132 mg/dL — ABNORMAL HIGH (ref 65–99)
POTASSIUM: 4.3 mmol/L (ref 3.5–5.1)
Sodium: 138 mmol/L (ref 135–145)

## 2016-07-01 LAB — CBC
HEMATOCRIT: 38.8 % (ref 36.0–46.0)
HEMOGLOBIN: 13.3 g/dL (ref 12.0–15.0)
MCH: 32 pg (ref 26.0–34.0)
MCHC: 34.3 g/dL (ref 30.0–36.0)
MCV: 93.5 fL (ref 78.0–100.0)
Platelets: 258 10*3/uL (ref 150–400)
RBC: 4.15 MIL/uL (ref 3.87–5.11)
RDW: 12.2 % (ref 11.5–15.5)
WBC: 6.3 10*3/uL (ref 4.0–10.5)

## 2016-07-01 MED ORDER — LORAZEPAM 1 MG PO TABS
1.0000 mg | ORAL_TABLET | Freq: Once | ORAL | Status: AC
  Administered 2016-07-01: 1 mg via ORAL
  Filled 2016-07-01: qty 1

## 2016-07-01 NOTE — ED Notes (Signed)
Declined W/C at D/C and was escorted to lobby by RN. 

## 2016-07-01 NOTE — ED Provider Notes (Signed)
[facility] DEPT Provider Note   CSN: [id number] Arrival date & time: 07/01/16  1043     History   Chief Complaint Chief Complaint  Patient presents with  . Chest Pain    HPI   Blood pressure 133/99, pulse 101, temperature 98.2 F (36.8 C), temperature source Oral, resp. rate 16, height _0  (1.626 m), weight 59 kg, SpO2 100 %.  [name] [name] is a 46 y.o. female  with past medical history significant for IBS and anxiety complaining of chest pain intermittently radiating to the jaw and the left shoulder onset several months ago associated with feeling lightheaded diaphoretic and nauseous. She's been having these episodes increasingly over the last several, a typically wake her from sleep at around 6 AM. She denies history of DVT/PE, recent mobilizations, calf pain, leg swelling. She denies history of diabetes, hypertension, hyperlipidemia she is a former smoker with a 15-pack-year history and she quit 4 years ago but she continues to use vaporized nicotine. 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 She had a full cardiac evaluation including a nuclear stress test and Holter monitoring in 2013 by [facility]. She is not really followed with them since. She took low-dose aspirin this morning. She states her pain is improved to 4 out of 10 initially it was severe, 10 out of 10.   Past Medical History:  Diagnosis Date  . Anxiety   . Depression   . GERD (gastroesophageal reflux disease)   . IBS (irritable bowel syndrome)     Patient Active Problem List   Diagnosis Date Noted  . Seizures ([place]) 05/18/2013  . Seizure ([place]) 05/17/2013  . Abdominal  pain- lower 01/03/2013  . Nausea alone 01/03/2013  . Slow transit constipation 08/12/2011  . Rash and nonspecific skin eruption 08/12/2011  . Seasonal allergic rhinitis 08/12/2011  . Neck pain  05/21/2011  . General medical examination 02/19/2011  . Tobacco use disorder 01/10/2011  . HEMORRHOIDS 05/07/2010  . DEPRESSION 04/17/2010  . HEMATURIA UNSPECIFIED 04/17/2010  . EARLY SATIETY 04/17/2010  . GAS/BLOATING 04/17/2010    Past Surgical History:  Procedure Laterality Date  . BILATERAL SALPINGECTOMY Bilateral 10/31/2012   Procedure: BILATERAL SALPINGECTOMY;  Surgeon: Lyman Speller, MD;  Location: [facility];  Service: Gynecology;  Laterality: Bilateral;  . CESAREAN SECTION    . CYSTO WITH HYDRODISTENSION N/A 10/31/2012   Procedure: CYSTOSCOPY/HYDRODISTENSION;  Surgeon: Reece Packer, MD;  Location: [facility];  Service: Urology;  Laterality: N/A;  . DILATION AND CURETTAGE OF UTERUS    . DILITATION & CURRETTAGE/HYSTROSCOPY WITH NOVASURE ABLATION N/A 10/31/2012   Procedure: DILATATION & CURETTAGE/HYSTEROSCOPY WITH NOVASURE ABLATION;  Surgeon: Lyman Speller, MD;  Location: [facility];  Service: Gynecology;  Laterality: N/A;  Dr Suzanne Boron Diarmid to start case with a cysto at beginning of case while patient under anesthesia.  . ENDOMETRIAL BIOPSY  09/19/12   neg  . LAPAROSCOPY Bilateral 10/31/2012   Procedure: LAPAROSCOPY OPERATIVE;  Surgeon: Lyman Speller, MD;  Location: [facility];  Service: Gynecology;  Laterality: Bilateral;    OB History    Gravida Para Term Preterm AB Living   _1 SAB TAB Ectopic Multiple Live Births   1 3             Home Medications  Prior to Admission medications   Medication Sig Start Date End Date Taking? Authorizing Provider  acetaminophen (TYLENOL) 325 MG tablet Take 650 mg by mouth every 6 (six) hours as needed for mild pain.   Yes Historical Provider, MD  ALPRAZolam Duanne Moron) 0.5 MG tablet Take 1 tablet (0.5 mg total) by mouth daily as needed. for anxiety 02/17/16  Yes Megan Salon, MD  escitalopram (LEXAPRO) 5 MG tablet TAKE 1 TABLET(5 MG) BY MOUTH TWICE DAILY Patient taking differently: Take 5 mg by mouth daily.  02/17/16  Yes Megan Salon, MD  ibuprofen (ADVIL,MOTRIN) 200 MG tablet Take 200 mg by mouth every 6 (six) hours as needed for moderate pain.   Yes Historical Provider, MD  naproxen sodium (ANAPROX) 220 MG tablet Take 220 mg by mouth 2 (two) times daily as needed (pain).   Yes Historical Provider, MD  ondansetron (ZOFRAN) 4 MG tablet Take 1 tablet (4 mg total) by mouth every 8 (eight) hours as needed for nausea or vomiting. 02/27/16  Yes Megan Salon, MD  ranitidine (ZANTAC) 150 MG capsule Take 150 mg by mouth every evening.   Yes Historical Provider, MD  clobetasol ointment (TEMOVATE) 3.14 % Apply 1 application topically 2 (two) times daily. Apply as directed twice daily for up to 7 days. Patient not taking: Reported on 07/01/2016 02/17/16   Megan Salon, MD  ondansetron (ZOFRAN ODT) 4 MG disintegrating tablet Take 1 tablet (4 mg total) by mouth every 8 (eight) hours as needed for nausea or vomiting. Patient not taking: Reported on 07/01/2016 11/18/14   Megan Salon, MD    Family History Family History  Problem Relation Age of Onset  . Stroke Father   . Non-Hodgkin's lymphoma Father   . Hypertension Father   . Kidney disease Father   . Hypertension Mother   . Club foot Son     Social History Social History  Substance Use Topics  . Smoking status: Former Smoker    Quit date: 01/15/2013  . Smokeless tobacco: Never Used  . Alcohol use 3.0 oz/week    5 Standard drinks or equivalent per week     Allergies   Ciprofloxacin   Review of Systems Review of Systems  10 systems reviewed and found to be negative, except as noted in the HPI.   Physical Exam Updated Vital Signs BP 133/89 (BP Location: Right Arm)   Pulse 65   Temp 98.2 F (36.8 C) (Oral)   Resp 16   Ht _0  (1.626 m)   Wt 59 kg   SpO2 99%   BMI 22.31 kg/m   Physical Exam  Constitutional: She is oriented to person, place, and time. She appears well-developed and well-nourished. No distress.  HENT:  Head: Normocephalic and  atraumatic.  Mouth/Throat: Oropharynx is clear and moist.  Eyes: Conjunctivae and EOM are normal. Pupils are equal, round, and reactive to light.  Neck: Normal range of motion. No JVD present. No tracheal deviation present.  Cardiovascular: Normal rate, regular rhythm and intact distal pulses.   Radial pulse equal bilaterally  Pulmonary/Chest: Effort normal and breath sounds normal. No stridor. No respiratory distress. She has no wheezes. She has no rales. She exhibits no tenderness.  Abdominal: Soft. She exhibits no distension and no mass. There is no tenderness. There is no rebound and no guarding.  Musculoskeletal: Normal range of motion. She exhibits no edema or tenderness.  No calf asymmetry, superficial collaterals, palpable cords, edema, Homans sign negative bilaterally.  Neurological: She is alert and oriented to person, place, and time.  Skin: Skin is warm. She is not diaphoretic.  Psychiatric: She has a normal mood and affect.  Slightly nervous and fidgety  Nursing note and vitals reviewed.    ED Treatments / Results  Labs (all labs ordered are listed, but only abnormal results are displayed) Labs Reviewed  BASIC METABOLIC PANEL - Abnormal; Notable for the following:       Result Value   CO2 21 (*)    Glucose, Bld 132 (*)    All other components within normal limits  CBC  I-STAT TROPOININ, ED  Randolm Idol, ED  I-STAT TROPOININ, ED    EKG  EKG Interpretation  Date/Time:  Thursday July 01 2016 15:55:49 EST Ventricular Rate:  66 PR Interval:  106 QRS Duration: 77 QT Interval:  427 QTC Calculation: 448 R Axis:   82 Text Interpretation:  Sinus rhythm Short PR interval No significant change was found Confirmed by CAMPOS  MD, KEVIN ([id number]) on 07/01/2016 3:59:43 PM       Radiology Dg Chest 2 View  Result Date: 07/01/2016 CLINICAL DATA:  Chest pain. EXAM: CHEST  2 VIEW COMPARISON:  Radiographs of March 23, 2010. FINDINGS: The heart size and mediastinal  contours are within normal limits. Both lungs are clear. No pneumothorax or pleural effusion is noted. The visualized skeletal structures are unremarkable. IMPRESSION: No active cardiopulmonary disease. Electronically Signed   By: Marijo Conception, M.D.   On: 07/01/2016 11:26    Procedures Procedures (including critical care time)  Medications Ordered in ED Medications  LORazepam (ATIVAN) tablet 1 mg (1 mg Oral Given 07/01/16 1236)     Initial Impression / Assessment and Plan / ED Course  I have reviewed the triage vital signs and the nursing notes.  Pertinent labs & imaging results that were available during my care of the patient were reviewed by me and considered in my medical decision making (see chart for details).     Vitals:   07/01/16 1315 07/01/16 1415 07/01/16 1430 07/01/16 1607  BP: 105/68 108/65 101/67 133/89  Pulse: (!) 59 69 64 65  Resp: _0 Temp:      TempSrc:      SpO2: 98% 98% 97% 99%  Weight:      Height:        Medications  LORazepam (ATIVAN) tablet 1 mg (1 mg Oral Given 07/01/16 1236)    [name] [name] is 46 y.o. female presenting with Chest pain intermittently over the last several weeks, it appears to be worse in the morning. Patient is low risk by heart score. EKG with no changes, troponin negative, blood work reassuring. She is particularly stressed I think there may be an element of anxiety. Patient is low risk by Wells score and PERC negative.  Chest pain has completely resolved after Ativan. Repeat troponin negative. She remains chest pain-free, will follow with cardiology.  This is a shared visit with the attending physician who personally evaluated the patient and agrees with the care plan.   Evaluation does not show pathology that would require ongoing emergent intervention or inpatient treatment. Pt is hemodynamically stable and mentating appropriately. Discussed findings and plan with patient/guardian, who agrees with care plan. All  questions answered. Return precautions discussed and outpatient follow up given.    Final Clinical Impressions(s) / ED Diagnoses   Final diagnoses:  Chest pain, unspecified type    New Prescriptions Discharge Medication List  as of 07/01/2016  4:09 PM       Monico Blitz, PA-C 07/01/16 [place], MD 07/02/16 [phone]

## 2016-07-01 NOTE — ED Triage Notes (Signed)
Patient here with chest pain that she reports as intermittent x 2 weeks, worse in the mornings then resolves as the day goes on, today pain to left back and jaw, took 1 baby asa pta

## 2016-07-04 NOTE — Progress Notes (Signed)
Cardiology Office Note    Date:  07/06/2016   ID:  [name] [name], DOB 09/21/1970, MRN [id number]  PCP:  No PCP Per Patient  Cardiologist:  New  Chief Complaint: CP  History of Present Illness:   [name] [name] is a 46 y.o. female IBS, GERD and anxiety presents for chest pain evaluation.   Seen in ER 07/01/16 for chest pain x several weeks. Her chest pain completely resolved after ativan. Electrolytes and kidney function were normal. Troponin x 2 negative. EKG showed non specific ST changes (depression) in inferior leads.  PACs. Discharged home from ER with outpatient cardiology follow up.   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 Intermittent chest pain since Nov 2017. Occurs with and without exertion. She had worse episode last Thursday 07/01/16 leading to ER visit. She woke up with feeling anxious and later had chest pain. Described as sharp/achy that radiated to her back and jaw. Also had palpitation lasted for about 30 second and resolved.   No reoccurrence since then. However feeling weak and fatigue. Intermittent dizziness. Feels achy chest all the times 2/10. Recently noted dyspnea on exertion. Especially climbing stair and during exercise. She achieves 10000 steps every day. Walks on treadmill for about 30 mins every day without worsening of symptoms. The patient denies nausea, vomiting, fever, palpitations, orthopnea, PND, dizziness, syncope, cough, congestion, abdominal pain, hematochezia, melena, lower extremity edema.  Hx of 15 pack year tobacco smoking. Quit in 2014. Using vapor since 2016. No use of illicit drug use. No family hx of CAD. She has hx of GERD and previously done well with Dexillant.   Past Medical History:  Diagnosis Date  . Anxiety   . Depression   . GERD (gastroesophageal reflux disease)   . IBS (irritable bowel syndrome)     Past Surgical History:  Procedure Laterality Date  . BILATERAL SALPINGECTOMY Bilateral 10/31/2012   Procedure: BILATERAL SALPINGECTOMY;  Surgeon: Lyman Speller, MD;  Location: [facility];  Service: Gynecology;  Laterality: Bilateral;  . CESAREAN SECTION    . CYSTO WITH HYDRODISTENSION N/A 10/31/2012   Procedure: CYSTOSCOPY/HYDRODISTENSION;  Surgeon: Reece Packer, MD;  Location: [facility];  Service: Urology;  Laterality: N/A;  . DILATION AND CURETTAGE OF UTERUS    . DILITATION & CURRETTAGE/HYSTROSCOPY WITH NOVASURE ABLATION N/A 10/31/2012   Procedure: DILATATION & CURETTAGE/HYSTEROSCOPY WITH NOVASURE ABLATION;  Surgeon: Lyman Speller, MD;  Location: [facility];  Service: Gynecology;  Laterality: N/A;  Dr Suzanne Boron Diarmid to start case with a cysto at beginning of case while patient under anesthesia.  . ENDOMETRIAL BIOPSY  09/19/12   neg  . LAPAROSCOPY Bilateral 10/31/2012   Procedure: LAPAROSCOPY OPERATIVE;  Surgeon: Lyman Speller, MD;  Location: [facility];  Service: Gynecology;  Laterality: Bilateral;    Current Medications: Prior to Admission medications   Medication Sig Start Date End Date Taking? Authorizing Provider  acetaminophen (TYLENOL) 325 MG tablet Take 650 mg by mouth every 6 (six) hours as needed for mild pain.    Historical Provider, MD  ALPRAZolam Duanne Moron) 0.5 MG tablet Take 1 tablet (0.5 mg total) by mouth daily as needed. for anxiety 02/17/16   Megan Salon, MD  clobetasol ointment (TEMOVATE) 3.26 % Apply 1 application topically 2 (two) times daily. Apply as directed twice daily for up to 7 days. Patient not taking: Reported on 07/01/2016 02/17/16   Megan Salon, MD  escitalopram (LEXAPRO) 5 MG tablet TAKE 1 TABLET(5  MG) BY MOUTH TWICE DAILY Patient taking differently: Take 5 mg by mouth daily.  02/17/16   Megan Salon, MD  ibuprofen (ADVIL,MOTRIN) 200 MG tablet Take 200 mg by mouth every 6 (six) hours as needed for moderate pain.    Historical Provider, MD  naproxen sodium (ANAPROX) 220 MG tablet Take 220 mg by mouth 2 (two) times daily as needed (pain).    Historical Provider,  MD  ondansetron (ZOFRAN ODT) 4 MG disintegrating tablet Take 1 tablet (4 mg total) by mouth every 8 (eight) hours as needed for nausea or vomiting. Patient not taking: Reported on 07/01/2016 11/18/14   Megan Salon, MD  ondansetron (ZOFRAN) 4 MG tablet Take 1 tablet (4 mg total) by mouth every 8 (eight) hours as needed for nausea or vomiting. 02/27/16   Megan Salon, MD  ranitidine (ZANTAC) 150 MG capsule Take 150 mg by mouth every evening.    Historical Provider, MD    Allergies:   Ciprofloxacin   Social History   Social History  . Marital status: Divorced    Spouse name: N/A  . Number of children: N/A  . Years of education: N/A   Social History Main Topics  . Smoking status: Former Smoker    Quit date: 01/15/2013  . Smokeless tobacco: Never Used  . Alcohol use 3.0 oz/week    5 Standard drinks or equivalent per week  . Drug use: No  . Sexual activity: Yes    Partners: Male    Birth control/ protection: Surgical     Comment: BSO   Other Topics Concern  . None   Social History Narrative   Married   1 son (2003)   Engineer, maintenance (IT)           Family History:  The patient's family history includes Club foot in her son; Hypertension in her father and mother; Kidney disease in her father; Non-Hodgkin's lymphoma in her father; Stroke in her father.   ROS:   Please see the history of present illness.    ROS All other systems reviewed and are negative.   PHYSICAL EXAM:   VS:  BP 102/60 (BP Location: Left Arm, Patient Position: Sitting, Cuff Size: Normal)   Pulse 69   Ht 5' 3.5" (1.613 m)   Wt 130 lb 6.4 oz (59.1 kg)   SpO2 99%   BMI 22.74 kg/m    GEN: Well nourished, well developed, in no acute distress  HEENT: normal  Neck: no JVD, carotid bruits, or masses Cardiac: RRR; no murmurs, rubs, or gallops,no edema  Respiratory:  clear to auscultation bilaterally, normal work of breathing GI: soft, nontender, nondistended, + BS MS: no deformity or atrophy  Skin: warm and dry, no  rash Neuro:  Alert and Oriented x 3, Strength and sensation are intact Psych: euthymic mood, full affect  Wt Readings from Last 3 Encounters:  07/06/16 130 lb 6.4 oz (59.1 kg)  07/01/16 130 lb (59 kg)  02/17/16 132 lb (59.9 kg)      Studies/Labs Reviewed:   EKG:  EKG is ordered today.  The ekg ordered today demonstrates NSR at rate of 69 bpm.   Recent Labs: 07/01/2016: BUN 12; Creatinine, Ser 0.82; Hemoglobin 13.3; Platelets 258; Potassium 4.3; Sodium 138   Lipid Panel    Component Value Date/Time   CHOL 182 02/19/2011 0919   TRIG 51.0 02/19/2011 0919   HDL 63.80 02/19/2011 0919   CHOLHDL 3 02/19/2011 0919   VLDL 10.2 02/19/2011 0919  [place] 108 (H) 02/19/2011 0919    Additional studies/ records that were reviewed today include:   As above   ASSESSMENT & PLAN:    1. Chest pain - Has both typical and atypical features. Worse episode 2/1 leading to ER presentation. Initial EKG showed non specific ST changes in inferior leads. Now fatigue, weak and dyspneic on exertion. However no changes in symptoms with exercise. EKG without acute changes today. Discussed with DOD Dr. Curt Bears. Given her symptoms and EKG changes will get exercise Myoivew. Start ASA 60m for now. She had done well in past on Dexilant, will restart. Advise to work on anxiety and stress--> follow up with PCP. She will call [place] go to ER if worsening of symptoms.   2. Tobacco smoking - Prior hx of 15 pack year tobacco smoking. Currently using vapors x 2 years. Advised complete cessation.   Medication Adjustments/Labs and Tests Ordered: Current medicines are reviewed at length with the patient today.  Concerns regarding medicines are outlined above.  Medication changes, Labs and Tests ordered today are listed in the Patient Instructions below. Patient Instructions  Medication Instructions:  START Aspirin 81 MG daily. START Dexlansoprazole 30 MG daily.  Labwork: NONE  Testing/Procedures: Your physician  has requested that you have en exercise stress myoview. For further information please visit [email or web address] Please follow instruction sheet, as given.    Follow-Up: Your physician recommends that you schedule a follow-up appointment in: 2-3 weeks after stress test with VRobbie Lis PA.   Any Other Special Instructions Will Be Listed Below (If Applicable).     If you need a refill on your cardiac medications before your next appointment, please call your pharmacy.     SJarrett Soho PUtah 07/06/2016 8:56 AM    [facility] [address] [place]  [id number]: [phone] Fax: [phone]

## 2016-07-06 ENCOUNTER — Ambulatory Visit (INDEPENDENT_AMBULATORY_CARE_PROVIDER_SITE_OTHER): Payer: Commercial Managed Care - HMO | Admitting: Physician Assistant

## 2016-07-06 ENCOUNTER — Encounter: Payer: Self-pay | Admitting: Physician Assistant

## 2016-07-06 VITALS — BP 102/60 | HR 69 | Ht 63.5 in | Wt 130.4 lb

## 2016-07-06 DIAGNOSIS — Z720 Tobacco use: Secondary | ICD-10-CM

## 2016-07-06 DIAGNOSIS — R079 Chest pain, unspecified: Secondary | ICD-10-CM | POA: Diagnosis not present

## 2016-07-06 MED ORDER — ASPIRIN EC 81 MG PO TBEC: 81.0000 mg | DELAYED_RELEASE_TABLET | Freq: Every day | ORAL | 3 refills | Status: DC

## 2016-07-06 MED ORDER — DEXLANSOPRAZOLE 30 MG PO CPDR: 30.0000 mg | DELAYED_RELEASE_CAPSULE | Freq: Every day | ORAL | 3 refills | Status: DC

## 2016-07-06 NOTE — Patient Instructions (Signed)
Medication Instructions:  START Aspirin 81 MG daily. START Dexlansoprazole 30 MG daily.  Labwork: NONE  Testing/Procedures: Your physician has requested that you have en exercise stress myoview. For further information please visit [email or web address]. Please follow instruction sheet, as given.    Follow-Up: Your physician recommends that you schedule a follow-up appointment in: 2-3 weeks after stress test with Robbie Lis, PA.   Any Other Special Instructions Will Be Listed Below (If Applicable).     If you need a refill on your cardiac medications before your next appointment, please call your pharmacy.

## 2016-07-08 ENCOUNTER — Ambulatory Visit (HOSPITAL_COMMUNITY): Payer: Commercial Managed Care - HMO | Attending: Cardiology

## 2016-07-08 DIAGNOSIS — R079 Chest pain, unspecified: Secondary | ICD-10-CM | POA: Diagnosis not present

## 2016-07-08 DIAGNOSIS — I510 Cardiac septal defect, acquired: Secondary | ICD-10-CM | POA: Insufficient documentation

## 2016-07-08 DIAGNOSIS — R9439 Abnormal result of other cardiovascular function study: Secondary | ICD-10-CM | POA: Insufficient documentation

## 2016-07-08 LAB — MYOCARDIAL PERFUSION IMAGING
CHL CUP NUCLEAR SRS: 8
CSEPED: 12 min
CSEPEW: 13.4 METS
Exercise duration (sec): 0 s
LHR: 0.3
LV dias vol: 68 mL (ref 46–106)
LV sys vol: 31 mL
MPHR: 173 {beats}/min
NUC STRESS TID: 1.09
Peak HR: 169 {beats}/min
Percent HR: 98 %
RPE: 17
Rest HR: 59 {beats}/min
SDS: 2
SSS: 10

## 2016-07-08 IMAGING — NM NM MISC PROCEDURE
9 series · 54 of 54 positions shown · non-contrast
Comparison: none

[Series 1: wbr_r-proj_st rest · 6.51mm/px · 6 of 64 frames shown]
[frame 6/64]
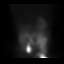
[frame 16/64]
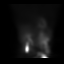
[frame 27/64]
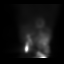
[frame 38/64]
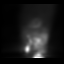
[frame 48/64]
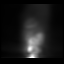
[frame 59/64]
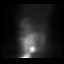

[Series 1: rest · 6.51mm/px · 6 of 64 frames shown]
[frame 6/64]
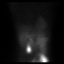
[frame 16/64]
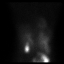
[frame 27/64]
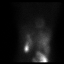
[frame 38/64]
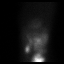
[frame 48/64]
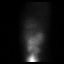
[frame 59/64]
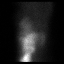

[Series 1: wbr_s-proj_st stress_(id)_sa · 6.5mm · 6.51mm/px · 6 of 512 frames shown (1 of 2)]
[frame 43/512]
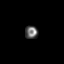
[frame 128/512]
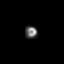
[frame 214/512]
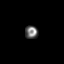
[frame 299/512]
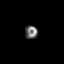
[frame 384/512]
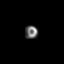
[frame 470/512]
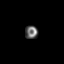

[Series 1: wbr_s-proj_st stress_(id)_sa · 6.5mm · 6.51mm/px · 6 of 64 frames shown (2 of 2)]
[frame 6/64]
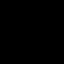
[frame 16/64]
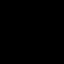
[frame 27/64]
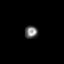
[frame 38/64]
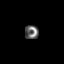
[frame 48/64]
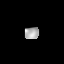
[frame 59/64]
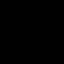

[Series 1: wbr_r-proj_st rest_(id)_sa · 6.5mm · 6.51mm/px · 6 of 64 frames shown]
[frame 6/64]
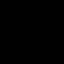
[frame 16/64]
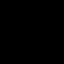
[frame 27/64]
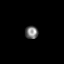
[frame 38/64]
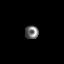
[frame 48/64]
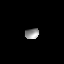
[frame 59/64]
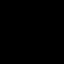

[Series 2: stress · 6.51mm/px · 6 of 512 frames shown (1 of 2)]
[frame 43/512]
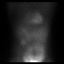
[frame 128/512]
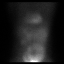
[frame 214/512]
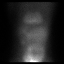
[frame 299/512]
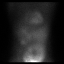
[frame 384/512]
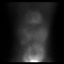
[frame 470/512]
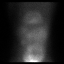

[Series 2: wbr_s-proj_st stress · 6.51mm/px · 6 of 512 frames shown (1 of 2)]
[frame 43/512]
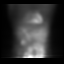
[frame 128/512]
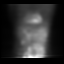
[frame 214/512]
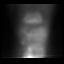
[frame 299/512]
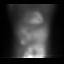
[frame 384/512]
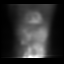
[frame 470/512]
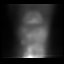

[Series 2: stress · 6.51mm/px · 6 of 64 frames shown (2 of 2)]
[frame 6/64]
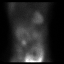
[frame 16/64]
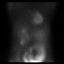
[frame 27/64]
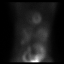
[frame 38/64]
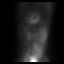
[frame 48/64]
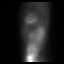
[frame 59/64]
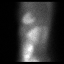

[Series 2: wbr_s-proj_st stress · 6.51mm/px · 6 of 64 frames shown (2 of 2)]
[frame 6/64]
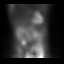
[frame 16/64]
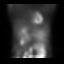
[frame 27/64]
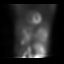
[frame 38/64]
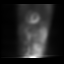
[frame 48/64]
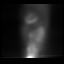
[frame 59/64]
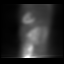

[54 of 54 positions shown; findings below may reference images not displayed]

Canned report from images found in remote index.

Refer to host system for actual result text.

## 2016-07-08 MED ORDER — TECHNETIUM TC 99M TETROFOSMIN IV KIT
10.7000 | PACK | Freq: Once | INTRAVENOUS | Status: AC | PRN
  Administered 2016-07-08: 10.7 via INTRAVENOUS
  Filled 2016-07-08: qty 11

## 2016-07-08 MED ORDER — TECHNETIUM TC 99M TETROFOSMIN IV KIT
33.0000 | PACK | Freq: Once | INTRAVENOUS | Status: AC | PRN
  Administered 2016-07-08: 33 via INTRAVENOUS
  Filled 2016-07-08: qty 33

## 2016-07-12 ENCOUNTER — Encounter (HOSPITAL_COMMUNITY): Payer: Commercial Managed Care - HMO

## 2016-07-20 NOTE — Progress Notes (Signed)
Cardiology Office Note    Date:  07/21/2016   ID:  [name] [name], DOB 08-Feb-1971, MRN [id number]  PCP:  No PCP Per Patient  Cardiologist:  New to Dr. Curt Bears  Chief Complaint: Chest pain   History of Present Illness:   [name] [name] is a 46 y.o. female with PMH of IBS, GERD and anxiety who recently seen for chest pain evaluation presents for follow up.   Seen in ER 07/01/16 for chest pain x several weeks. Her chest pain completely resolved after ativan. Electrolytes and kidney function were normal. Troponin x 2 negative. EKG showed non specific ST changes (depression) in inferior leads.  PACs. Discharged home from ER with outpatient cardiology follow up.   Prior hx of 15 pack year tobacco smoking. Currently using vapors x 2 years. Advised complete cessation.  Seen by me 07/06/16 for evaluation. 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 She has both typical and atypical  features. Initial EKG showed non specific ST changes in inferior leads. Now fatigue, weak and dyspneic on exertion. However no changes in symptoms with exercise. After discussion with  DOD Dr. Curt Bears -->Given her symptoms and EKG changes will get exercise Myoivew. Start ASA 47m and Dexilant. Also advised to work on anxiety and stress.   Exercise Myoview 07/08/16  Nuclear stress EF: 54%.  Upsloping ST segment depression ST segment depression of 1 mm was noted during stress in the V5 and V6 leads, beginning at 10 minutes of stress, and returning to baseline after less than 1 minute of recovery.  Defect 1: There is a medium defect of moderate severity present in the mid anteroseptal, mid inferoseptal and apical septal location.   Low risk study with a fixed mid septal defect, likely an artifact. Ischemia is not seen. Low normal LV systolic function. Cannot exclude old septal scar, but the anatomical distribution is not typical for coronary disease. LBBB was not present at baseline or during  exercise.   Today presents for follow up. He has cut back on her caffeine intake and increase her water intake to at least 64 oz a day with resolution of her symptoms. She feels totally different person now. The patient denies nausea, vomiting, fever, chest pain, palpitations, shortness of breath, orthopnea, PND, dizziness, syncope, cough, congestion, abdominal pain, hematochezia, melena, lower extremity edema.   Past Medical History:  Diagnosis Date  . Anxiety   . Depression   . GERD (gastroesophageal reflux disease)   . IBS (irritable bowel syndrome)     Past Surgical History:  Procedure Laterality Date  . BILATERAL SALPINGECTOMY Bilateral 10/31/2012   Procedure: BILATERAL SALPINGECTOMY;  Surgeon: MLyman Speller MD;  [facility];  Service: Gynecology;  Laterality: Bilateral;  . CESAREAN SECTION    . CYSTO WITH HYDRODISTENSION N/A 10/31/2012   Procedure: CYSTOSCOPY/HYDRODISTENSION;  Surgeon: SReece Packer MD;  [facility];  Service: Urology;  Laterality: N/A;  . DILATION AND CURETTAGE OF UTERUS    . DILITATION & CURRETTAGE/HYSTROSCOPY WITH NOVASURE ABLATION N/A 10/31/2012   Procedure: DILATATION & CURETTAGE/HYSTEROSCOPY WITH NOVASURE ABLATION;  Surgeon: MLyman Speller MD;  [facility];  Service: Gynecology;  Laterality: N/A;  Dr MSuzanne BoronDiarmid to start case with a cysto at beginning of case while patient under anesthesia.  . ENDOMETRIAL BIOPSY  09/19/12   neg  . LAPAROSCOPY Bilateral 10/31/2012   Procedure: LAPAROSCOPY OPERATIVE;  Surgeon: MLyman Speller MD;  [facility];  Service: Gynecology;  Laterality: Bilateral;    Current Medications: Prior to Admission medications   Medication Sig Start Date End Date Taking? Authorizing Provider  acetaminophen (TYLENOL) 325 MG tablet Take 650 mg by mouth every 6 (six) hours as needed for mild pain.    Historical Provider, MD  ALPRAZolam Duanne Moron) 0.5 MG tablet Take 1 tablet (0.5 mg total) by mouth daily as  needed. for anxiety 02/17/16   Megan Salon, MD  aspirin EC 81 MG tablet Take 1 tablet (81 mg total) by mouth daily. 07/06/16   Female Minish, PA  clobetasol ointment (TEMOVATE) 3.23 % Apply 1 application topically 2 (two) times daily. Apply as directed twice daily for up to 7 days. 02/17/16   Megan Salon, MD  Dexlansoprazole 30 MG capsule Take 1 capsule (30 mg total) by mouth daily. 07/06/16   Janneth Krasner, PA  escitalopram (LEXAPRO) 5 MG tablet Take 5 mg by mouth daily.    Historical Provider, MD  ibuprofen (ADVIL,MOTRIN) 200 MG tablet Take 200 mg by mouth every 6 (six) hours as needed for moderate pain.    Historical Provider, MD  naproxen sodium (ANAPROX) 220 MG tablet Take 220 mg by mouth 2 (two) times daily as needed (pain).    Historical Provider, MD  ondansetron (ZOFRAN) 4 MG tablet Take 1 tablet (4 mg total) by mouth every 8 (eight) hours as needed for nausea or vomiting. 02/27/16   Megan Salon, MD  ranitidine (ZANTAC) 150 MG capsule Take 150 mg by mouth every evening.    Historical Provider, MD    Allergies:   Ciprofloxacin   Social History   Social History  . Marital status: Divorced    Spouse name: N/A  . Number of children: N/A  . Years of education: N/A   Social History Main Topics  . Smoking status: Former Smoker    Quit date: 01/15/2013  . Smokeless tobacco: Never Used  . Alcohol use 3.0 oz/week    5 Standard drinks or equivalent per week  . Drug use: No  . Sexual activity: Yes    Partners: Male    Birth control/ protection: Surgical     Comment: BSO   Other Topics Concern  . None   Social History Narrative   Married   1 son (2003)   Engineer, maintenance (IT)           Family History:  The patient's family history includes Club foot in her son; Hypertension in her father and mother; Kidney disease in her father; Non-Hodgkin's lymphoma in her father; Stroke in her father.   ROS:   Please see the history of present illness.    ROS All other systems reviewed and are  negative.   PHYSICAL EXAM:   VS:  BP 100/60   Pulse 72   Ht 5' 3.5" (1.613 m)   Wt 133 lb (60.3 kg)   BMI 23.19 kg/m    GEN: Well nourished, well developed, in no acute distress  HEENT: normal  Neck: no JVD, carotid bruits, or masses Cardiac: RRR; no murmurs, rubs, or gallops,no edema  Respiratory:  clear to auscultation bilaterally, normal work of breathing GI: soft, nontender, nondistended, + BS MS: no deformity or atrophy  Skin: warm and dry, no rash Neuro:  Alert and Oriented x 3, Strength and sensation are intact Psych: euthymic mood, full affect  Wt Readings from Last 3 Encounters:  07/21/16 133 lb (60.3 kg)  07/08/16 130 lb (59 kg)  07/06/16 130 lb 6.4 oz (59.1 kg)  Studies/Labs Reviewed:   EKG:  EKG is not ordered today.   Recent Labs: 07/01/2016: BUN 12; Creatinine, Ser 0.82; Hemoglobin 13.3; Platelets 258; Potassium 4.3; Sodium 138   Lipid Panel    Component Value Date/Time   CHOL 182 02/19/2011 0919   TRIG 51.0 02/19/2011 0919   HDL 63.80 02/19/2011 0919   CHOLHDL 3 02/19/2011 0919   VLDL 10.2 02/19/2011 0919   LDLCALC 108 (H) 02/19/2011 0919    Additional studies/ records that were reviewed today include:   As above    ASSESSMENT & PLAN:    1. Chest pain - Completely  resolved after cut back on caffeine intake and increase fluid intake. Normal stress test. No change in therapy. F/u with PCP and with [place] PRN.    2. Tobacco smoking - Prior hx of 15 pack year tobacco smoking. Currently using vapors x 2 years. Advised complete cessation   Medication Adjustments/Labs and Tests Ordered: Current medicines are reviewed at length with the patient today.  Concerns regarding medicines are outlined above.  Medication changes, Labs and Tests ordered today are listed in the Patient Instructions below. Patient Instructions  Medication Instructions:  Your physician recommends that you continue on your current medications as directed. Please refer to the  Current Medication list given to you today.   Labwork: None ordered  Testing/Procedures: None ordered  Follow-Up: Your physician recommends that you schedule a follow-up appointment as needed   Any Other Special Instructions Will Be Listed Below (If Applicable).     If you need a refill on your cardiac medications before your next appointment, please call your pharmacy.      Jarrett Soho, [place]  07/21/2016 10:56 AM    [facility] [address], [place], [place]  [address] Phone: [phone]; Fax: [phone]

## 2016-07-21 ENCOUNTER — Encounter: Payer: Self-pay | Admitting: Physician Assistant

## 2016-07-21 ENCOUNTER — Ambulatory Visit (INDEPENDENT_AMBULATORY_CARE_PROVIDER_SITE_OTHER): Payer: Commercial Managed Care - HMO | Admitting: Physician Assistant

## 2016-07-21 VITALS — BP 100/60 | HR 72 | Ht 63.5 in | Wt 133.0 lb

## 2016-07-21 DIAGNOSIS — R0789 Other chest pain: Secondary | ICD-10-CM | POA: Diagnosis not present

## 2016-07-21 DIAGNOSIS — Z720 Tobacco use: Secondary | ICD-10-CM | POA: Diagnosis not present

## 2016-07-21 NOTE — Patient Instructions (Signed)
Medication Instructions:  Your physician recommends that you continue on your current medications as directed. Please refer to the Current Medication list given to you today.  Labwork: None ordered.  Testing/Procedures: None ordered.  Follow-Up: Your physician recommends that you schedule a follow-up appointment as needed.   Any Other Special Instructions Will Be Listed Below (If Applicable).     If you need a refill on your cardiac medications before your next appointment, please call your pharmacy.   

## 2016-08-09 ENCOUNTER — Other Ambulatory Visit: Payer: Self-pay | Admitting: Obstetrics & Gynecology

## 2016-08-09 NOTE — Telephone Encounter (Signed)
Medication refill request: xanax  Last AEX:  02/17/16 SM Next AEX: 05/19/17 SM  Last MMG (if hormonal medication request): 06/04/15 [place] Left BIRADS2:Benign  Refill authorized: 02/17/16 #[id number]. Today please advise.

## 2016-08-19 NOTE — Telephone Encounter (Signed)
Fax failed 5 times, so prescription was called into [facility] on file.

## 2016-08-19 NOTE — Telephone Encounter (Signed)
Prescription was faxed to [facility] today.

## 2016-10-18 ENCOUNTER — Other Ambulatory Visit: Payer: Self-pay | Admitting: Obstetrics & Gynecology

## 2016-10-18 DIAGNOSIS — Z1231 Encounter for screening mammogram for malignant neoplasm of breast: Secondary | ICD-10-CM

## 2016-11-10 ENCOUNTER — Ambulatory Visit
Admission: RE | Admit: 2016-11-10 | Discharge: 2016-11-10 | Disposition: A | Discharge status: Home / Self Care | Payer: Commercial Managed Care - HMO | Source: Ambulatory Visit | Attending: Obstetrics & Gynecology | Admitting: Obstetrics & Gynecology

## 2016-11-10 DIAGNOSIS — Z1231 Encounter for screening mammogram for malignant neoplasm of breast: Secondary | ICD-10-CM

## 2016-11-10 IMAGING — MG 2D DIGITAL SCREENING BILATERAL MAMMOGRAM WITH CAD AND ADJUNCT TO
9 of 12 series · 9 of 28 positions shown · non-contrast
Comparison: Previous exam(s).

CLINICAL DATA: Screening.

EXAM:
2D DIGITAL SCREENING BILATERAL MAMMOGRAM WITH CAD AND ADJUNCT TOMO

[L MLO synth-2D]
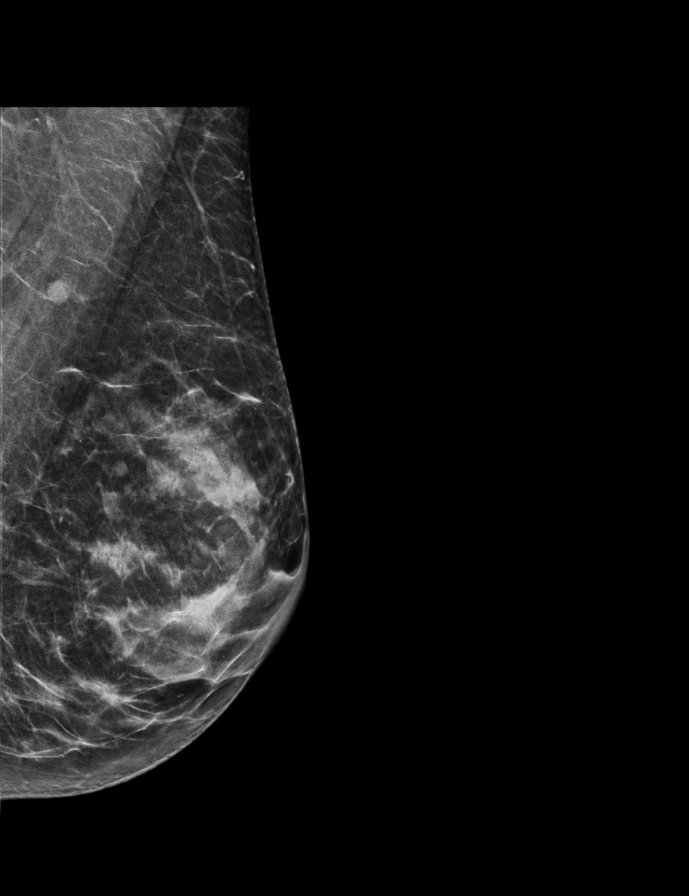

[R MLO synth-2D]
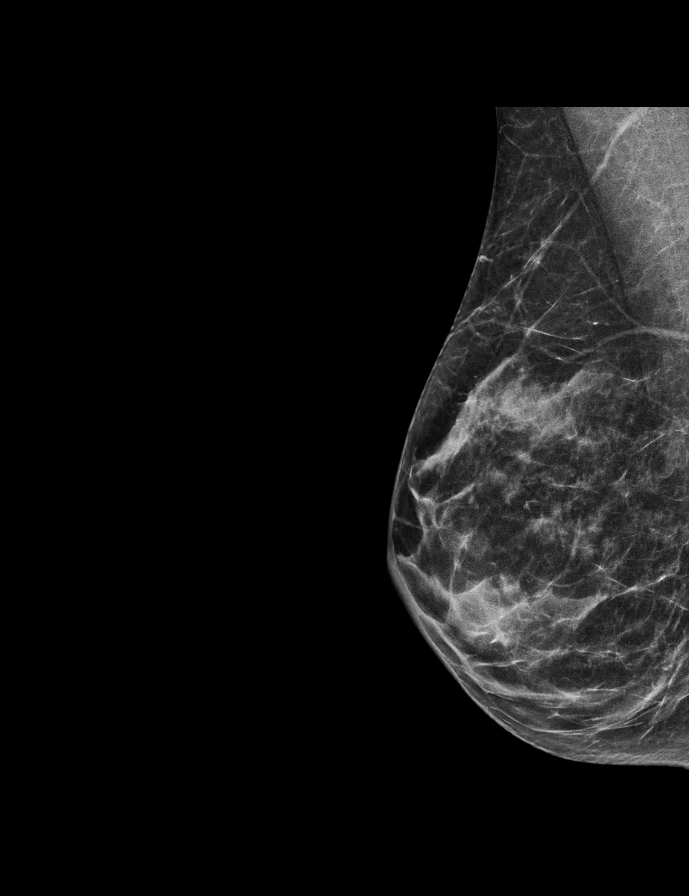

[R CC synth-2D]
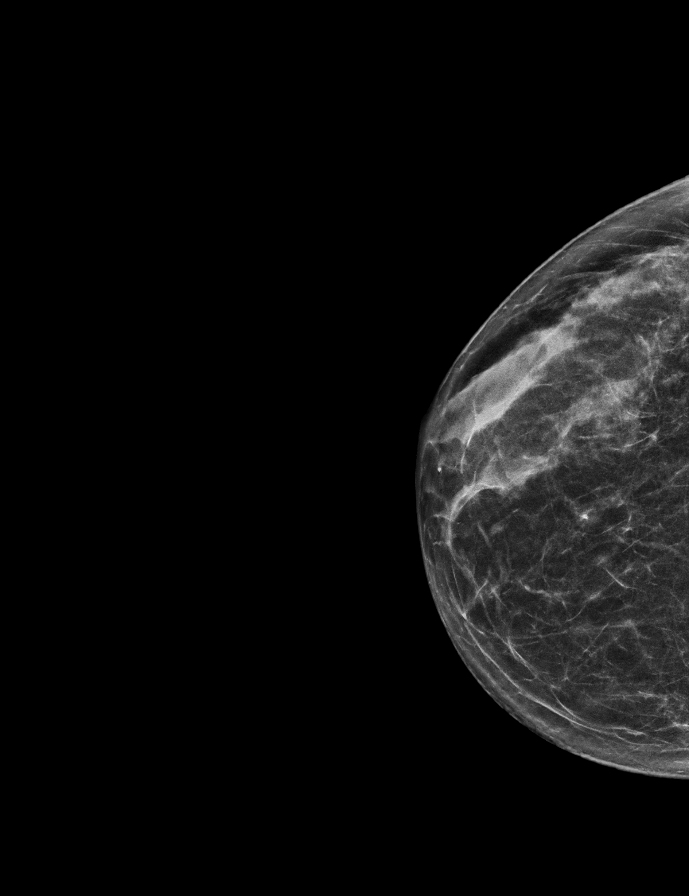

[L CC]
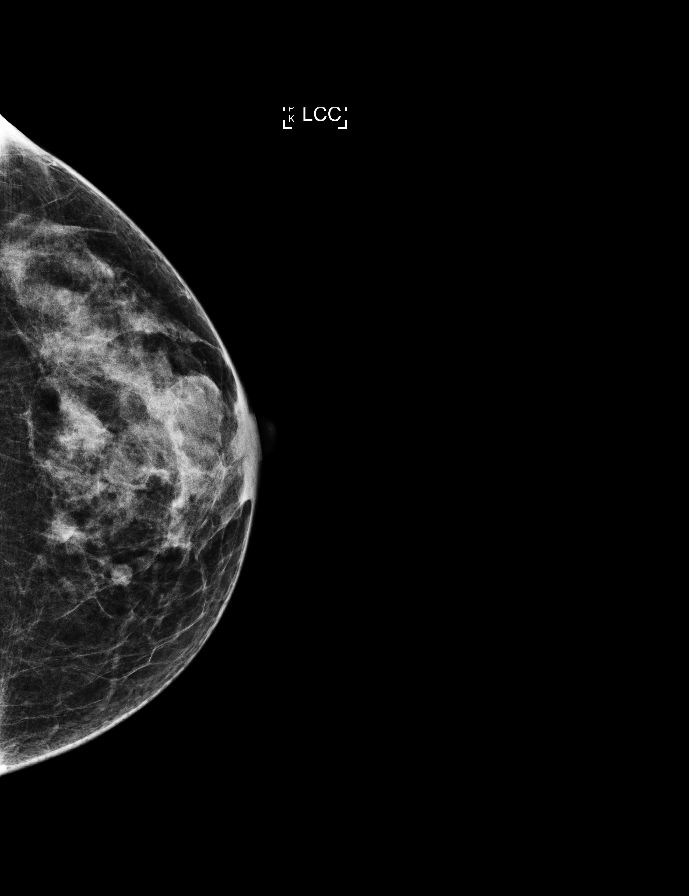

[R MLO]
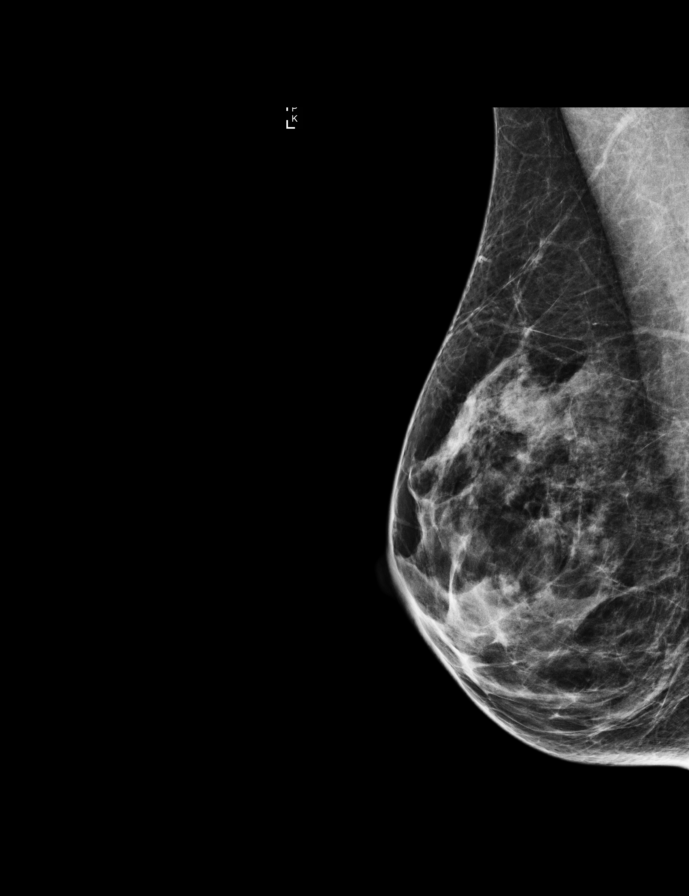

[L CC synth-2D]
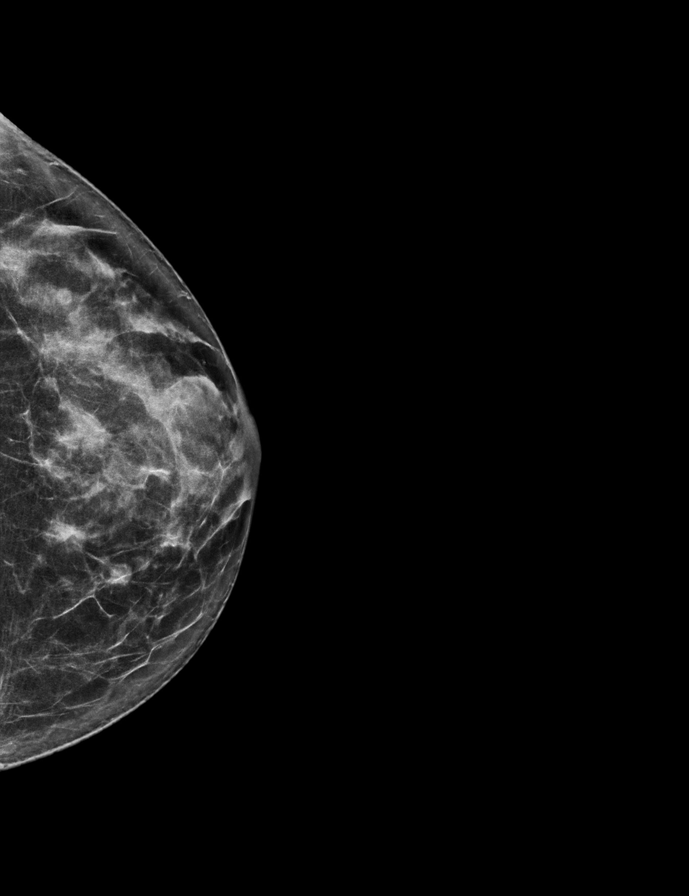

[L MLO]
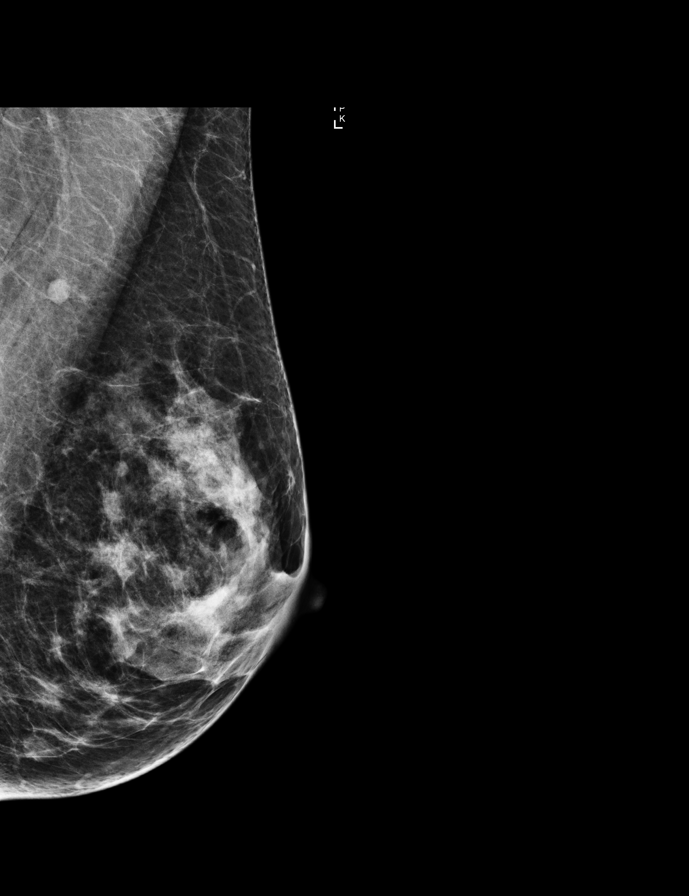

[R CC]
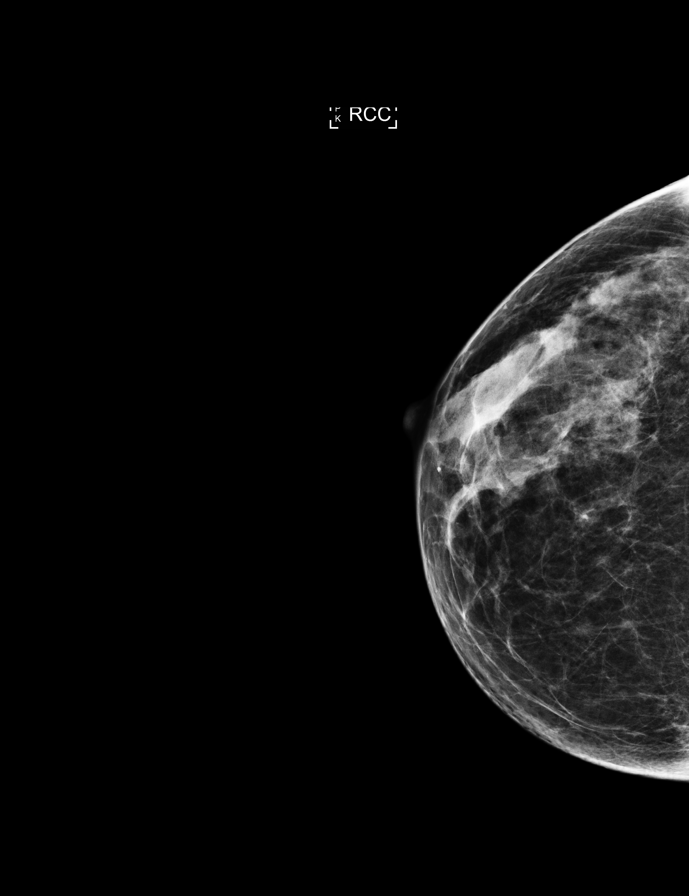

[L MLO tomo · tomo slice 29/58.0]
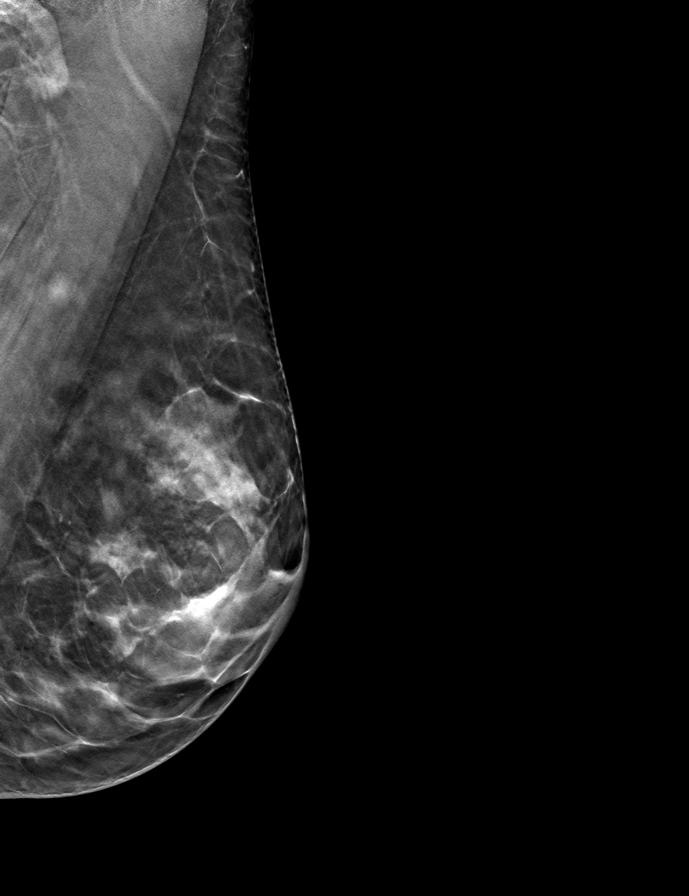

[9 of 28 positions shown; findings below may reference images not displayed]

ACR Breast Density Category c: The breast tissue is heterogeneously
dense, which may obscure small masses.
FINDINGS: There are no findings suspicious for malignancy. Images were
processed with CAD.
IMPRESSION: No mammographic evidence of malignancy. A result letter of this
screening mammogram will be mailed directly to the patient.

RECOMMENDATION:
Screening mammogram in one year. (Code:[TA])

BI-RADS CATEGORY  1: Negative.

## 2017-03-31 HISTORY — PX: AUGMENTATION MAMMAPLASTY: SUR837

## 2017-04-12 HISTORY — PX: BREAST ENHANCEMENT SURGERY: SHX7

## 2017-05-19 ENCOUNTER — Encounter: Payer: Self-pay | Admitting: Obstetrics & Gynecology

## 2017-05-19 ENCOUNTER — Ambulatory Visit (INDEPENDENT_AMBULATORY_CARE_PROVIDER_SITE_OTHER): Payer: 59 | Admitting: Obstetrics & Gynecology

## 2017-05-19 ENCOUNTER — Other Ambulatory Visit (HOSPITAL_COMMUNITY)
Admission: RE | Admit: 2017-05-19 | Discharge: 2017-05-19 | Disposition: A | Discharge status: Home / Self Care | Payer: 59 | Source: Ambulatory Visit | Attending: Obstetrics & Gynecology | Admitting: Obstetrics & Gynecology

## 2017-05-19 VITALS — BP 98/66 | HR 92 | Resp 16 | Ht 63.75 in | Wt 133.0 lb

## 2017-05-19 DIAGNOSIS — Z9851 Tubal ligation status: Secondary | ICD-10-CM | POA: Insufficient documentation

## 2017-05-19 DIAGNOSIS — Z0000 Encounter for general adult medical examination without abnormal findings: Secondary | ICD-10-CM

## 2017-05-19 DIAGNOSIS — R87810 Cervical high risk human papillomavirus (HPV) DNA test positive: Secondary | ICD-10-CM | POA: Diagnosis not present

## 2017-05-19 DIAGNOSIS — Z01419 Encounter for gynecological examination (general) (routine) without abnormal findings: Secondary | ICD-10-CM

## 2017-05-19 DIAGNOSIS — Z1151 Encounter for screening for human papillomavirus (HPV): Secondary | ICD-10-CM | POA: Insufficient documentation

## 2017-05-19 DIAGNOSIS — R87610 Atypical squamous cells of undetermined significance on cytologic smear of cervix (ASC-US): Secondary | ICD-10-CM | POA: Diagnosis not present

## 2017-05-19 DIAGNOSIS — Z124 Encounter for screening for malignant neoplasm of cervix: Secondary | ICD-10-CM | POA: Diagnosis not present

## 2017-05-19 MED ORDER — ONDANSETRON HCL 4 MG PO TABS: 4.0000 mg | ORAL_TABLET | Freq: Three times a day (TID) | ORAL | 0 refills | Status: DC | PRN

## 2017-05-19 MED ORDER — ALPRAZOLAM 0.5 MG PO TABS: ORAL_TABLET | ORAL | 0 refills | Status: DC

## 2017-05-19 MED ORDER — ESCITALOPRAM OXALATE 5 MG PO TABS: 5.0000 mg | ORAL_TABLET | Freq: Two times a day (BID) | ORAL | 12 refills | Status: DC

## 2017-05-19 MED ORDER — CLOBETASOL PROPIONATE 0.05 % EX OINT: 1.0000 "application " | TOPICAL_OINTMENT | Freq: Two times a day (BID) | CUTANEOUS | 2 refills | Status: DC

## 2017-05-19 NOTE — Progress Notes (Signed)
46 y.o. [id number] DivorcedCaucasianF here for annual exam.  Doing well.  Had panic attack in February after drinking a lot of coffee and having too busy of a morning.    Denies vaginal bleeding.    Engaged since Valentine's Day.  Getting married next August.  Has been with same person for 3 1/2 years.  No LMP recorded. Patient has had an ablation.          Sexually active: Yes.    The current method of family planning is tubal ligation.    Exercising: Yes.    running, walking, weight training  Smoker:  no  Health Maintenance: Pap:  02/17/16 neg. HR HPV:+detected. #16, 18/45 Neg.  History of abnormal Pap:  yes MMG:  11/10/16 BIRADS1:neg  Colonoscopy:  01/01/13 f/u 5 years  BMD:   Never TDaP:  01/2016 Pneumonia vaccine(s):  N/A Zostavax:   N/A Hep C testing: N/A Screening Labs:fasting labs today   reports that she quit smoking about 4 years ago. she has never used smokeless tobacco. She reports that she drinks about 3.0 oz of alcohol per week. She reports that she does not use drugs.  Past Medical History:  Diagnosis Date  . Anxiety   . Depression   . GERD (gastroesophageal reflux disease)   . IBS (irritable bowel syndrome)     Past Surgical History:  Procedure Laterality Date  . BILATERAL SALPINGECTOMY Bilateral 10/31/2012   Procedure: BILATERAL SALPINGECTOMY;  Surgeon: Lyman Speller, MD;  Location: [facility];  Service: Gynecology;  Laterality: Bilateral;  . BREAST ENHANCEMENT SURGERY  04/12/2017  . CESAREAN SECTION    . CYSTO WITH HYDRODISTENSION N/A 10/31/2012   Procedure: CYSTOSCOPY/HYDRODISTENSION;  Surgeon: Reece Packer, MD;  Location: [facility];  Service: Urology;  Laterality: N/A;  . DILATION AND CURETTAGE OF UTERUS    . DILITATION & CURRETTAGE/HYSTROSCOPY WITH NOVASURE ABLATION N/A 10/31/2012   Procedure: DILATATION & CURETTAGE/HYSTEROSCOPY WITH NOVASURE ABLATION;  Surgeon: Lyman Speller, MD;  Location: [facility];  Service: Gynecology;  Laterality: N/A;  Dr Suzanne Boron Diarmid  to start case with a cysto at beginning of case while patient under anesthesia.  . ENDOMETRIAL BIOPSY  09/19/12   neg  . LAPAROSCOPY Bilateral 10/31/2012   Procedure: LAPAROSCOPY OPERATIVE;  Surgeon: Lyman Speller, MD;  Location: [facility];  Service: Gynecology;  Laterality: Bilateral;    Current Outpatient Medications  Medication Sig Dispense Refill  . acetaminophen (TYLENOL) 325 MG tablet Take 650 mg by mouth every 6 (six) hours as needed for mild pain.    Marland Kitchen ALPRAZolam (XANAX) 0.5 MG tablet TAKE 1 TABLET BY MOUTH EVERY DAY AS NEEDED FOR ANXIETY 30 tablet 0  . aspirin EC 81 MG tablet Take 1 tablet (81 mg total) by mouth daily. 90 tablet 3  . clobetasol ointment (TEMOVATE) 3.23 % Apply 1 application topically 2 (two) times daily. Apply as directed twice daily for up to 7 days. 60 g 2  . escitalopram (LEXAPRO) 5 MG tablet Take 5 mg by mouth daily.    Marland Kitchen ibuprofen (ADVIL,MOTRIN) 200 MG tablet Take 200 mg by mouth every 6 (six) hours as needed for moderate pain.    . naproxen sodium (ANAPROX) 220 MG tablet Take 220 mg by mouth 2 (two) times daily as needed (pain).    . ondansetron (ZOFRAN) 4 MG tablet Take 1 tablet (4 mg total) by mouth every 8 (eight) hours as needed for nausea or vomiting. 30 tablet 0  . ranitidine (ZANTAC) 150 MG capsule Take  150 mg by mouth every evening.     No current facility-administered medications for this visit.     Family History  Problem Relation Age of Onset  . Stroke Father   . Non-Hodgkin's lymphoma Father   . Hypertension Father   . Kidney disease Father   . Hypertension Mother   . Club foot Son     ROS:  Pertinent items are noted in HPI.  Otherwise, a comprehensive ROS was negative.  Exam:   BP 98/66 (BP Location: Right Arm, Patient Position: Sitting, Cuff Size: Normal)   Pulse 92   Resp 16   Ht 5' 3.75" (1.619 m)   Wt 133 lb (60.3 kg)   BMI 23.01 kg/m   Weight change: +1#   Height: 5' 3.75" (161.9 cm)  Ht Readings from Last 3 Encounters:   05/19/17 5' 3.75" (1.619 m)  07/21/16 5' 3.5" (1.613 m)  07/08/16 5' 3"  (1.6 m)    General appearance: alert, cooperative and appears stated age Head: Normocephalic, without obvious abnormality, atraumatic Neck: no adenopathy, supple, symmetrical, trachea midline and thyroid normal to inspection and palpation Lungs: clear to auscultation bilaterally Breasts: normal appearance, no masses or tenderness Heart: regular rate and rhythm Abdomen: soft, non-tender; bowel sounds normal; no masses,  no organomegaly Extremities: extremities normal, atraumatic, no cyanosis or edema Skin: Skin color, texture, turgor normal. No rashes or lesions Lymph nodes: Cervical, supraclavicular, and axillary nodes normal. No abnormal inguinal nodes palpated Neurologic: Grossly normal   Pelvic: External genitalia:  no lesions              Urethra:  normal appearing urethra with no masses, tenderness or lesions              Bartholins and Skenes: normal                 Vagina: normal appearing vagina with normal color and discharge, no lesions              Cervix: no lesions              Pap taken: Yes.   Bimanual Exam:  Uterus:  normal size, contour, position, consistency, mobility, non-tender              Adnexa: normal adnexa and no mass, fullness, tenderness               Rectovaginal: Confirms               Anus:  normal sphincter tone, no lesions  Chaperone was present for exam.  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   pap smear and HR HPV obtained today Lexapro 58m daily.  Sometimes increases to 190mwith stressful work so written for BID.  #60/12 Xanax 0.54m39mrn.  #30/0RF Zofran 4mg62mn nausea with anxiety Clobetasol ointment 0.05% prn.  Pt uses one or two times monthly typically. CBC, CMP, Lipids, TSH, Vit D obtained today return annually or prn

## 2017-05-25 LAB — COMPREHENSIVE METABOLIC PANEL

## 2017-05-25 LAB — CBC

## 2017-05-25 LAB — LIPID PANEL

## 2017-05-25 LAB — VITAMIN D 25 HYDROXY (VIT D DEFICIENCY, FRACTURES)

## 2017-05-25 LAB — CYTOLOGY - PAP
ADEQUACY: ABSENT — AB
Diagnosis: UNDETERMINED — AB
HPV 16/18/45 genotyping: NEGATIVE
HPV: DETECTED — AB

## 2017-05-25 LAB — TSH

## 2017-05-27 ENCOUNTER — Telehealth: Payer: Self-pay

## 2017-05-27 DIAGNOSIS — R87610 Atypical squamous cells of undetermined significance on cytologic smear of cervix (ASC-US): Secondary | ICD-10-CM

## 2017-05-27 DIAGNOSIS — Z0000 Encounter for general adult medical examination without abnormal findings: Secondary | ICD-10-CM

## 2017-05-27 DIAGNOSIS — R87810 Cervical high risk human papillomavirus (HPV) DNA test positive: Secondary | ICD-10-CM

## 2017-05-27 NOTE — Telephone Encounter (Signed)
-----   Message from Megan Salon, MD sent at 05/26/2017  2:03 PM EST ----- Please let pt know her pap showed ASCUS pap and +HR HPV.  Needs colposcopy.  Lab work will need to be redrawn as was lost by [facility].  I am sorry about this.  She can do this when comes for the colposcopy.  Thanks.

## 2017-05-27 NOTE — Telephone Encounter (Addendum)
Spoke with patient. Results given. Patient verbalizes understanding. Has had an ablation. Colposcopy scheduled for 06/13/2017 at 10 am with Dr.Miller. Instructions given. Motrin 800 mg po x , one hour before appointment with food. Make sure to eat a meal before appointment and drink plenty of fluids. Patient agreeable and verbalized understanding of all instructions. Agreeable to return for recollection of labs from 12/20 due to lab error that occurred outside of our office. Aware no charge for this. Appointment scheduled on 05/30/2017 at 9 am. Future orders placed.  Spoke with Peter Congo from [facility] who states that HPV 16/18/45 testing was added to this patient's pap in error. This results negative. Patient will not be charged for this testing.  Routing to provider for final review. Patient agreeable to disposition. Will close encounter.

## 2017-05-30 ENCOUNTER — Other Ambulatory Visit: Payer: 59

## 2017-05-30 DIAGNOSIS — Z0000 Encounter for general adult medical examination without abnormal findings: Secondary | ICD-10-CM

## 2017-05-31 LAB — COMPREHENSIVE METABOLIC PANEL
A/G RATIO: 1.7 (ref 1.2–2.2)
ALT: 14 IU/L (ref 0–32)
AST: 16 IU/L (ref 0–40)
Albumin: 4.3 g/dL (ref 3.5–5.5)
Alkaline Phosphatase: 77 IU/L (ref 39–117)
BUN/Creatinine Ratio: 14 (ref 9–23)
BUN: 10 mg/dL (ref 6–24)
Bilirubin Total: 0.4 mg/dL (ref 0.0–1.2)
CALCIUM: 9.1 mg/dL (ref 8.7–10.2)
CO2: 23 mmol/L (ref 20–29)
CREATININE: 0.72 mg/dL (ref 0.57–1.00)
Chloride: 107 mmol/L — ABNORMAL HIGH (ref 96–106)
GFR calc Af Amer: 115 mL/min/{1.73_m2} (ref >59)
GFR, EST NON AFRICAN AMERICAN: 99 mL/min/{1.73_m2} (ref >59)
Globulin, Total: 2.6 g/dL (ref 1.5–4.5)
Glucose: 83 mg/dL (ref 65–99)
POTASSIUM: 4.5 mmol/L (ref 3.5–5.2)
Sodium: 143 mmol/L (ref 134–144)
Total Protein: 6.9 g/dL (ref 6.0–8.5)

## 2017-05-31 LAB — LIPID PANEL
CHOLESTEROL TOTAL: 173 mg/dL (ref 100–199)
Chol/HDL Ratio: 2.3 ratio (ref 0.0–4.4)
HDL: 75 mg/dL (ref >39)
LDL CALC: 89 mg/dL (ref 0–99)
TRIGLYCERIDES: 46 mg/dL (ref 0–149)
VLDL CHOLESTEROL CAL: 9 mg/dL (ref 5–40)

## 2017-05-31 LAB — CBC
HEMOGLOBIN: 12.5 g/dL (ref 11.1–15.9)
Hematocrit: 37.2 % (ref 34.0–46.6)
MCH: 31.6 pg (ref 26.6–33.0)
MCHC: 33.6 g/dL (ref 31.5–35.7)
MCV: 94 fL (ref 79–97)
Platelets: 247 10*3/uL (ref 150–379)
RBC: 3.95 x10E6/uL (ref 3.77–5.28)
RDW: 13 % (ref 12.3–15.4)
WBC: 5.6 10*3/uL (ref 3.4–10.8)

## 2017-05-31 LAB — VITAMIN D 25 HYDROXY (VIT D DEFICIENCY, FRACTURES): VIT D 25 HYDROXY: 30.1 ng/mL (ref 30.0–100.0)

## 2017-05-31 LAB — TSH: TSH: 2.28 u[IU]/mL (ref 0.450–4.500)

## 2017-06-02 ENCOUNTER — Telehealth: Payer: Self-pay | Admitting: Obstetrics & Gynecology

## 2017-06-02 ENCOUNTER — Encounter: Payer: Self-pay | Admitting: Obstetrics & Gynecology

## 2017-06-02 NOTE — Telephone Encounter (Signed)
Patient sent the following question through [place]. Forwarding to triage to follow up with patient.  ----- Message from [employer], [employer] sent at 06/02/2017 4:47 PM EST -----    Hey Dr Sabra Heck! Did my blood work ever come back? It was misplaced the first round. I came back in this past Monday and had it repeated. Just curious to know my cholesterol. Thanks!! Happy New Year!!     [name] [name]

## 2017-06-03 NOTE — Telephone Encounter (Signed)
Dr. Sabra Heck - can you review labs dated 05/30/17 and advise?

## 2017-06-03 NOTE — Telephone Encounter (Signed)
Pt notified of results via mychart message.  Ok to close encounter.

## 2017-06-09 ENCOUNTER — Telehealth: Payer: Self-pay | Admitting: Obstetrics & Gynecology

## 2017-06-09 NOTE — Telephone Encounter (Signed)
Call placed to patient to convey benefit information for scheduled procedure on 08/11/17. Left voicemail message requesting a return call

## 2017-06-09 NOTE — Telephone Encounter (Signed)
Returned call to patient. Reviewed benefit for scheduled colposcopy. Patient understood and agreeable. Patient scheduled 06/13/17 with Dr Sabra Heck. Patient aware of appointment date, arrival time and cancellation policy. No further questions. Ok to close

## 2017-06-09 NOTE — Telephone Encounter (Signed)
Patient returning call. Ok to call after 11:30 if possible.

## 2017-06-13 ENCOUNTER — Encounter: Payer: Self-pay | Admitting: Obstetrics & Gynecology

## 2017-06-13 ENCOUNTER — Other Ambulatory Visit: Payer: Self-pay

## 2017-06-13 ENCOUNTER — Ambulatory Visit (INDEPENDENT_AMBULATORY_CARE_PROVIDER_SITE_OTHER): Payer: 59 | Admitting: Obstetrics & Gynecology

## 2017-06-13 DIAGNOSIS — R87610 Atypical squamous cells of undetermined significance on cytologic smear of cervix (ASC-US): Secondary | ICD-10-CM | POA: Diagnosis not present

## 2017-06-13 DIAGNOSIS — R87810 Cervical high risk human papillomavirus (HPV) DNA test positive: Secondary | ICD-10-CM

## 2017-06-13 NOTE — Progress Notes (Signed)
47 y.o. Divorced G71P1A1 Caucasian female here for colposcopy with possible biopsies and/or ECC due to ACUS Pap with +HR HPV (neg 16/18/45--which was added on by lab and not by my orders) b obtained at [facility] 12/18.  Pt had +HR HPV 1 year ago with normal pap.  Has had new sexual partner the last few years.  She is now engaged.    No LMP recorded. Patient has had an ablation.          Sexually active: Yes.    The current method of family planning is tubal ligation.     Patient has been counseled about results and procedure.  Risks and benefits have bene reviewed including immediate and/or delayed bleeding, infection, cervical scaring from procedure, possibility of needing additional follow up as well as treatment.  rare risks of missing a lesion discussed as well.  All questions answered.  Pt ready to proceed.  BP 96/68 (BP Location: Right Arm, Patient Position: Sitting, Cuff Size: Normal)   Pulse 84   Resp 16   Ht 5' 3.75" (1.619 m)   Wt 131 lb 9.6 oz (59.7 kg)   BMI 22.77 kg/m   Physical Exam  Constitutional: She appears well-developed and well-nourished.  Genitourinary: Vagina normal. There is no rash, tenderness, lesion or injury on the right labia. There is no rash, tenderness, lesion or injury on the left labia.    Lymphadenopathy:       Right: No inguinal adenopathy present.       Left: Inguinal adenopathy present.    Speculum placed.  3% acetic acid applied to cervix for >45 seconds.  Cervix visualized with both 7.5X and 15X magnification.  Green filter also used.  Lugols solution was used.  Findings:  Decreased staining at noted in picture.  Biopsy:  12 o'clock.  ECC:  was performed.  Monsel's was needed.  Excellent hemostasis was present.  Pt tolerated procedure well and all instruments were removed.  Findings noted above on picture of cervix.  Assessment:  ASCUS pap with +HR HPV  Plan:  Pathology results will be called to patient and follow-up planned pending results.

## 2017-06-13 NOTE — Patient Instructions (Signed)

## 2018-01-10 ENCOUNTER — Other Ambulatory Visit: Payer: Self-pay | Admitting: Obstetrics & Gynecology

## 2018-01-10 NOTE — Telephone Encounter (Signed)
Medication refill request: Xanax  0.5 mg  Last AEX:  05/19/17  Next AEX: 05/26/18 Last MMG (if hormonal medication request): 11/10/16 Bi-rads Category 1 neg  Refill authorized: Please refill if appropriate.

## 2018-02-22 ENCOUNTER — Telehealth: Payer: Self-pay | Admitting: Obstetrics & Gynecology

## 2018-02-22 ENCOUNTER — Other Ambulatory Visit: Payer: Self-pay | Admitting: Obstetrics & Gynecology

## 2018-02-22 ENCOUNTER — Encounter: Payer: Self-pay | Admitting: Obstetrics & Gynecology

## 2018-02-22 DIAGNOSIS — Z1231 Encounter for screening mammogram for malignant neoplasm of breast: Secondary | ICD-10-CM

## 2018-02-22 NOTE — Telephone Encounter (Signed)
Patient sent the following message through [place]. Routing to triage to assist patient with request.   Hey Dr. Sabra Heck! I hope you are doing well! I am trying to schedule my mammogram with [facility]. I am having some pain in my left breast on one side of my nipple. I have been back to see Dr. Stephanie Coup and he is not finding any reason why the breast implants should be causing this. I spoke to [facility] today and they said I need an order from you for a diagnostic mammogram with a possible ultra sound. Can you send them the order for me? If I need to schedule with you first, just let me know.     Thank you!!!  [name] [name]

## 2018-02-22 NOTE — Telephone Encounter (Signed)
Patient returning Jill's call.  °

## 2018-02-22 NOTE — Telephone Encounter (Signed)
Spoke with patient. Reports left breast pain close to nipple. Started 3 mo ago. Bilateral breast implants Nov 2018, states he has been experiencing pain ever since placement. Returned to Dr. Stephanie Coup, no abnormal finding, proceed with MMG. Patient can not recall when she saw Dr. Stephanie Coup last. Denies lumps, nipple d/c or skin changes.   Advised OV needed for further evaluation. OV scheduled for 9/26 at 8:30am with Dr. Sabra Heck.   Routing to provider for final review. Patient is agreeable to disposition. Will close encounter.

## 2018-02-22 NOTE — Telephone Encounter (Signed)
Left message to call Jayme Mednick at [phone].  

## 2018-02-23 ENCOUNTER — Encounter: Payer: Self-pay | Admitting: Obstetrics & Gynecology

## 2018-02-23 ENCOUNTER — Ambulatory Visit: Payer: 59 | Admitting: Obstetrics & Gynecology

## 2018-02-23 ENCOUNTER — Other Ambulatory Visit: Payer: Self-pay

## 2018-02-23 VITALS — BP 102/68 | HR 72 | Resp 16 | Ht 63.75 in | Wt 132.8 lb

## 2018-02-23 DIAGNOSIS — N644 Mastodynia: Secondary | ICD-10-CM | POA: Diagnosis not present

## 2018-02-23 NOTE — Progress Notes (Signed)
Patient scheduled while in office for Dx bilateral MMG with implants and left breast US, if needed, at [facility]. Scheduled for 9/30 arriving at 1:20pm for 1:40pm appt. Patient verbalizes understanding and is agreeable.

## 2018-02-23 NOTE — Progress Notes (Signed)
GYNECOLOGY  VISIT  CC:   Left breast tenderness/pain  HPI: 47 y.o. G74P0041 Divorced White or Caucasian female here for breast pain that is focal and just on the left side.  Reports when this started, she felt it occurred after fiance fell asleep on her chest.  It was sore the next morning.  Saw Dr. Stephanie Coup as she's had bilateral implants.  No nipple discharge.  No skin changes.  Last MMG 11/10/16.  Has taken Advil and Aleve but the pain comes right back.  Had implants placed last November.  GYNECOLOGIC HISTORY: No LMP recorded. Patient has had an ablation. Contraception: ablation Menopausal hormone therapy: none  Patient Active Problem List   Diagnosis Date Noted  . Seizures ([place]) 05/18/2013  . Seizure ([place]) 05/17/2013  . Abdominal  pain- lower 01/03/2013  . Nausea alone 01/03/2013  . Slow transit constipation 08/12/2011  . Rash and nonspecific skin eruption 08/12/2011  . Seasonal allergic rhinitis 08/12/2011  . Neck pain 05/21/2011  . General medical examination 02/19/2011  . Tobacco use disorder 01/10/2011  . HEMORRHOIDS 05/07/2010  . DEPRESSION 04/17/2010  . HEMATURIA UNSPECIFIED 04/17/2010  . EARLY SATIETY 04/17/2010  . GAS/BLOATING 04/17/2010    Past Medical History:  Diagnosis Date  . Anxiety   . Depression   . GERD (gastroesophageal reflux disease)   . IBS (irritable bowel syndrome)     Past Surgical History:  Procedure Laterality Date  . BILATERAL SALPINGECTOMY Bilateral 10/31/2012   Procedure: BILATERAL SALPINGECTOMY;  Surgeon: Lyman Speller, MD;  Location: [facility];  Service: Gynecology;  Laterality: Bilateral;  . BREAST ENHANCEMENT SURGERY  04/12/2017  . CESAREAN SECTION    . CYSTO WITH HYDRODISTENSION N/A 10/31/2012   Procedure: CYSTOSCOPY/HYDRODISTENSION;  Surgeon: Reece Packer, MD;  Location: [facility];  Service: Urology;  Laterality: N/A;  . DILATION AND CURETTAGE OF UTERUS    . DILITATION & CURRETTAGE/HYSTROSCOPY WITH NOVASURE ABLATION N/A 10/31/2012   Procedure: DILATATION & CURETTAGE/HYSTEROSCOPY WITH NOVASURE ABLATION;  Surgeon: Lyman Speller, MD;  Location: [facility];  Service: Gynecology;  Laterality: N/A;  Dr Suzanne Boron Diarmid to start case with a cysto at beginning of case while patient under anesthesia.  . ENDOMETRIAL BIOPSY  09/19/12   neg  . LAPAROSCOPY Bilateral 10/31/2012   Procedure: LAPAROSCOPY OPERATIVE;  Surgeon: Lyman Speller, MD;  Location: [facility];  Service: Gynecology;  Laterality: Bilateral;    MEDS:   Current Outpatient Medications on File Prior to Visit  Medication Sig Dispense Refill  . acetaminophen (TYLENOL) 325 MG tablet Take 650 mg by mouth every 6 (six) hours as needed for mild pain.    Marland Kitchen ALPRAZolam (XANAX) 0.5 MG tablet TAKE 1 TABLET BY MOUTH EVERY DAY AS NEEDED FOR ANXIETY 30 tablet 0  . clobetasol ointment (TEMOVATE) 4.00 % Apply 1 application topically 2 (two) times daily. Apply as directed twice daily for up to 7 days. 60 g 2  . escitalopram (LEXAPRO) 5 MG tablet Take 1 tablet (5 mg total) by mouth 2 (two) times daily. 60 tablet 12  . ibuprofen (ADVIL,MOTRIN) 200 MG tablet Take 200 mg by mouth every 6 (six) hours as needed for moderate pain.    . naproxen sodium (ANAPROX) 220 MG tablet Take 220 mg by mouth 2 (two) times daily as needed (pain).    . ondansetron (ZOFRAN) 4 MG tablet Take 1 tablet (4 mg total) by mouth every 8 (eight) hours as needed for nausea or vomiting. 30 tablet 0   No current facility-administered medications  on file prior to visit.     ALLERGIES: Ciprofloxacin  Family History  Problem Relation Age of Onset  . Stroke Father   . Non-Hodgkin's lymphoma Father   . Hypertension Father   . Kidney disease Father   . Hypertension Mother   . Club foot Son     SH:  Divorced but now engaged, non smoker  Review of Systems  Genitourinary:       Breast pain   All other systems reviewed and are negative.   PHYSICAL EXAMINATION:    BP 102/68 (BP Location: Right Arm, Patient Position:  Sitting, Cuff Size: Normal)   Pulse 72   Resp 16   Ht 5' 3.75" (1.619 m)   Wt 132 lb 12.8 oz (60.2 kg)   BMI 22.97 kg/m     Physical Exam  Constitutional: She appears well-developed and well-nourished.  Neck: No thyromegaly present.  Respiratory: Right breast exhibits no inverted nipple, no mass, no nipple discharge, no skin change and no tenderness. Left breast exhibits tenderness. Left breast exhibits no inverted nipple, no mass, no nipple discharge and no skin change. Breasts are symmetrical.    Bilateral implants present  Lymphadenopathy:    She has no cervical adenopathy.    She has no axillary adenopathy.       Right: No supraclavicular adenopathy present.       Left: No supraclavicular adenopathy present.    Assessment: Discrete breast tenderness on the left without discrete mass Implants placed 11/18  Plan: Diagnostic MMG on the left will be ordered with ultrasound.  Possibly she has a cyst in this breast.  Will await results.   ~15 minutes spent with patient >50% of time was in face to face discussion of above.

## 2018-02-27 ENCOUNTER — Ambulatory Visit
Admission: RE | Admit: 2018-02-27 | Discharge: 2018-02-27 | Disposition: A | Discharge status: Home / Self Care | Payer: 59 | Source: Ambulatory Visit | Attending: Obstetrics & Gynecology | Admitting: Obstetrics & Gynecology

## 2018-02-27 DIAGNOSIS — N644 Mastodynia: Secondary | ICD-10-CM

## 2018-03-01 ENCOUNTER — Telehealth: Payer: Self-pay | Admitting: Emergency Medicine

## 2018-03-01 NOTE — Telephone Encounter (Signed)
-----   Message from Megan Salon, MD sent at 02/28/2018  6:07 AM EDT ----- Could you please call pt and just let her know I got the MMG report as well.  There is a small complicated cyst at the location of her pain (at this that is what the report looks like).  I do not think she needs repeat imaging until her routine is due.  Does she have any questions about the result.  Ok to remove from imaging hold.

## 2018-03-01 NOTE — Telephone Encounter (Signed)
Spoke with patient and message from Dr. Sabra Heck given.  No questions, she is feeling well.  She will call back with any questions.  Encounter closed.  Out of hold.

## 2018-05-25 NOTE — Progress Notes (Signed)
47 y.o. G95P0041 Divorced White or Caucasian female here for annual exam.  She's going to get married this next year.  Thinking about retirement as he will retire this year.    Denies vaginal bleeding.    Continues to have nausea.    No LMP recorded. Patient has had an ablation.          Sexually active: Yes.    The current method of family planning is Ablation.    Exercising: Yes.    Walking weight Smoker:  no  Health Maintenance: Pap:05/25/17 Ascus + HR HPV  02/17/16 neg. HR HPV:+detected. #16, 18/45 Neg. History of abnormal Pap:  yes MMG:  02/27/18 Bi-rads 2 Benign density C Colonoscopy:  01/01/13 follow up in 5 years  BMD:   never TDaP:  01/2016 Pneumonia vaccine(s):  NA Shingrix:   NA Hep C testing: NA Screening Labs: normal 2018 Urine today: no   reports that she quit smoking about 5 years ago. She has never used smokeless tobacco. She reports current alcohol use of about 5.0 standard drinks of alcohol per week. She reports that she does not use drugs.  Past Medical History:  Diagnosis Date  . Anxiety   . Depression   . GERD (gastroesophageal reflux disease)   . IBS (irritable bowel syndrome)     Past Surgical History:  Procedure Laterality Date  . AUGMENTATION MAMMAPLASTY Bilateral 03/2017  . BILATERAL SALPINGECTOMY Bilateral 10/31/2012   Procedure: BILATERAL SALPINGECTOMY;  Surgeon: Lyman Speller, MD;  Location: [facility];  Service: Gynecology;  Laterality: Bilateral;  . BREAST ENHANCEMENT SURGERY  04/12/2017  . CESAREAN SECTION    . CYSTO WITH HYDRODISTENSION N/A 10/31/2012   Procedure: CYSTOSCOPY/HYDRODISTENSION;  Surgeon: Reece Packer, MD;  Location: [facility];  Service: Urology;  Laterality: N/A;  . DILATION AND CURETTAGE OF UTERUS    . DILITATION & CURRETTAGE/HYSTROSCOPY WITH NOVASURE ABLATION N/A 10/31/2012   Procedure: DILATATION & CURETTAGE/HYSTEROSCOPY WITH NOVASURE ABLATION;  Surgeon: Lyman Speller, MD;  Location: [facility];  Service: Gynecology;   Laterality: N/A;  Dr Suzanne Boron Diarmid to start case with a cysto at beginning of case while patient under anesthesia.  . ENDOMETRIAL BIOPSY  09/19/12   neg  . LAPAROSCOPY Bilateral 10/31/2012   Procedure: LAPAROSCOPY OPERATIVE;  Surgeon: Lyman Speller, MD;  Location: [facility];  Service: Gynecology;  Laterality: Bilateral;    Current Outpatient Medications  Medication Sig Dispense Refill  . acetaminophen (TYLENOL) 325 MG tablet Take 650 mg by mouth every 6 (six) hours as needed for mild pain.    Marland Kitchen ALPRAZolam (XANAX) 0.5 MG tablet TAKE 1 TABLET BY MOUTH EVERY DAY AS NEEDED FOR ANXIETY 30 tablet 0  . clobetasol ointment (TEMOVATE) 6.62 % Apply 1 application topically 2 (two) times daily. Apply as directed twice daily for up to 7 days. 60 g 2  . escitalopram (LEXAPRO) 5 MG tablet Take 1 tablet (5 mg total) by mouth 2 (two) times daily. 60 tablet 12  . ibuprofen (ADVIL,MOTRIN) 200 MG tablet Take 200 mg by mouth every 6 (six) hours as needed for moderate pain.    . naproxen sodium (ANAPROX) 220 MG tablet Take 220 mg by mouth 2 (two) times daily as needed (pain).    . ondansetron (ZOFRAN) 4 MG tablet Take 1 tablet (4 mg total) by mouth every 8 (eight) hours as needed for nausea or vomiting. 30 tablet 0   No current facility-administered medications for this visit.     Family History  Problem  Relation Age of Onset  . Stroke Father   . Non-Hodgkin's lymphoma Father   . Hypertension Father   . Kidney disease Father   . Hypertension Mother   . Club foot Son     Review of Systems  Gastrointestinal: Positive for nausea.    Exam:   Vitals:   05/26/18 1029  BP: 100/60  Pulse: 66  Resp: 14    General appearance: alert, cooperative and appears stated age Head: Normocephalic, without obvious abnormality, atraumatic Neck: no adenopathy, supple, symmetrical, trachea midline and thyroid normal to inspection and palpation Lungs: clear to auscultation bilaterally Breasts: normal appearance, no  masses or tenderness Heart: regular rate and rhythm Abdomen: soft, non-tender; bowel sounds normal; no masses,  no organomegaly Extremities: extremities normal, atraumatic, no cyanosis or edema Skin: Skin color, texture, turgor normal. No rashes or lesions Lymph nodes: Cervical, supraclavicular, and axillary nodes normal. No abnormal inguinal nodes palpated Neurologic: Grossly normal   Pelvic: External genitalia:  no lesions              Urethra:  normal appearing urethra with no masses, tenderness or lesions              Bartholins and Skenes: normal                 Vagina: normal appearing vagina with normal color and discharge, no lesions              Cervix: no lesions              Pap taken: Yes.   Bimanual Exam:  Uterus:  normal size, contour, position, consistency, mobility, non-tender              Adnexa: normal adnexa and no mass, fullness, tenderness               Rectovaginal: Confirms               Anus:  normal sphincter tone, no lesions  Chaperone was present for exam.  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  #30/0RF No lab work needed today Pt aware colonoscopy needed. Does not want referral. return annually or prn

## 2018-05-26 ENCOUNTER — Other Ambulatory Visit (HOSPITAL_COMMUNITY)
Admission: RE | Admit: 2018-05-26 | Discharge: 2018-05-26 | Disposition: A | Discharge status: Home / Self Care | Payer: 59 | Source: Ambulatory Visit | Attending: Obstetrics & Gynecology | Admitting: Obstetrics & Gynecology

## 2018-05-26 ENCOUNTER — Encounter: Payer: Self-pay | Admitting: Obstetrics & Gynecology

## 2018-05-26 ENCOUNTER — Ambulatory Visit (INDEPENDENT_AMBULATORY_CARE_PROVIDER_SITE_OTHER): Payer: 59 | Admitting: Obstetrics & Gynecology

## 2018-05-26 VITALS — BP 100/60 | HR 66 | Resp 14 | Ht 64.0 in | Wt 134.0 lb

## 2018-05-26 DIAGNOSIS — Z01419 Encounter for gynecological examination (general) (routine) without abnormal findings: Secondary | ICD-10-CM | POA: Diagnosis not present

## 2018-05-26 DIAGNOSIS — Z124 Encounter for screening for malignant neoplasm of cervix: Secondary | ICD-10-CM | POA: Diagnosis present

## 2018-05-26 DIAGNOSIS — B977 Papillomavirus as the cause of diseases classified elsewhere: Secondary | ICD-10-CM | POA: Diagnosis present

## 2018-05-26 MED ORDER — ALPRAZOLAM 0.5 MG PO TABS: ORAL_TABLET | ORAL | 0 refills | Status: DC

## 2018-05-26 MED ORDER — ESCITALOPRAM OXALATE 5 MG PO TABS: 5.0000 mg | ORAL_TABLET | Freq: Two times a day (BID) | ORAL | 12 refills | Status: DC

## 2018-05-26 NOTE — Patient Instructions (Signed)
Call Dr. Collene Mares to schedule your colonoscopy

## 2018-06-01 LAB — CYTOLOGY - PAP
ADEQUACY: ABSENT
DIAGNOSIS: NEGATIVE
HPV: NOT DETECTED

## 2018-06-15 ENCOUNTER — Ambulatory Visit: Payer: 59 | Admitting: Obstetrics & Gynecology

## 2018-07-20 ENCOUNTER — Encounter: Payer: Self-pay | Admitting: Family Medicine

## 2018-07-28 ENCOUNTER — Ambulatory Visit: Payer: 59 | Admitting: Obstetrics & Gynecology

## 2018-08-01 ENCOUNTER — Encounter: Payer: Self-pay | Admitting: Family Medicine

## 2018-08-01 ENCOUNTER — Ambulatory Visit (INDEPENDENT_AMBULATORY_CARE_PROVIDER_SITE_OTHER): Payer: 59

## 2018-08-01 ENCOUNTER — Ambulatory Visit: Payer: 59 | Admitting: Family Medicine

## 2018-08-01 VITALS — BP 122/64 | HR 72 | Temp 98.1°F | Ht 64.0 in | Wt 134.0 lb

## 2018-08-01 DIAGNOSIS — R002 Palpitations: Secondary | ICD-10-CM | POA: Diagnosis not present

## 2018-08-01 DIAGNOSIS — F419 Anxiety disorder, unspecified: Secondary | ICD-10-CM | POA: Insufficient documentation

## 2018-08-01 DIAGNOSIS — M899 Disorder of bone, unspecified: Secondary | ICD-10-CM | POA: Diagnosis not present

## 2018-08-01 DIAGNOSIS — R42 Dizziness and giddiness: Secondary | ICD-10-CM

## 2018-08-01 DIAGNOSIS — F411 Generalized anxiety disorder: Secondary | ICD-10-CM | POA: Insufficient documentation

## 2018-08-01 DIAGNOSIS — F418 Other specified anxiety disorders: Secondary | ICD-10-CM | POA: Diagnosis not present

## 2018-08-01 MED ORDER — CYCLOBENZAPRINE HCL 5 MG PO TABS: 5.0000 mg | ORAL_TABLET | Freq: Three times a day (TID) | ORAL | 1 refills | Status: DC | PRN

## 2018-08-01 MED ORDER — SERTRALINE HCL 50 MG PO TABS: 50.0000 mg | ORAL_TABLET | Freq: Every day | ORAL | 3 refills | Status: DC

## 2018-08-01 MED ORDER — ALPRAZOLAM 0.25 MG PO TABS: 0.2500 mg | ORAL_TABLET | Freq: Two times a day (BID) | ORAL | 0 refills | Status: DC | PRN

## 2018-08-01 MED ORDER — FLUOXETINE HCL 20 MG PO TABS: 20.0000 mg | ORAL_TABLET | Freq: Every day | ORAL | 3 refills | Status: DC

## 2018-08-01 NOTE — Progress Notes (Addendum)
Patient: ZO LOUDON MRN: [id number] DOB: 1970/10/09 PCP: Orma Flaming, MD     Subjective:  Chief Complaint  Patient presents with  . Establish Care  . episodes of dizziness  . chest flutters    HPI: The patient is a 48 y.o. female who presents today to establish care. She has a few issues to discuss today. Her main complaints are dizziness and palpitations.   Palpitations: started about 5-6 weeks ago. She would wake up in the middle of the night feeling weird. Her heart rate would be 145 (per apple watch), she would feel strange, hands/feet would get cold then she would be burning up. She gets these episodes during day as well. It's caused her to have anxiety to go into public. She is unsure if anxiety related. She has this daily. If she doesn't take a xanax she feels unstable. Her palpitations feel irregular and makes her more nervous. She feels like it's skipping, not so much racing. She has no shortness of breath with it. No headaches. No pain down her left arm or up her jaw. Episodes last until she takes a xanax. Dad had CAD/smoking/bypass. She does not have have diabetes nor cholesterol issues. She has smoked for years and went to vaping about 5 years ago, but stopped vaping cold [place] 2 weeks ago. Stress test in 2018 and it was normal   Dizziness: started about 5-6 weeks ago. No dizziness with head turning, may make it worse. She feels like she is spinning and the world is standing still. Lasts until she takes a xanax. Goes along with the palpitations. No ringing in her ears, no vision changes, no N/V.   [facility] TSH, cbc, cmp, vitamin D all normal   Anxiety/depression: Currenty on lexapro: on 5mg  only. She has been on for a long time. Doesn't really seem to help her. Has been on for depression, not really for a diagnosed anxiety. Has been getting her xanax from Dr. Sabra Heck and rarely needs. States a whole bottle used to last a year, but since all  this started 5 weeks ago she is needing a xanax just to leave the house or even function. Requesting a .25mg  refill as she doesn't cut the .5mg  like she would like. +panic attacks. On her phq9 score she did check she has si more than half the days, but when I asked her about this she states she would never do anything and never had these thoughts until 5-6 weeks ago when these episodes started. She has no past history of si/hi.   Scapula pain: left sided. seems worse at night. Tight. Has tried heating pad. No pain with deep inspiration. Worried about lung cancer. No pain with left shoulder/arm movement.   Review of Systems  Constitutional: Positive for fatigue. Negative for fever and unexpected weight change.  HENT: Negative for ear discharge, ear pain and tinnitus.   Eyes: Negative for photophobia and visual disturbance.       Right eye twitching   Respiratory: Negative for cough and shortness of breath.   Cardiovascular: Positive for palpitations. Negative for chest pain and leg swelling.  Gastrointestinal: Negative for abdominal pain and nausea.  Musculoskeletal: Positive for back pain. Negative for neck pain.       C/o left sided mid back pain  Skin: Negative for rash.       C/o feeling very itchy all over  Neurological: Positive for dizziness and headaches.  Psychiatric/Behavioral: Positive for sleep disturbance. Negative  for dysphoric mood and suicidal ideas. The patient is nervous/anxious.     Allergies Patient is allergic to ciprofloxacin.  Past Medical History Patient  has a past medical history of Anxiety, Depression, GERD (gastroesophageal reflux disease), and IBS (irritable bowel syndrome).  Surgical History Patient  has a past surgical history that includes Cesarean section; Dilation and curettage of uterus; Endometrial biopsy (09/19/12); Dilatation & currettage/hysteroscopy with novasure ablation (N/A, 10/31/2012); laparoscopy (Bilateral, 10/31/2012); Bilateral salpingectomy  (Bilateral, 10/31/2012); cysto with hydrodistension (N/A, 10/31/2012); Breast enhancement surgery (04/12/2017); and Augmentation mammaplasty (Bilateral, 03/2017).  Family History Pateint's family history includes Club foot in her son; Hypertension in her father and mother; Kidney disease in her father; Non-Hodgkin's lymphoma in her father; Stroke in her father.  Social History Patient  reports that she quit smoking about 5 years ago. Her smoking use included cigarettes. She has never used smokeless tobacco. She reports current alcohol use of about 5.0 standard drinks of alcohol per week. She reports that she does not use drugs.    Objective: Vitals:   08/04/18 1044  BP: 122/64  Pulse: 72  Temp: 98.1 F (36.7 C)  Weight: 134 lb (60.8 kg)  Height: 5\' 4"  (1.626 m)    Body mass index is 23 kg/m.     Physical Exam Vitals signs reviewed.  Constitutional:      Appearance: Normal appearance.  HENT:     Head: Normocephalic and atraumatic.     Right Ear: Tympanic membrane, ear canal and external ear normal.     Left Ear: Tympanic membrane, ear canal and external ear normal.     Nose: Nose normal.  Eyes:     Extraocular Movements: Extraocular movements intact.     Pupils: Pupils are equal, round, and reactive to light.  Neck:     Musculoskeletal: Normal range of motion and neck supple.  Cardiovascular:     Rate and Rhythm: Normal rate and regular rhythm.     Heart sounds: Normal heart sounds.  Pulmonary:     Effort: Pulmonary effort is normal.     Breath sounds: Normal breath sounds.  Abdominal:     General: Abdomen is flat. Bowel sounds are normal.     Palpations: Abdomen is soft.  Skin:    General: Skin is warm.     Capillary Refill: Capillary refill takes less than 2 seconds.  Neurological:     General: No focal deficit present.     Mental Status: She is alert and oriented to person, place, and time.     Comments: Negative dix halpike bilaterally   Psychiatric:        Mood  and Affect: Mood normal.        Behavior: Behavior normal.   ekg: sinus bradycardia with rate of 54   CXR: no acute process       Office Visit from 08/01/2018 in [place]  PHQ-9 Total Score  25     GAD 7 : Generalized Anxiety Score 08/01/2018  Nervous, Anxious, on Edge 3  Control/stop worrying 3  Worry too much - different things 3  Trouble relaxing 3  Restless 2  Easily annoyed or irritable 2  Afraid - awful might happen 3  Total GAD 7 Score 19  Anxiety Difficulty Extremely difficult     Assessment/plan: 1. Depression with anxiety  Changing her from lexapro to prozac. GAD7 and phq9 score are severe. We are going to titrate her up if needed and continue the xanax at this  time, but will be trying to get her off this or transition her over to klonopin once we have symptoms better controlled. I think counseling would benefit her as well. Discussed exercise/yoga. Precautions given. Discussed ssri and increased suicide risk. If this happens she is to go to ER or call 911. She has no plan and no history. Will have a close f/u in one month or sooner if needed. Wrote all instructions down for her. Also checked pmp and has not filled xanax in over a year.   -needs drug contract at next visit.   2. Palpitations Labs just checked and wnl. Checking echo and attempted to get zio patch to make sure no arrhythmias, but patient refused. She states it is too expensive and she would rather start with just the echo. Again, I would prefer we do the zio, but she does not want to do this right now. Will get echo and go from there and see if treating anxiety also helps this.  - EKG 12-Lead - ECHOCARDIOGRAM COMPLETE; Future  3. Dizziness Does not have vertigo, symptoms go with her panic attacks/episodes. Resolved with xanax. Will try to treat anxiety and see if this resolves as well as work up her heart.   4. Scapular dysfunction Appears to be msk in nature. Will do trial of  muscle relaxer, nsaids, massage, stretching and heating pad. I think her posture could be contributing with her doing long hours for tax season as well as being tense since she is so anxious. Discussed I do not want her taking the muscle relaxer with her bzd. F/u in one month. May benefit from PT if this fails.  - DG Chest 2 View; Future  >45 minutes spent in face to face contact, work up and coordinating care.   Return in about 1 month (around 09/01/2018).   Orma Flaming, MD [place]   08/04/2018

## 2018-08-01 NOTE — Patient Instructions (Addendum)
For your anxiety: we are going to stop the lexapro and start you on zoloft. Will take this everyday in the AM. Takes about 3-4 weeks to get into system. Will see you back closely in one month for follow up. Also sent in .25mg  of xanax for you.   For your scapular pain: want you to do keep doing heating pad and exercises and would like you to work on posture. Go get massage and yoga and exercise are helpful.   See you back in one month.  -for your heart: echo and look into zio patch monitor x 7 days.

## 2018-08-04 ENCOUNTER — Ambulatory Visit (HOSPITAL_COMMUNITY): Payer: 59 | Attending: Internal Medicine

## 2018-08-04 ENCOUNTER — Encounter: Payer: Self-pay | Admitting: Family Medicine

## 2018-08-04 DIAGNOSIS — R002 Palpitations: Secondary | ICD-10-CM | POA: Diagnosis not present

## 2018-08-04 LAB — ECHOCARDIOGRAM COMPLETE
Height: 64 in
Weight: 2144 oz

## 2018-08-07 ENCOUNTER — Encounter: Payer: Self-pay | Admitting: Family Medicine

## 2018-08-31 ENCOUNTER — Telehealth: Payer: Self-pay

## 2018-08-31 NOTE — Telephone Encounter (Signed)
Left detailed message on patient's cell phone in regards to making her 4/3 appt a webex appt.  Requested a call back from patient.

## 2018-09-01 ENCOUNTER — Encounter: Payer: Self-pay | Admitting: Family Medicine

## 2018-09-01 ENCOUNTER — Ambulatory Visit (INDEPENDENT_AMBULATORY_CARE_PROVIDER_SITE_OTHER): Payer: 59 | Admitting: Family Medicine

## 2018-09-01 DIAGNOSIS — F411 Generalized anxiety disorder: Secondary | ICD-10-CM

## 2018-09-01 DIAGNOSIS — F339 Major depressive disorder, recurrent, unspecified: Secondary | ICD-10-CM

## 2018-09-01 MED ORDER — BUSPIRONE HCL 7.5 MG PO TABS: 7.5000 mg | ORAL_TABLET | Freq: Three times a day (TID) | ORAL | 1 refills | Status: DC

## 2018-09-01 MED ORDER — SERTRALINE HCL 50 MG PO TABS: 25.0000 mg | ORAL_TABLET | Freq: Every day | ORAL | 3 refills | Status: DC

## 2018-09-01 MED ORDER — CLONAZEPAM 0.5 MG PO TABS: 0.5000 mg | ORAL_TABLET | Freq: Two times a day (BID) | ORAL | 1 refills | Status: DC | PRN

## 2018-09-01 NOTE — Progress Notes (Signed)
Patient: [name] [name] MRN: [id number] DOB: Jun 30, 1970 PCP: Orma Flaming, MD     I connected with Trianna Lupien Cruthis on 09/01/18 at 9:55am by a video enabled telemedicine application and verified that I am speaking with the correct person using two identifiers.  Location patient: Home Location provider: [facility], Office Persons participating in this virtual visit: Alga Cruthis and Dr. Rogers Blocker   I discussed the limitations of evaluation and management by telemedicine and the availability of in person appointments. The patient expressed understanding and agreed to proceed.   Subjective:  Chief Complaint  Patient presents with  . Anxiety  . Depression    HPI: The patient is a 48 y.o. female who presents today for follow up of anxiety and depression. I saw her a month ago and she had multiple complaints of dizziness, palpitations, feeling off and just generalized panic/anxiety. I wanted to change her to prozac, but she didn't do this because has tried in the past apparently. She was not able to tolerate 50mg  of zoloft and is currently on 25mg  daily. Her depression has much improved, but her anxiety is about the same. She is relying on xanax and taking on a regular basis now just to get through her day. She states if she doesn't take a xanax she can not eat lunch. 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   Review of Systems  Constitutional: Negative for fatigue and fever.  Respiratory: Negative for cough and shortness of breath.   Cardiovascular: Negative for chest pain and palpitations.  Gastrointestinal: Negative for abdominal pain, diarrhea, nausea and vomiting.  Musculoskeletal: Positive for back pain.  Psychiatric/Behavioral: Negative for suicidal ideas. The patient is nervous/anxious.     Allergies Patient is allergic to ciprofloxacin.  Past  Medical History Patient  has a past medical history of Anxiety, Depression, GERD (gastroesophageal reflux disease), and IBS (irritable bowel syndrome).  Surgical History Patient  has a past surgical history that includes Cesarean section; Dilation and curettage of uterus; Endometrial biopsy (09/19/12); Dilatation & currettage/hysteroscopy with novasure ablation (N/A, 10/31/2012); laparoscopy (Bilateral, 10/31/2012); Bilateral salpingectomy (Bilateral, 10/31/2012); cysto with hydrodistension (N/A, 10/31/2012); Breast enhancement surgery (04/12/2017); and Augmentation mammaplasty (Bilateral, 03/2017).  Family History Pateint's family history includes Club foot in her son; Hypertension in her father and mother; Kidney disease in her father; Non-Hodgkin's lymphoma in her father; Stroke in her father.  Social History Patient  reports that she quit smoking about 5 years ago. Her smoking use included cigarettes. She has never used smokeless tobacco. She reports current alcohol use of about 5.0 standard drinks of alcohol per week. She reports that she does not use drugs.    Objective: There were no vitals filed for this visit.  There is no height or weight on file to calculate BMI.  Physical Exam Constitutional:      Appearance: Normal appearance.  HENT:     Head: Normocephalic and atraumatic.  Eyes:     Extraocular Movements: Extraocular movements intact.  Pulmonary:     Effort: Pulmonary effort is normal.  Neurological:     General: No focal deficit present.     Mental Status: She is alert and oriented to person, place, and time.     Comments: No si/hi/ah/vh   Psychiatric:        Mood and Affect: Mood normal.  Behavior: Behavior normal.          Office Visit from 09/01/2018 in [place]  PHQ-9 Total Score  11     GAD 7 : Generalized Anxiety Score 09/01/2018 08/01/2018  Nervous, Anxious, on Edge 3 3  Control/stop worrying 3 3  Worry too much - different things 3  3  Trouble relaxing 3 3  Restless 2 2  Easily annoyed or irritable 3 2  Afraid - awful might happen 2 3  Total GAD 7 Score 19 19  Anxiety Difficulty Somewhat difficult Extremely difficult     Assessment/plan: 1. GAD (generalized anxiety disorder) Anxiety is no better and GAD7 score is the same. She can not tolerate much of the zoloft and would prefer to stay on this at this time. Discussed she is using xanax way too much so Im going to change her to klonopin to give her a little bit longer effect of medication and safer. Discussed can not stop this cold [place]. Could be having increased anxiety due to needing another xanax. Also going to add on buspar bid-tid to help anxiety. Adverse reactions of this drug discussed. If still not better at f/u want to stop the zoloft and add a different agent. Let me know if any issues, otherwise f/u in one month.   2. Depression, recurrent (HCC) phq9 score significantly improved. Will continue zoloft for now, but if anxiety not better controlled will change medication. F/u in one month or sooner if needed.     Return in about 1 month (around 10/01/2018) for anxiety .    Orma Flaming, MD [place]  09/01/2018

## 2018-11-02 ENCOUNTER — Other Ambulatory Visit: Payer: Self-pay | Admitting: Family Medicine

## 2018-11-02 ENCOUNTER — Encounter: Payer: Self-pay | Admitting: Family Medicine

## 2018-11-02 NOTE — Telephone Encounter (Signed)
Last OV: 09/01/18 Next OV: none scheduled at this time Last filled: 10/06/18

## 2018-12-18 DIAGNOSIS — Z9889 Other specified postprocedural states: Secondary | ICD-10-CM

## 2018-12-18 HISTORY — PX: BACK SURGERY: SHX140

## 2018-12-18 HISTORY — DX: Other specified postprocedural states: Z98.890

## 2019-02-13 ENCOUNTER — Other Ambulatory Visit: Payer: Self-pay | Admitting: Family Medicine

## 2019-02-19 ENCOUNTER — Other Ambulatory Visit: Payer: Self-pay

## 2019-02-19 ENCOUNTER — Ambulatory Visit: Payer: 59 | Admitting: Family Medicine

## 2019-02-19 ENCOUNTER — Encounter: Payer: Self-pay | Admitting: Family Medicine

## 2019-02-19 VITALS — BP 104/78 | HR 78 | Temp 98.2°F | Ht 64.0 in | Wt 135.0 lb

## 2019-02-19 DIAGNOSIS — Z23 Encounter for immunization: Secondary | ICD-10-CM | POA: Diagnosis not present

## 2019-02-19 DIAGNOSIS — F411 Generalized anxiety disorder: Secondary | ICD-10-CM

## 2019-02-19 DIAGNOSIS — Z780 Asymptomatic menopausal state: Secondary | ICD-10-CM | POA: Diagnosis not present

## 2019-02-19 MED ORDER — GABAPENTIN 300 MG PO CAPS: 300.0000 mg | ORAL_CAPSULE | Freq: Three times a day (TID) | ORAL | 3 refills | Status: DC

## 2019-02-19 MED ORDER — CLONAZEPAM 0.5 MG PO TABS: 0.5000 mg | ORAL_TABLET | Freq: Two times a day (BID) | ORAL | 1 refills | Status: DC

## 2019-02-19 NOTE — Progress Notes (Signed)
Patient: [name] [name] MRN: [id number] DOB: [birth year] PCP: Orma Flaming, MD     Subjective:  Chief Complaint  Patient presents with  . Anxiety    HPI: The patient is a 48 y.o. female who presents today for anxiety follow up. She is currently on 25mg  of zoloft and klonopin. She was given buspar, but is not taking this. She takes a klonopin in the AM and is good. She does not like the zoloft at all and really wants to stop taking this. It has ruined her sex drive. Her dizziness and palpitations have resolved. She is still anxious and feels really tired. When she takes the klonopin she feels normal and is doing much better than when I last saw her. 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 She is also going through menopause and is not sure how much of this is contributing as well. No hot flashes. She is trying to exercise.   Review of Systems  Constitutional: Positive for fatigue. Negative for chills and fever.  HENT: Negative for congestion, postnasal drip, rhinorrhea and sore throat.   Eyes: Negative for visual disturbance.  Respiratory: Negative for cough, chest tightness and shortness of breath.   Cardiovascular: Negative for chest pain, palpitations and leg swelling.  Gastrointestinal: Negative for abdominal pain, diarrhea, nausea and vomiting.  Musculoskeletal: Positive for neck pain. Negative for back pain.  Neurological: Negative for dizziness and headaches.  Psychiatric/Behavioral: Positive for sleep disturbance.    Allergies Patient is allergic to ciprofloxacin.  Past Medical History Patient  has a past medical history of Anxiety, Depression, GERD (gastroesophageal reflux disease), History of back surgery (12/18/2018), and IBS (irritable bowel syndrome).  Surgical History Patient  has a past surgical history that includes Cesarean  section; Dilation and curettage of uterus; Endometrial biopsy (09/19/12); Dilatation & currettage/hysteroscopy with novasure ablation (N/A, 10/31/2012); laparoscopy (Bilateral, 10/31/2012); Bilateral salpingectomy (Bilateral, 10/31/2012); cysto with hydrodistension (N/A, 10/31/2012); Breast enhancement surgery (04/12/2017); Augmentation mammaplasty (Bilateral, 03/2017); and Back surgery (Bilateral, 12/18/2018).  Family History Pateint's family history includes Club foot in her son; Hypertension in her father and mother; Kidney disease in her father; Non-Hodgkin's lymphoma in her father; Stroke in her father.  Social History Patient  reports that she quit smoking about 6 years ago. Her smoking use included cigarettes. She has never used smokeless tobacco. She reports current alcohol use of about 5.0 standard drinks of alcohol per week. She reports that she does not use drugs.    Objective: Vitals:   02/19/19 1105  BP: 104/78  Pulse: 78  Temp: 98.2 F (36.8 C)  TempSrc: Skin  SpO2: 99%  Weight: 135 lb (61.2 kg)  Height: 5\' 4"  (1.626 m)    Body mass index is 23.17 kg/m.  Physical Exam Vitals signs reviewed.  Constitutional:      Appearance: Normal appearance. She is normal weight.  HENT:     Head: Normocephalic and atraumatic.     Right Ear: Tympanic membrane, ear canal and external ear normal.     Left Ear: Tympanic membrane, ear canal and external ear normal.     Nose: Nose normal.     Mouth/Throat:     Mouth: Mucous membranes are moist.  Eyes:     Extraocular Movements: Extraocular movements intact.     Conjunctiva/sclera: Conjunctivae normal.     Pupils: Pupils are equal,  round, and reactive to light.  Neck:     Musculoskeletal: Normal range of motion and neck supple.  Cardiovascular:     Rate and Rhythm: Normal rate and regular rhythm.     Pulses: Normal pulses.     Heart sounds: Normal heart sounds.  Pulmonary:     Effort: Pulmonary effort is normal.  Abdominal:     General:  Abdomen is flat. Bowel sounds are normal.     Palpations: Abdomen is soft.  Skin:    General: Skin is warm and dry.     Capillary Refill: Capillary refill takes less than 2 seconds.  Neurological:     General: No focal deficit present.     Mental Status: She is alert and oriented to person, place, and time.  Psychiatric:        Mood and Affect: Mood normal.        Behavior: Behavior normal.        Depression screen [facility] 2/9 02/19/2019 09/01/2018 08/01/2018  Decreased Interest 0 2 3  Down, Depressed, Hopeless 0 1 2  PHQ - 2 Score 0 3 5  Altered sleeping 3 3 3   Tired, decreased energy 3 2 3   Change in appetite 3 3 3   Feeling bad or failure about yourself  0 0 3  Trouble concentrating 1 0 3  Moving slowly or fidgety/restless 0 0 3  Suicidal thoughts 0 0 2  PHQ-9 Score 10 11 25   Difficult doing work/chores Not difficult at all Somewhat difficult Extremely dIfficult   GAD 7 : Generalized Anxiety Score 02/19/2019 09/01/2018 08/01/2018  Nervous, Anxious, on Edge 3 3 3   Control/stop worrying 3 3 3   Worry too much - different things 3 3 3   Trouble relaxing 3 3 3   Restless 3 2 2   Easily annoyed or irritable 3 3 2   Afraid - awful might happen 1 2 3   Total GAD 7 Score 19 19 19   Anxiety Difficulty Not difficult at all Somewhat difficult Extremely difficult     Assessment/plan: 1. GAD (generalized anxiety disorder) GAD 7 score with no improvement, but she is clinically in a much better place. Her whole demeanor is much better today. She states if she takes her klonopin she is normal. Since she hates the zolot and is only on 25mg  dose we are going to wean her off of this. I do want her to start with the buspar as I would like her GAD7 score to show some kind of significant improvement. Discussed how to take buspar. 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 Did discuss possible ADHD ,but she really doesn't exhibit this type of behavior. Will f/u in 3 month or sooner if needed.   2. Menopause -labs  done with integrative doctor and show her to be menopausal. Will do trial of gabapentin to see if helps with sleep and may have some benefits with her anxiety. She has no desire to do any SNRI for this. No night sweats so not a candidate for HRT either. F/u in 3 months.   Return in about 3 months (around 05/21/2019).   Orma Flaming, MD [place]   02/19/2019

## 2019-02-19 NOTE — Patient Instructions (Addendum)
Let's stop the zoloft since you hate, messes up sex drive and on very low dose. Just do every other day for a week or so then stop.   buspar 7.5mg . you can take this three times a day if you want. Max dosage is 30mg /day. Another thing after you have taken the 7.5mg  for a week or more, you can increase to 15mg  in the AM and then take a 7.5mg  pill as needed in the afternoon or evening.    Sending in gabapentin to help with menopausal symptoms. Not first line, but may help with sleep and anxiety.   Let's see you back in 3 months and see how you are doing.Marland Kitchen

## 2019-02-23 ENCOUNTER — Encounter: Payer: Self-pay | Admitting: Family Medicine

## 2019-03-21 ENCOUNTER — Encounter: Payer: Self-pay | Admitting: Family Medicine

## 2019-04-19 ENCOUNTER — Other Ambulatory Visit: Payer: Self-pay | Admitting: Obstetrics & Gynecology

## 2019-04-19 DIAGNOSIS — Z1231 Encounter for screening mammogram for malignant neoplasm of breast: Secondary | ICD-10-CM

## 2019-04-23 ENCOUNTER — Encounter: Payer: Self-pay | Admitting: Family Medicine

## 2019-05-30 ENCOUNTER — Other Ambulatory Visit: Payer: Self-pay | Admitting: Family Medicine

## 2019-05-30 MED ORDER — CLONAZEPAM 0.5 MG PO TABS: 0.5000 mg | ORAL_TABLET | Freq: Two times a day (BID) | ORAL | 1 refills | Status: DC

## 2019-06-12 NOTE — Progress Notes (Signed)
49 y.o. [id number] Divorced/remarried White or Caucasian female here for annual exam.  Having a lot of insomnia, hair thinning, vaginal dryness that is really affecting her relationship with her husband.  They are using lubricant.    No LMP recorded. Patient has had an ablation.          Sexually active: Yes.    The current method of family planning is ablation.    Exercising: Yes.    walking, biking Smoker:  No former smoker   Health Maintenance: Pap: 05-26-18 negative, HR HPV negative 05/25/17 Ascus + HR HPV,  History of abnormal Pap:  yes LOV:FIEPPIRJJ 06/14/19 BIRADS 1 negative  Colonoscopy:  01-24-2019 polyps, 5 year f/u per patient BMD:   never  TDaP:  01/2016 Pneumonia vaccine(s):  NA Shingrix:   NA Hep C testing: NA Screening Labs: discuss with provider   reports that she quit smoking about 6 years ago. Her smoking use included cigarettes. She has never used smokeless tobacco. She reports current alcohol use of about 5.0 standard drinks of alcohol per week. She reports that she does not use drugs.  Past Medical History:  Diagnosis Date  . Anxiety   . Depression   . GERD (gastroesophageal reflux disease)   . History of back surgery 12/18/2018  . IBS (irritable bowel syndrome)     Past Surgical History:  Procedure Laterality Date  . AUGMENTATION MAMMAPLASTY Bilateral 03/2017  . BACK SURGERY Bilateral 12/18/2018  . BILATERAL SALPINGECTOMY Bilateral 10/31/2012   Procedure: BILATERAL SALPINGECTOMY;  Surgeon: Lyman Speller, MD;  Location: [facility];  Service: Gynecology;  Laterality: Bilateral;  . BREAST ENHANCEMENT SURGERY  04/12/2017  . CESAREAN SECTION    . CYSTO WITH HYDRODISTENSION N/A 10/31/2012   Procedure: CYSTOSCOPY/HYDRODISTENSION;  Surgeon: Reece Packer, MD;  Location: [facility];  Service: Urology;  Laterality: N/A;  . DILATION AND CURETTAGE OF UTERUS    . DILITATION & CURRETTAGE/HYSTROSCOPY WITH NOVASURE ABLATION N/A 10/31/2012   Procedure: DILATATION &  CURETTAGE/HYSTEROSCOPY WITH NOVASURE ABLATION;  Surgeon: Lyman Speller, MD;  Location: [facility];  Service: Gynecology;  Laterality: N/A;  Dr Suzanne Boron Diarmid to start case with a cysto at beginning of case while patient under anesthesia.  . ENDOMETRIAL BIOPSY  09/19/12   neg  . LAPAROSCOPY Bilateral 10/31/2012   Procedure: LAPAROSCOPY OPERATIVE;  Surgeon: Lyman Speller, MD;  Location: [facility];  Service: Gynecology;  Laterality: Bilateral;    Current Outpatient Medications  Medication Sig Dispense Refill  . acetaminophen (TYLENOL) 325 MG tablet Take 650 mg by mouth every 6 (six) hours as needed for mild pain.    . clobetasol ointment (TEMOVATE) 8.84 % Apply 1 application topically 2 (two) times daily. Apply as directed twice daily for up to 7 days. 60 g 2  . clonazePAM (KLONOPIN) 0.5 MG tablet Take 1 tablet (0.5 mg total) by mouth 2 (two) times daily. 60 tablet 1  . escitalopram (LEXAPRO) 5 MG tablet Take 5 mg by mouth 2 (two) times daily.    . ondansetron (ZOFRAN) 4 MG tablet Take 1 tablet (4 mg total) by mouth every 8 (eight) hours as needed for nausea or vomiting. 30 tablet 0   No current facility-administered medications for this visit.    Family History  Problem Relation Age of Onset  . Stroke Father   . Non-Hodgkin's lymphoma Father   . Hypertension Father   . Kidney disease Father   . Hypertension Mother   . Club foot Son  Review of Systems  Endocrine:       Night sweats  Genitourinary: Positive for urgency.       Decreased libido   Skin:       Hair loss  Psychiatric/Behavioral: Positive for sleep disturbance.  All other systems reviewed and are negative.   Exam:   BP 102/62 (BP Location: Right Arm, Patient Position: Sitting, Cuff Size: Normal)   Pulse 66   Temp 98.6 F (37 C) (Skin)   Resp 12   Ht 5' 3.75" (1.619 m)   Wt 132 lb 4 oz (60 kg)   BMI 22.88 kg/m    Height: 5' 3.75" (161.9 cm)  Ht Readings from Last 3 Encounters:  06/15/19 5' 3.75" (1.619 m)   02/19/19 _0  (1.626 m)  08/04/18 _1  (1.626 m)   General appearance: alert, cooperative and appears stated age Head: Normocephalic, without obvious abnormality, atraumatic Neck: no adenopathy, supple, symmetrical, trachea midline and thyroid normal to inspection and palpation Lungs: clear to auscultation bilaterally Breasts: normal appearance, no masses or tenderness Heart: regular rate and rhythm Abdomen: soft, non-tender; bowel sounds normal; no masses,  no organomegaly Extremities: extremities normal, atraumatic, no cyanosis or edema Skin: Skin color, texture, turgor normal. No rashes or lesions Lymph nodes: Cervical, supraclavicular, and axillary nodes normal. No abnormal inguinal nodes palpated Neurologic: Grossly normal   Pelvic: External genitalia:  hyperpigmenting almost circumferentially around the vagina including the clitoral region              Urethra:  normal appearing urethra with no masses, tenderness or lesions              Bartholins and Skenes: normal                 Vagina: normal appearing vagina with normal color and discharge, no lesions              Cervix: no lesions              Pap taken: Yes.   Bimanual Exam:  Uterus:  normal size, contour, position, consistency, mobility, non-tender              Adnexa: normal adnexa and no mass, fullness, tenderness               Rectovaginal: Confirms               Anus:  normal sphincter tone, no lesions  Chaperone, Karmen Bongo, RN, was present for exam.  A:  Well Woman with normal exam Amenorrhea after endometrial ablation Anxiety, esp around tax season with work stressors H/O bilateral salpingectomy Menopausal symptoms that she feels needs to be treated H/o LSA of vulva H/o +HR HPV on pap, neg pap and neg HR HPV 2019 H/O colon polyps Urinary urgency  P:   Mammogram guidelines reviewed.  Done yesterday. pap smear and HR HPV obtained today Lexapro 12m bid #60/12RF Total testosterone obtained today.   Pt has lab work done with integrative provider Colonoscopy due 5 years BMD around age [age]Combipatch 50/250 one patch to skin twice weekly  #8/3R.  Recheck 3 months.   Urine micro and culture pending.  Bactrim DS bid x 3 days.  #6/0RF Clobetasol 0.05% ointment bid and recheck 6 weeks.  Rx to pharmacy. Return annually or prn

## 2019-06-14 ENCOUNTER — Ambulatory Visit
Admission: RE | Admit: 2019-06-14 | Discharge: 2019-06-14 | Disposition: A | Discharge status: Home / Self Care | Payer: 59 | Source: Ambulatory Visit | Attending: Obstetrics & Gynecology | Admitting: Obstetrics & Gynecology

## 2019-06-14 ENCOUNTER — Other Ambulatory Visit: Payer: Self-pay

## 2019-06-14 DIAGNOSIS — Z1231 Encounter for screening mammogram for malignant neoplasm of breast: Secondary | ICD-10-CM

## 2019-06-14 IMAGING — MG DIGITAL SCREENING BREAST BILAT IMPLANT W/ TOMO W/ CAD
8 of 12 series · 8 of 28 positions shown · non-contrast
Comparison: Previous exam(s).

CLINICAL DATA: Screening.

EXAM:
DIGITAL SCREENING BILATERAL MAMMOGRAM WITH IMPLANTS, CAD AND TOMO
The patient has retropectoral implants. Standard and implant
displaced views were performed.

[R MLO]
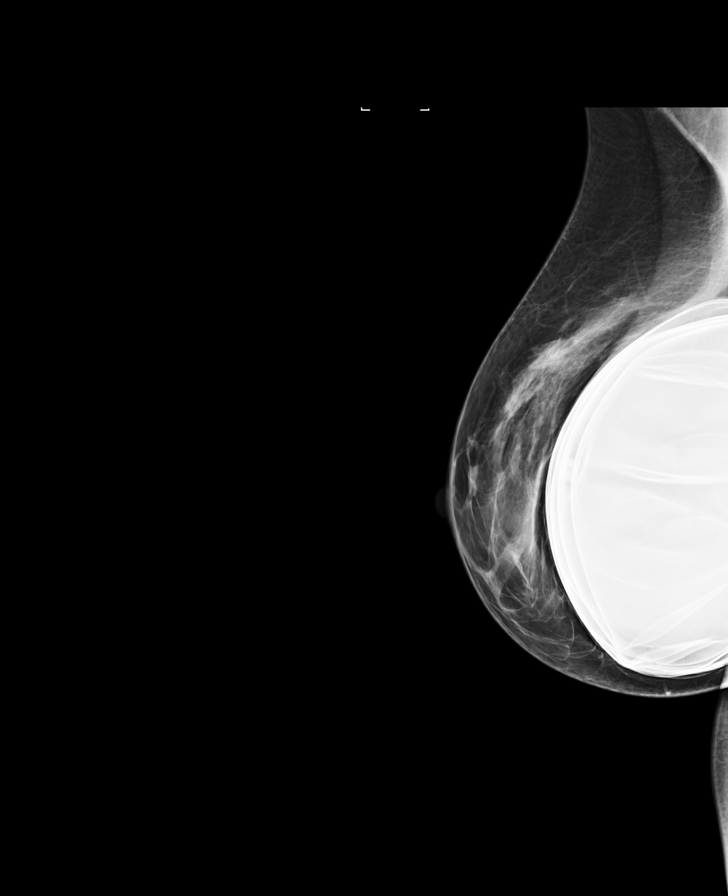

[L MLO]
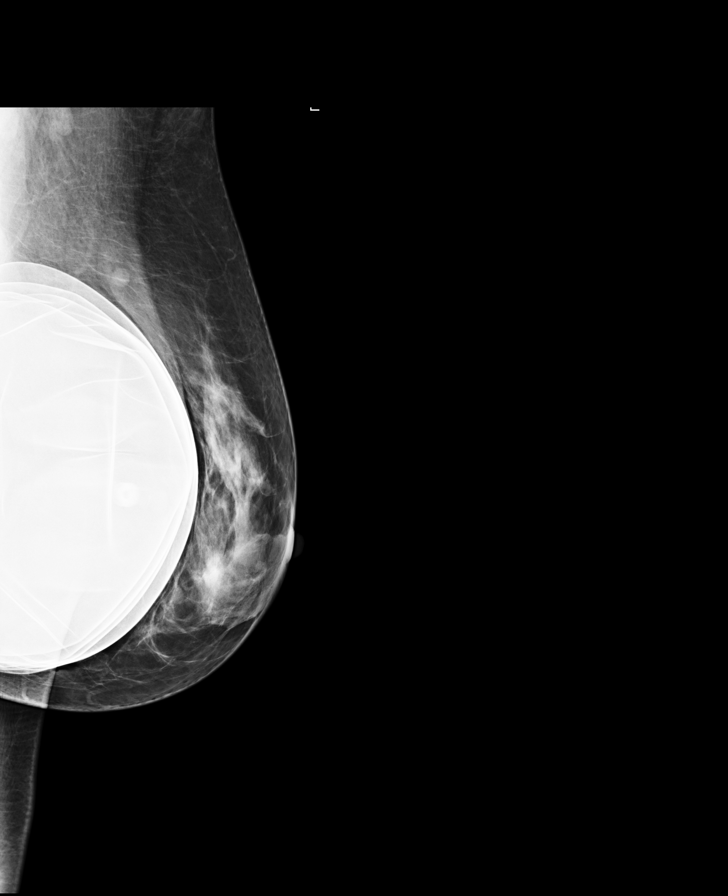

[L CC]
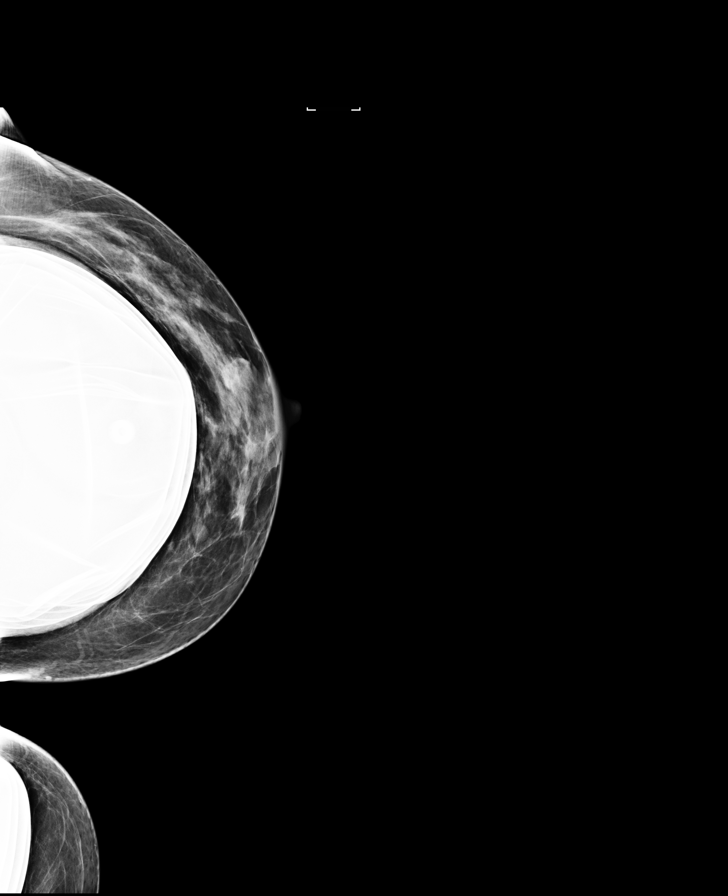

[R CC]
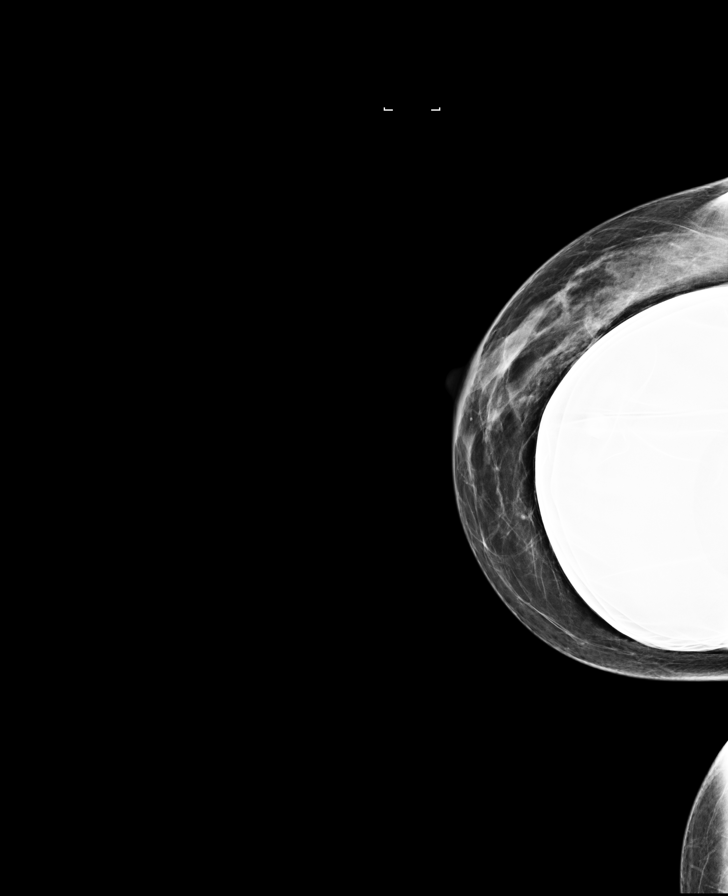

[R CC synth-2D]
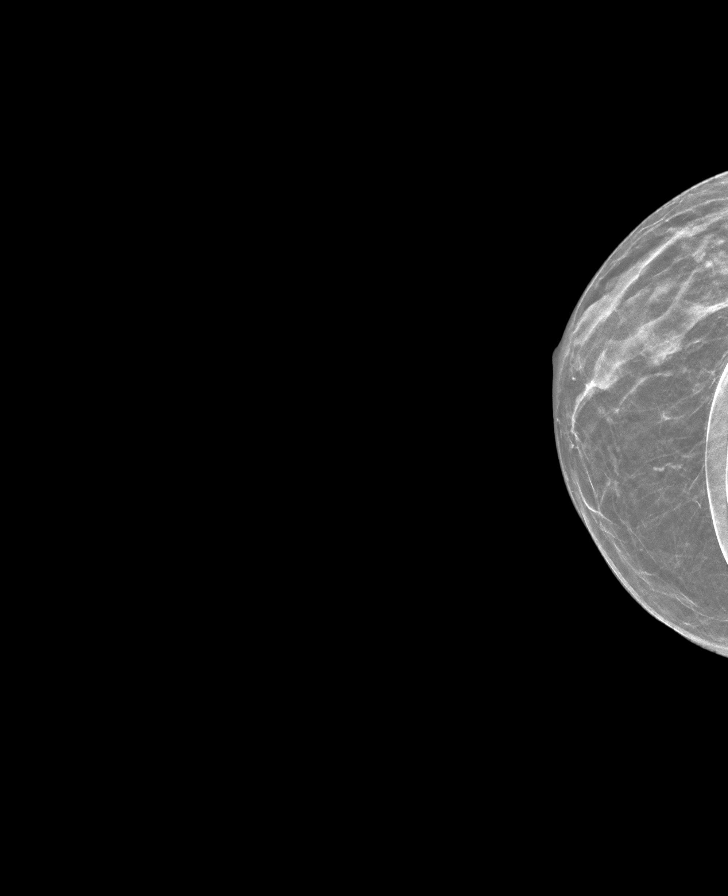

[R MLO synth-2D]
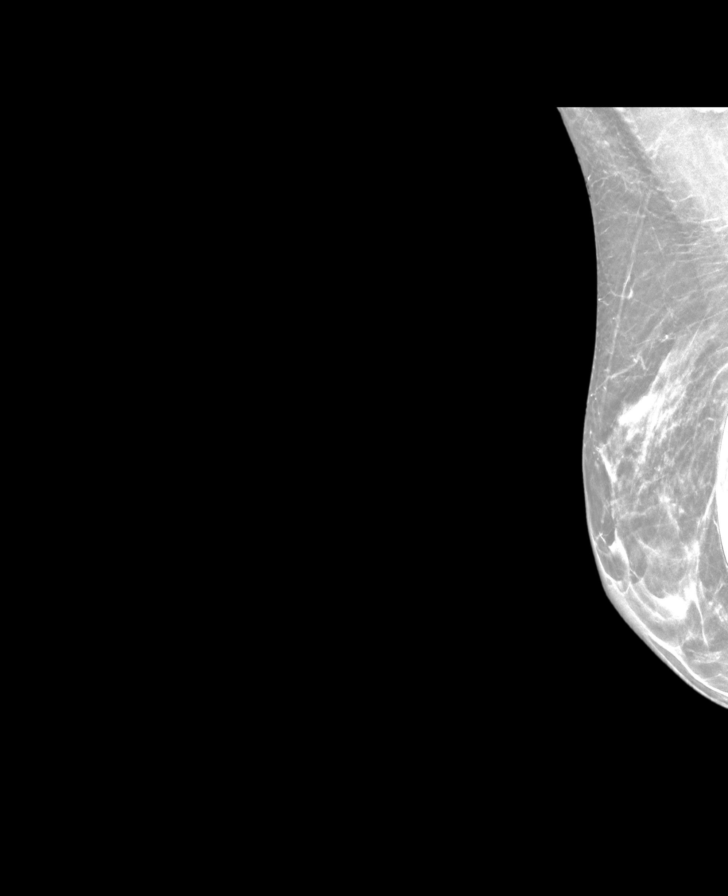

[L MLO synth-2D]
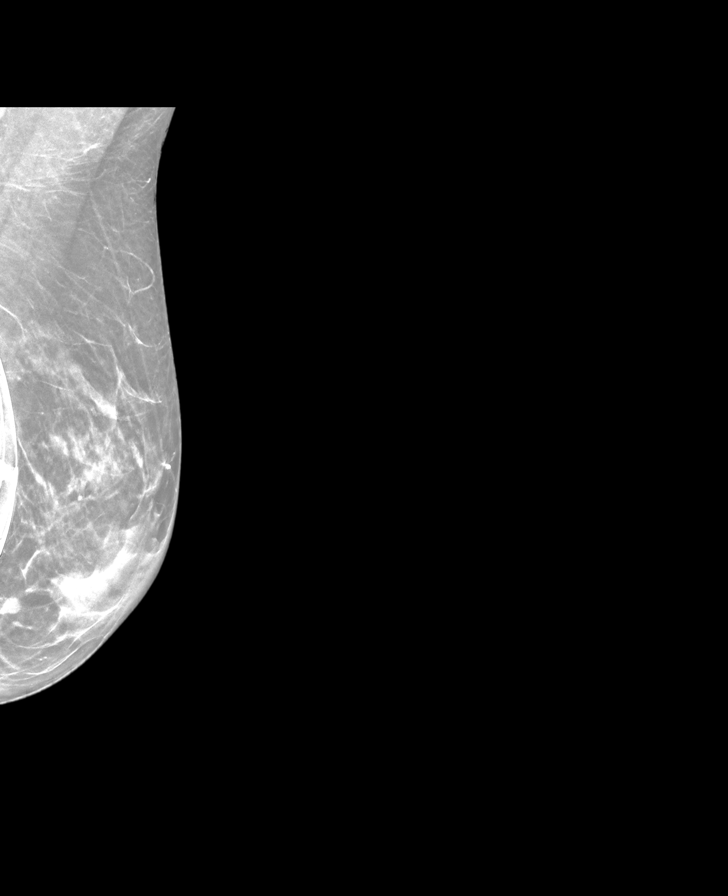

[L CC synth-2D]
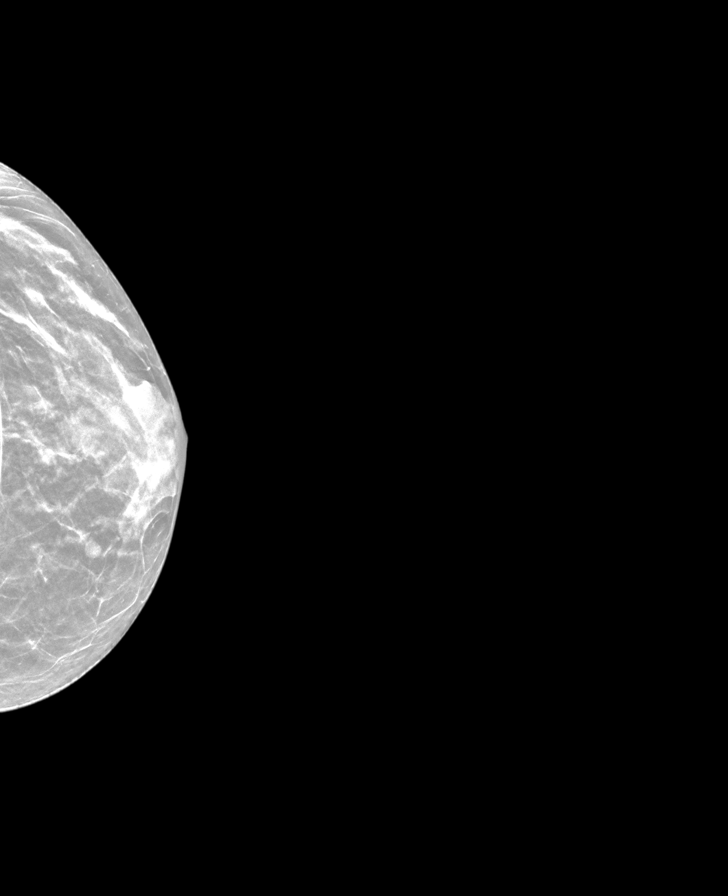

[8 of 28 positions shown; findings below may reference images not displayed]

ACR Breast Density Category c: The breast tissue is heterogeneously
dense, which may obscure small masses.
FINDINGS: There are no findings suspicious for malignancy. Images were
processed with CAD.
IMPRESSION: No mammographic evidence of malignancy. A result letter of this
screening mammogram will be mailed directly to the patient.

RECOMMENDATION:
Screening mammogram in one year. (Code:[5L])

BI-RADS CATEGORY  1:  Negative.

## 2019-06-15 ENCOUNTER — Other Ambulatory Visit: Payer: Self-pay

## 2019-06-15 ENCOUNTER — Other Ambulatory Visit (HOSPITAL_COMMUNITY)
Admission: RE | Admit: 2019-06-15 | Discharge: 2019-06-15 | Disposition: A | Discharge status: Home / Self Care | Payer: 59 | Source: Ambulatory Visit | Attending: Obstetrics & Gynecology | Admitting: Obstetrics & Gynecology

## 2019-06-15 ENCOUNTER — Encounter: Payer: Self-pay | Admitting: Obstetrics & Gynecology

## 2019-06-15 ENCOUNTER — Ambulatory Visit: Payer: 59 | Admitting: Obstetrics & Gynecology

## 2019-06-15 VITALS — BP 102/62 | HR 66 | Temp 98.6°F | Resp 12 | Ht 63.75 in | Wt 132.2 lb

## 2019-06-15 DIAGNOSIS — R3915 Urgency of urination: Secondary | ICD-10-CM | POA: Diagnosis not present

## 2019-06-15 DIAGNOSIS — B977 Papillomavirus as the cause of diseases classified elsewhere: Secondary | ICD-10-CM | POA: Diagnosis not present

## 2019-06-15 DIAGNOSIS — Z01419 Encounter for gynecological examination (general) (routine) without abnormal findings: Secondary | ICD-10-CM

## 2019-06-15 DIAGNOSIS — Z1151 Encounter for screening for human papillomavirus (HPV): Secondary | ICD-10-CM | POA: Diagnosis not present

## 2019-06-15 DIAGNOSIS — Z8619 Personal history of other infectious and parasitic diseases: Secondary | ICD-10-CM | POA: Insufficient documentation

## 2019-06-15 DIAGNOSIS — R6882 Decreased libido: Secondary | ICD-10-CM

## 2019-06-15 LAB — POCT URINALYSIS DIPSTICK
Bilirubin, UA: NEGATIVE
Glucose, UA: NEGATIVE
Ketones, UA: NEGATIVE
Leukocytes, UA: NEGATIVE
Nitrite, UA: NEGATIVE
Protein, UA: NEGATIVE
Urobilinogen, UA: 0.2 E.U./dL
pH, UA: 5 (ref 5.0–8.0)

## 2019-06-15 MED ORDER — SULFAMETHOXAZOLE-TRIMETHOPRIM 800-160 MG PO TABS: 1.0000 | ORAL_TABLET | Freq: Two times a day (BID) | ORAL | 0 refills | Status: DC

## 2019-06-15 MED ORDER — COMBIPATCH 0.05-0.25 MG/DAY TD PTTW: 1.0000 | MEDICATED_PATCH | TRANSDERMAL | 3 refills | Status: DC

## 2019-06-15 MED ORDER — ESCITALOPRAM OXALATE 5 MG PO TABS: 5.0000 mg | ORAL_TABLET | Freq: Two times a day (BID) | ORAL | 12 refills | Status: DC

## 2019-06-15 MED ORDER — CLOBETASOL PROPIONATE 0.05 % EX OINT: 1.0000 "application " | TOPICAL_OINTMENT | Freq: Two times a day (BID) | CUTANEOUS | 1 refills | Status: DC

## 2019-06-16 LAB — URINALYSIS, MICROSCOPIC ONLY: Casts: NONE SEEN /lpf

## 2019-06-16 LAB — URINE CULTURE

## 2019-06-20 LAB — CYTOLOGY - PAP
Adequacy: ABSENT
Comment: NEGATIVE
Diagnosis: NEGATIVE
High risk HPV: NEGATIVE

## 2019-06-21 LAB — TESTOSTERONE, TOTAL, LC/MS/MS: Testosterone, total: 19 ng/dL

## 2019-06-24 ENCOUNTER — Encounter: Payer: Self-pay | Admitting: Obstetrics & Gynecology

## 2019-07-26 ENCOUNTER — Other Ambulatory Visit: Payer: Self-pay

## 2019-07-27 ENCOUNTER — Ambulatory Visit: Payer: 59 | Admitting: Obstetrics & Gynecology

## 2019-07-27 ENCOUNTER — Encounter: Payer: Self-pay | Admitting: Obstetrics & Gynecology

## 2019-07-27 VITALS — BP 110/60 | HR 84 | Temp 97.9°F | Resp 10 | Ht 63.75 in | Wt 132.0 lb

## 2019-07-27 DIAGNOSIS — R6882 Decreased libido: Secondary | ICD-10-CM

## 2019-07-27 DIAGNOSIS — L900 Lichen sclerosus et atrophicus: Secondary | ICD-10-CM

## 2019-07-27 MED ORDER — CLOBETASOL PROPIONATE 0.05 % EX OINT: 1.0000 "application " | TOPICAL_OINTMENT | Freq: Two times a day (BID) | CUTANEOUS | 1 refills | Status: DC

## 2019-07-27 MED ORDER — NONFORMULARY OR COMPOUNDED ITEM: 1 refills | Status: DC

## 2019-07-27 NOTE — Progress Notes (Signed)
GYNECOLOGY  VISIT  HPI: 49 y.o. [id number] Divorced White or Caucasian female here for 6 week recheck.  She started combipatch about six weeks ago.  This has helped the vaginal dryness but not libido.  She is having some breast tenderness in the last week.  Since she really hasn't gotten a lot of improvement other wise and the patch is very expensive.  She decided to try this for a month or two.  She hasn't had any skin irritation.    Testosterone level in January was 19.    GYNECOLOGIC HISTORY: No LMP recorded. Patient has had an ablation. Contraception: Postmenopausal Menopausal hormone therapy: Combipatch  Patient Active Problem List   Diagnosis Date Noted  . GAD (generalized anxiety disorder) 08/01/2018  . Seizure ([place]) 05/17/2013  . Nausea alone 01/03/2013  . Slow transit constipation 08/12/2011  . Seasonal allergic rhinitis 08/12/2011  . Tobacco use disorder 01/10/2011  . HEMORRHOIDS 05/07/2010  . Depression, recurrent ([place]) 04/17/2010  . HEMATURIA UNSPECIFIED 04/17/2010    Past Medical History:  Diagnosis Date  . Anxiety   . Depression   . GERD (gastroesophageal reflux disease)   . History of back surgery 12/18/2018  . IBS (irritable bowel syndrome)     Past Surgical History:  Procedure Laterality Date  . AUGMENTATION MAMMAPLASTY Bilateral 03/2017  . BACK SURGERY Bilateral 12/18/2018  . BILATERAL SALPINGECTOMY Bilateral 10/31/2012   Procedure: BILATERAL SALPINGECTOMY;  Surgeon: Lyman Speller, MD;  Location: [facility];  Service: Gynecology;  Laterality: Bilateral;  . BREAST ENHANCEMENT SURGERY  04/12/2017  . CESAREAN SECTION    . CYSTO WITH HYDRODISTENSION N/A 10/31/2012   Procedure: CYSTOSCOPY/HYDRODISTENSION;  Surgeon: Reece Packer, MD;  Location: [facility];  Service: Urology;  Laterality: N/A;  . DILATION AND CURETTAGE OF UTERUS    . DILITATION & CURRETTAGE/HYSTROSCOPY WITH NOVASURE ABLATION N/A 10/31/2012   Procedure: DILATATION & CURETTAGE/HYSTEROSCOPY WITH  NOVASURE ABLATION;  Surgeon: Lyman Speller, MD;  Location: [facility];  Service: Gynecology;  Laterality: N/A;  Dr Suzanne Boron Diarmid to start case with a cysto at beginning of case while patient under anesthesia.  . ENDOMETRIAL BIOPSY  09/19/12   neg  . LAPAROSCOPY Bilateral 10/31/2012   Procedure: LAPAROSCOPY OPERATIVE;  Surgeon: Lyman Speller, MD;  Location: [facility];  Service: Gynecology;  Laterality: Bilateral;    MEDS:   Current Outpatient Medications on File Prior to Visit  Medication Sig Dispense Refill  . acetaminophen (TYLENOL) 325 MG tablet Take 650 mg by mouth every 6 (six) hours as needed for mild pain.    . clobetasol ointment (TEMOVATE) 0.17 % Apply 1 application topically 2 (two) times daily. 30 g 1  . clonazePAM (KLONOPIN) 0.5 MG tablet Take 1 tablet (0.5 mg total) by mouth 2 (two) times daily. 60 tablet 1  . escitalopram (LEXAPRO) 5 MG tablet Take 1 tablet (5 mg total) by mouth 2 (two) times daily. 60 tablet 12  . estradiol-norethindrone (COMBIPATCH) 0.05-0.25 MG/DAY Place 1 patch onto the skin 2 (two) times a week. 8 patch 3  . ondansetron (ZOFRAN) 4 MG tablet Take 1 tablet (4 mg total) by mouth every 8 (eight) hours as needed for nausea or vomiting. 30 tablet 0   No current facility-administered medications on file prior to visit.    ALLERGIES: Ciprofloxacin  Family History  Problem Relation Age of Onset  . Stroke Father   . Non-Hodgkin's lymphoma Father   . Hypertension Father   . Kidney disease Father   . Hypertension Mother   .  Club foot Son     SH:  Married, non smoker  Review of Systems  All other systems reviewed and are negative.   PHYSICAL EXAMINATION:    BP 110/60 (BP Location: Right Arm, Patient Position: Sitting, Cuff Size: Normal)   Pulse 84   Temp 97.9 F (36.6 C) (Temporal)   Resp 10   Ht 5' 3.75" (1.619 m)   Wt 132 lb (59.9 kg)   BMI 22.84 kg/m     General appearance: alert, cooperative and appears stated age Lymph:  no inguinal LAD  noted  Pelvic: External genitalia:  symmmetric hypopigmentation around clitorial region and labia minora, decreased hypopigmentation since prior visit              Urethra:  normal appearing urethra with no masses, tenderness or lesions              Bartholins and Skenes: normal                 Vagina: much more normal appearing vaginal mucosa, no lesions or discharge              Cervix: no lesions             Chaperone, Terence Lux, CMA, was present for exam.  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  Rx for clobetasol to pharmacy. D/w pt additional options for treatment of libido.  She would prefer something with less systemic risks so will stop combipatch and transition to vaginal preparation with estradiol and testosterone.  Will have this compounded at [facility].  estradio 0.25/ testosterone 0.48m/ml, one ml two to three times weekly.   Recheck 3 months.  Will check testosterone at that visit and possibly do vulvar biopsy as well.

## 2019-09-24 ENCOUNTER — Other Ambulatory Visit: Payer: Self-pay | Admitting: Family Medicine

## 2019-09-28 ENCOUNTER — Ambulatory Visit: Payer: 59 | Admitting: Obstetrics & Gynecology

## 2019-10-22 ENCOUNTER — Ambulatory Visit (INDEPENDENT_AMBULATORY_CARE_PROVIDER_SITE_OTHER): Payer: 59 | Admitting: Family Medicine

## 2019-10-22 ENCOUNTER — Encounter: Payer: Self-pay | Admitting: Family Medicine

## 2019-10-22 ENCOUNTER — Other Ambulatory Visit: Payer: Self-pay

## 2019-10-22 VITALS — BP 100/72 | HR 63 | Temp 97.3°F | Ht 63.75 in | Wt 134.8 lb

## 2019-10-22 DIAGNOSIS — Z0000 Encounter for general adult medical examination without abnormal findings: Secondary | ICD-10-CM

## 2019-10-22 DIAGNOSIS — G44229 Chronic tension-type headache, not intractable: Secondary | ICD-10-CM | POA: Diagnosis not present

## 2019-10-22 DIAGNOSIS — F411 Generalized anxiety disorder: Secondary | ICD-10-CM

## 2019-10-22 LAB — COMPREHENSIVE METABOLIC PANEL
ALT: 25 U/L (ref 0–35)
AST: 27 U/L (ref 0–37)
Albumin: 4.4 g/dL (ref 3.5–5.2)
Alkaline Phosphatase: 77 U/L (ref 39–117)
BUN: 13 mg/dL (ref 6–23)
CO2: 26 mEq/L (ref 19–32)
Calcium: 9.3 mg/dL (ref 8.4–10.5)
Chloride: 105 mEq/L (ref 96–112)
Creatinine, Ser: 0.66 mg/dL (ref 0.40–1.20)
GFR: 94.34 mL/min (ref >60.00)
Glucose, Bld: 85 mg/dL (ref 70–99)
Potassium: 4.2 mEq/L (ref 3.5–5.1)
Sodium: 139 mEq/L (ref 135–145)
Total Bilirubin: 0.6 mg/dL (ref 0.2–1.2)
Total Protein: 6.8 g/dL (ref 6.0–8.3)

## 2019-10-22 LAB — CBC WITH DIFFERENTIAL/PLATELET
Basophils Absolute: 0 10*3/uL (ref 0.0–0.1)
Basophils Relative: 0.6 % (ref 0.0–3.0)
Eosinophils Absolute: 0.2 10*3/uL (ref 0.0–0.7)
Eosinophils Relative: 3.6 % (ref 0.0–5.0)
HCT: 36.7 % (ref 36.0–46.0)
Hemoglobin: 12.5 g/dL (ref 12.0–15.0)
Lymphocytes Relative: 36.2 % (ref 12.0–46.0)
Lymphs Abs: 1.5 10*3/uL (ref 0.7–4.0)
MCHC: 34.2 g/dL (ref 30.0–36.0)
MCV: 94.8 fl (ref 78.0–100.0)
Monocytes Absolute: 0.5 10*3/uL (ref 0.1–1.0)
Monocytes Relative: 12.6 % — ABNORMAL HIGH (ref 3.0–12.0)
Neutro Abs: 2 10*3/uL (ref 1.4–7.7)
Neutrophils Relative %: 47 % (ref 43.0–77.0)
Platelets: 206 10*3/uL (ref 150.0–400.0)
RBC: 3.87 Mil/uL (ref 3.87–5.11)
RDW: 12.4 % (ref 11.5–15.5)
WBC: 4.2 10*3/uL (ref 4.0–10.5)

## 2019-10-22 LAB — LIPID PANEL
Cholesterol: 190 mg/dL (ref 0–200)
HDL: 69.4 mg/dL (ref >39.00)
LDL Cholesterol: 106 mg/dL — ABNORMAL HIGH (ref 0–99)
NonHDL: 120.47
Total CHOL/HDL Ratio: 3
Triglycerides: 73 mg/dL (ref 0.0–149.0)
VLDL: 14.6 mg/dL (ref 0.0–40.0)

## 2019-10-22 LAB — TSH: TSH: 0.99 u[IU]/mL (ref 0.35–4.50)

## 2019-10-22 MED ORDER — CLONAZEPAM 0.5 MG PO TABS: ORAL_TABLET | ORAL | 0 refills | Status: DC

## 2019-10-22 NOTE — Patient Instructions (Addendum)
1) increase lexapro to 26m/day. I would be keen to even put you on 282mday.   2) continue klonopin twice a day for now. Try to not nap... ill send this in for you.   3) order mri of brain and then once I have this back can talk about preventative meds for tension headache.     Tension Headache, Adult A tension headache is pain, pressure, or aching in your head. Tension headaches can last from 30 minutes to several days. Follow these instructions at home: Managing pain  Take over-the-counter and prescription medicines only as told by your doctor.  When you have a headache, lie down in a dark, quiet room.  If told, put ice on your head and neck: ? Put ice in a plastic bag. ? Place a towel between your skin and the bag. ? Leave the ice on for 20 minutes, 2-3 times a day.  If told, put heat on the back of your neck. Do this as often as your doctor tells you to. Use the kind of heat that your doctor recommends, such as a moist heat pack or a heating pad. ? Place a towel between your skin and the heat. ? Leave the heat on for 20-30 minutes. ? Remove the heat if your skin turns bright red. Eating and drinking  Eat meals on a regular schedule.  Watch how much alcohol you drink: ? If you are a woman and are not pregnant, do not drink more than 1 drink a day. ? If you are a man, do not drink more than 2 drinks a day.  Drink enough fluid to keep your pee (urine) pale yellow.  Do not use a lot of caffeine, or stop using caffeine. Lifestyle  Get enough sleep. Get 7-9 hours of sleep each night. Or get the amount of sleep that your doctor tells you to.  At bedtime, remove all electronic devices from your room. Examples of electronic devices are computers, phones, and tablets.  Find ways to lessen your stress. Some things that can lessen stress are: ? Exercise. ? Deep breathing. ? Yoga. ? Music. ? Positive thoughts.  Sit up straight. Do not tighten (tense) your muscles.  Do not  use any products that have nicotine or tobacco in them, such as cigarettes and e-cigarettes. If you need help quitting, ask your doctor. General instructions   Keep all follow-up visits as told by your doctor. This is important.  Avoid things that can bring on headaches. Keep a journal to find out if certain things bring on headaches. For example, write down: ? What you eat and drink. ? How much sleep you get. ? Any change to your diet or medicines. Contact a doctor if:  Your headache does not get better.  Your headache comes back.  You have a headache and sounds, light, or smells bother you.  You feel sick to your stomach (nauseous) or you throw up (vomit).  Your stomach hurts. Get help right away if:  You suddenly get a very bad headache along with any of these: ? A stiff neck. ? Feeling sick to your stomach. ? Throwing up. ? Feeling weak. ? Trouble seeing. ? Feeling short of breath. ? A rash. ? Feeling unusually sleepy. ? Trouble speaking. ? Pain in your eye or ear. ? Trouble walking or balancing. ? Feeling like you will pass out (faint). ? Passing out. Summary  A tension headache is pain, pressure, or aching in your head.  Tension headaches  can last from 30 minutes to several days.  Lifestyle changes and medicines may help relieve pain. This information is not intended to replace advice given to you by your health care provider. Make sure you discuss any questions you have with your health care provider. Document Revised: 03/14/2019 Document Reviewed: 08/27/2016 Elsevier Patient Education  2020 [employer] Inc[employer]    Preventive Care 51-103 Years Old, Female Preventive care refers to visits with your health care provider and lifestyle choices that can promote health and wellness. This includes:  A yearly physical exam. This may also be called an annual well check.  Regular dental visits and eye exams.  Immunizations.  Screening for certain  conditions.  Healthy lifestyle choices, such as eating a healthy diet, getting regular exercise, not using drugs or products that contain nicotine and tobacco, and limiting alcohol use. What can I expect for my preventive care visit? Physical exam Your health care provider will check your:  Height and weight. This may be used to calculate body mass index (BMI), which tells if you are at a healthy weight.  Heart rate and blood pressure.  Skin for abnormal spots. Counseling Your health care provider may ask you questions about your:  Alcohol, tobacco, and drug use.  Emotional well-being.  Home and relationship well-being.  Sexual activity.  Eating habits.  Work and work Statistician.  Method of birth control.  Menstrual cycle.  Pregnancy history. What immunizations do I need?  Influenza (flu) vaccine  This is recommended every year. Tetanus, diphtheria, and pertussis (Tdap) vaccine  You may need a Td booster every 10 years. Varicella (chickenpox) vaccine  You may need this if you have not been vaccinated. Zoster (shingles) vaccine  You may need this after age 61. Measles, mumps, and rubella (MMR) vaccine  You may need at least one dose of MMR if you were born in 1957 or later. You may also need a second dose. Pneumococcal conjugate (PCV13) vaccine  You may need this if you have certain conditions and were not previously vaccinated. Pneumococcal polysaccharide (PPSV23) vaccine  You may need one or two doses if you smoke cigarettes or if you have certain conditions. Meningococcal conjugate (MenACWY) vaccine  You may need this if you have certain conditions. Hepatitis A vaccine  You may need this if you have certain conditions or if you travel or work in places where you may be exposed to hepatitis A. Hepatitis B vaccine  You may need this if you have certain conditions or if you travel or work in places where you may be exposed to hepatitis B. Haemophilus  influenzae type b (Hib) vaccine  You may need this if you have certain conditions. Human papillomavirus (HPV) vaccine  If recommended by your health care provider, you may need three doses over 6 months. You may receive vaccines as individual doses or as more than one vaccine together in one shot (combination vaccines). Talk with your health care provider about the risks and benefits of combination vaccines. What tests do I need? Blood tests  Lipid and cholesterol levels. These may be checked every 5 years, or more frequently if you are over 96 years old.  Hepatitis C test.  Hepatitis B test. Screening  Lung cancer screening. You may have this screening every year starting at age 26 if you have a 30-pack-year history of smoking and currently smoke or have quit within the past 15 years.  Colorectal cancer screening. All adults should have this screening starting at age 28  and continuing until age 63. Your health care provider may recommend screening at age 63 if you are at increased risk. You will have tests every 1-10 years, depending on your results and the type of screening test.  Diabetes screening. This is done by checking your blood sugar (glucose) after you have not eaten for a while (fasting). You may have this done every 1-3 years.  Mammogram. This may be done every 1-2 years. Talk with your health care provider about when you should start having regular mammograms. This may depend on whether you have a family history of breast cancer.  BRCA-related cancer screening. This may be done if you have a family history of breast, ovarian, tubal, or peritoneal cancers.  Pelvic exam and Pap test. This may be done every 3 years starting at age 80. Starting at age 33, this may be done every 5 years if you have a Pap test in combination with an HPV test. Other tests  Sexually transmitted disease (STD) testing.  Bone density scan. This is done to screen for osteoporosis. You may have this  scan if you are at high risk for osteoporosis. Follow these instructions at home: Eating and drinking  Eat a diet that includes fresh fruits and vegetables, whole grains, lean protein, and low-fat dairy.  Take vitamin and mineral supplements as recommended by your health care provider.  Do not drink alcohol if: ? Your health care provider tells you not to drink. ? You are pregnant, may be pregnant, or are planning to become pregnant.  If you drink alcohol: ? Limit how much you have to 0-1 drink a day. ? Be aware of how much alcohol is in your drink. In the [place] one drink equals one 12 oz bottle of beer (355 mL), one 5 oz glass of wine (148 mL), or one 1 oz glass of hard liquor (44 mL). Lifestyle  Take daily care of your teeth and gums.  Stay active. Exercise for at least 30 minutes on 5 or more days each week.  Do not use any products that contain nicotine or tobacco, such as cigarettes, e-cigarettes, and chewing tobacco. If you need help quitting, ask your health care provider.  If you are sexually active, practice safe sex. Use a condom or other form of birth control (contraception) in order to prevent pregnancy and STIs (sexually transmitted infections).  If told by your health care provider, take low-dose aspirin daily starting at age 62. What's next?  Visit your health care provider once a year for a well check visit.  Ask your health care provider how often you should have your eyes and teeth checked.  Stay up to date on all vaccines. This information is not intended to replace advice given to you by your health care provider. Make sure you discuss any questions you have with your health care provider. Document Revised: 01/26/2018 Document Reviewed: 01/26/2018 Elsevier Patient Education  2020 [employer].

## 2019-10-22 NOTE — Progress Notes (Signed)
Patient: [name] [name] MRN: [id number] DOB: 07/20/1970 PCP: Orma Flaming, MD     Subjective:  Chief Complaint  Patient presents with  . Anxiety  . Annual Exam  . Headache    HPI: The patient is a 49 y.o. female who presents today for Anxiety. She is currently on lexpro 72m daily. She has not bumped this up. She is on klonopin .546mand was only taking this in the AM, but about 2 weeks ago she started to take this twice a day. When I met her she was on xanax and I changed her over to the klonopin. She was taking it once a day and then started to get light headed in the evenings and it goes away if she takes a klonopin.   She is also having a lot of headaches. This is not improved with the klonopin. She states this has been going on x 6 months. She just got put on testosterone. She states the headache lasts all day and she can wake up with them. She does look at a computer all day long. Headaches are in her frontal lobe and can get sharp pains in the side of her head. Pain rated as a 8/10 on some days. Feels like a vice/tight with sharpness. Do not wake her up in the middle of the night, no focal deficits, no vision changes, no thunderclap headache. No phonophobia. +photophobia. NO nausea/vomiting. She does take tylenol and aleve do help some.   Annual exam The patient is a [age]ear old female who presents today for annual exam. She denies any changes to past medical history. There have been no recent hospitalizations. They are following a well balanced diet and exercise plan. Weight has been stable.    Had her first covid shot.   Colonoscopy: 01/24/2019 Mammogram: 06/14/2019 Pap smear: 06/15/2019   Review of Systems  Constitutional: Positive for fatigue. Negative for chills and fever.  HENT: Negative for dental problem, ear pain, hearing loss and trouble swallowing.   Eyes: Negative for visual disturbance.  Respiratory: Negative for cough, chest tightness, shortness of breath and  wheezing.   Cardiovascular: Negative for chest pain, palpitations and leg swelling.  Gastrointestinal: Negative for abdominal pain, blood in stool, diarrhea and nausea.  Endocrine: Negative for cold intolerance, polydipsia, polyphagia and polyuria.  Genitourinary: Negative for dysuria and hematuria.  Musculoskeletal: Negative for arthralgias.  Skin: Negative for rash.  Neurological: Positive for light-headedness and headaches. Negative for dizziness, facial asymmetry, weakness and numbness.  Psychiatric/Behavioral: Negative for dysphoric mood and sleep disturbance. The patient is nervous/anxious.     Allergies Patient is allergic to ciprofloxacin.  Past Medical History Patient  has a past medical history of Anxiety, Depression, GERD (gastroesophageal reflux disease), History of back surgery (12/18/2018), and IBS (irritable bowel syndrome).  Surgical History Patient  has a past surgical history that includes Cesarean section; Dilation and curettage of uterus; Endometrial biopsy (09/19/12); Dilatation & currettage/hysteroscopy with novasure ablation (N/A, 10/31/2012); laparoscopy (Bilateral, 10/31/2012); Bilateral salpingectomy (Bilateral, 10/31/2012); cysto with hydrodistension (N/A, 10/31/2012); Breast enhancement surgery (04/12/2017); Augmentation mammaplasty (Bilateral, 03/2017); and Back surgery (Bilateral, 12/18/2018).  Family History Pateint's family history includes Club foot in her son; Hypertension in her father and mother; Kidney disease in her father; Non-Hodgkin's lymphoma in her father; Stroke in her father.  Social History Patient  reports that she quit smoking about 6 years ago. Her smoking use included cigarettes. She has never used smokeless tobacco. She reports current alcohol use of about 5.0 standard  drinks of alcohol per week. She reports that she does not use drugs.    Objective: Vitals:   10/22/19 1004  BP: 100/72  Pulse: 63  Temp: (!) 97.3 F (36.3 C)  TempSrc:  Temporal  SpO2: 98%  Weight: 134 lb 12.8 oz (61.1 kg)  Height: 5' 3.75" (1.619 m)    Body mass index is 23.32 kg/m.  Physical Exam Vitals reviewed.  Constitutional:      Appearance: Normal appearance. She is well-developed and normal weight.  HENT:     Head: Normocephalic and atraumatic.     Right Ear: Tympanic membrane, ear canal and external ear normal.     Left Ear: Tympanic membrane, ear canal and external ear normal.     Nose: Nose normal.     Mouth/Throat:     Mouth: Mucous membranes are moist.  Eyes:     Extraocular Movements: Extraocular movements intact.     Conjunctiva/sclera: Conjunctivae normal.     Pupils: Pupils are equal, round, and reactive to light.  Neck:     Thyroid: No thyromegaly.     Vascular: No carotid bruit.  Cardiovascular:     Rate and Rhythm: Normal rate and regular rhythm.     Pulses: Normal pulses.     Heart sounds: Normal heart sounds. No murmur.  Pulmonary:     Effort: Pulmonary effort is normal.     Breath sounds: Normal breath sounds.  Abdominal:     General: Abdomen is flat. Bowel sounds are normal. There is no distension.     Palpations: Abdomen is soft.     Tenderness: There is no abdominal tenderness.  Musculoskeletal:     Cervical back: Normal range of motion and neck supple.  Lymphadenopathy:     Cervical: No cervical adenopathy.  Skin:    General: Skin is warm and dry.     Capillary Refill: Capillary refill takes less than 2 seconds.     Findings: No rash.  Neurological:     General: No focal deficit present.     Mental Status: She is alert and oriented to person, place, and time.     Cranial Nerves: No cranial nerve deficit.     Motor: No weakness.     Coordination: Coordination normal.     Gait: Gait normal.     Deep Tendon Reflexes: Reflexes normal.  Psychiatric:        Mood and Affect: Mood normal.        Behavior: Behavior normal.      Office Visit from 10/22/2019 in [facility]  PHQ-2  Total Score  1      GAD 7 : Generalized Anxiety Score 10/22/2019 02/19/2019 09/01/2018 08/01/2018  Nervous, Anxious, on Edge _0 Control/stop worrying _1 Worry too much - different things _2 Trouble relaxing _3 Restless _4 Easily annoyed or irritable _5 Afraid - awful might happen _6 Total GAD 7 Score _7 Anxiety Difficulty Very difficult Not difficult at all Somewhat difficult Extremely difficult       Assessment/plan: 1. Annual physical exam Routine fasting labs. She is UTD on her HM. Continue healthy lifestyle and eating/exercise. F/u in one year.  Patient counseling _8    Nutrition: Stressed importance of moderation in sodium/caffeine intake, saturated fat and cholesterol, caloric balance, sufficient intake of fresh fruits, vegetables,  fiber, calcium, iron, and 1 mg of folate supplement per day (for females capable of pregnancy).  _0    Stressed the importance of regular exercise.   _1    Substance Abuse: Discussed cessation/primary prevention of tobacco, alcohol, or other drug use; driving or other dangerous activities under the influence; availability of treatment for abuse.   _2    Injury prevention: Discussed safety belts, safety helmets, smoke detector, smoking near bedding or upholstery.   _3    Sexuality: Discussed sexually transmitted diseases, partner selection, use of condoms, avoidance of unintended pregnancy  and contraceptive alternatives.  _4    Dental health: Discussed importance of regular tooth brushing, flossing, and dental visits.  _5    Health maintenance and immunizations reviewed. Please refer to Health maintenance section.    - CBC with Differential/Platelet - Comprehensive metabolic panel - Lipid panel - TSH  2. GAD (generalized anxiety disorder) GAD7 is still uncontrolled. We are going to increase her lexapro to 100m then possibly bump her up to 240m Our goal is to get her off the klonopin BID and move to  more as needed. She has a lot of anxiety causing stress in her life. Will see her back in 3 months for follow up of increased lexapro.   3. Chronic tension-type headache, not intractable No red flags on hx or exam. She has had this for months and not getting better with increased frequency. Will check MRI. Continue prn nsaids/tylenol. Did discuss if mri is normal we can discuss preventative therapy for tension headaches and discussed ways to decrease stress/tension in her life. Handout given on this. precautions given as well.    This visit occurred during the SARS-CoV-2 public health emergency.  Safety protocols were in place, including screening questions prior to the visit, additional usage of staff PPE, and extensive cleaning of exam room while observing appropriate contact time as indicated for disinfecting solutions.     Return in about 3 months (around 01/22/2020) for anxiety/headaches .   AlOrma FlamingMD [place] 10/22/2019

## 2019-10-24 NOTE — Addendum Note (Signed)
Addended by: Orma Flaming on: 10/24/2019 12:33 PM   Modules accepted: Orders

## 2019-10-26 ENCOUNTER — Other Ambulatory Visit: Payer: Self-pay

## 2019-10-26 ENCOUNTER — Other Ambulatory Visit: Payer: Self-pay | Admitting: *Deleted

## 2019-10-26 ENCOUNTER — Other Ambulatory Visit (INDEPENDENT_AMBULATORY_CARE_PROVIDER_SITE_OTHER): Payer: 59

## 2019-10-26 DIAGNOSIS — R6882 Decreased libido: Secondary | ICD-10-CM

## 2019-10-30 ENCOUNTER — Other Ambulatory Visit: Payer: 59

## 2019-10-30 LAB — TESTT+TESTF+SHBG
Sex Hormone Binding: 96.1 nmol/L (ref 17.3–125.0)
Testosterone, Free: 0.8 pg/mL (ref 0.0–4.2)
Testosterone, Total, LC/MS: 12.7 ng/dL

## 2019-11-05 ENCOUNTER — Encounter: Payer: Self-pay | Admitting: Family Medicine

## 2019-11-05 ENCOUNTER — Encounter: Payer: Self-pay | Admitting: Obstetrics & Gynecology

## 2019-11-05 ENCOUNTER — Telehealth: Payer: Self-pay | Admitting: Obstetrics & Gynecology

## 2019-11-05 NOTE — Telephone Encounter (Signed)
Non-Urgent Medical Question Received: Today Message Contents  [name], [name] Longhi sent to [place]  Phone Number: [phone]  [facility] Dr Sabra Heck,   I reviewed your note about my testosterone level. I think the cream is definitely helping. No dryness and sex drive has improved. I was only using it 2x a week. Do you think I should just try going to 3 days a week and then have my count rechecked in 3 months?   Thanks!  [name] [name]

## 2019-11-05 NOTE — Telephone Encounter (Signed)
Reviewed 10/26/19 testosterone labs.   Routing MyChart message to Dr. Sabra Heck to review and advise.

## 2019-11-07 NOTE — Telephone Encounter (Signed)
It is fine to increase to three times a week but advise it can cause acne so increase slowly.  If she thinks it is helping, might be safest to just stay where she is with dosing.  Either is fine with me.  Thanks.

## 2019-11-07 NOTE — Telephone Encounter (Signed)
Spoke with pt. Pt given recommendations per Dr Sabra Heck. Pt agreeable and will increase testosterone cream slowly. Pt states she will call back when close to running out of Rx to schedule 3 month testosterone level.   Routing to Dr Sabra Heck for review.  Encounter closed.

## 2019-11-21 ENCOUNTER — Ambulatory Visit
Admission: RE | Admit: 2019-11-21 | Discharge: 2019-11-21 | Disposition: A | Discharge status: Home / Self Care | Payer: 59 | Source: Ambulatory Visit | Attending: Family Medicine | Admitting: Family Medicine

## 2019-11-21 DIAGNOSIS — G44229 Chronic tension-type headache, not intractable: Secondary | ICD-10-CM

## 2019-11-21 IMAGING — MR MR HEAD W/O CM
10 series · 48 of 48 positions shown · non-contrast
Comparison: Brain MRI [DATE]

CLINICAL DATA: Worsening headaches with blurry vision and
lightheadedness

EXAM:
MRI HEAD WITHOUT CONTRAST
TECHNIQUE: Multiplanar, multiecho pulse sequences of the brain and surrounding
structures were obtained without intravenous contrast.

[Series 2: T1 · sagittal · 5.0mm · 0.45mm/px · 3 of 20 slices shown]
[im 1/20]
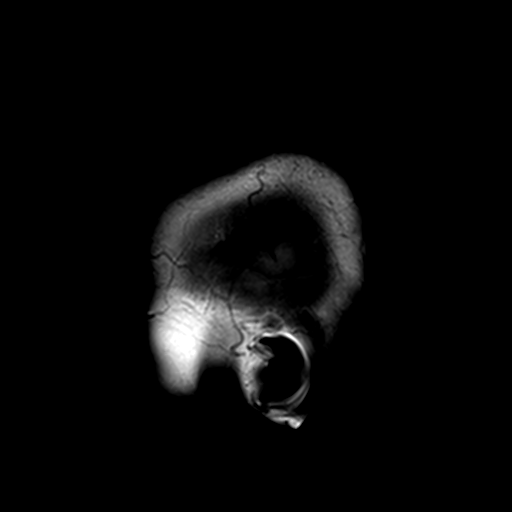
[im 10/20]
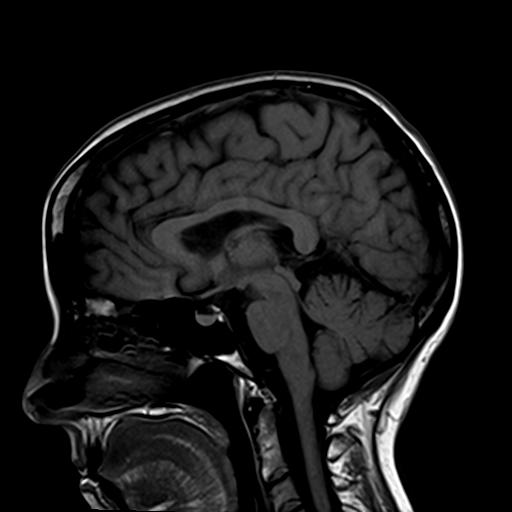
[im 20/20]
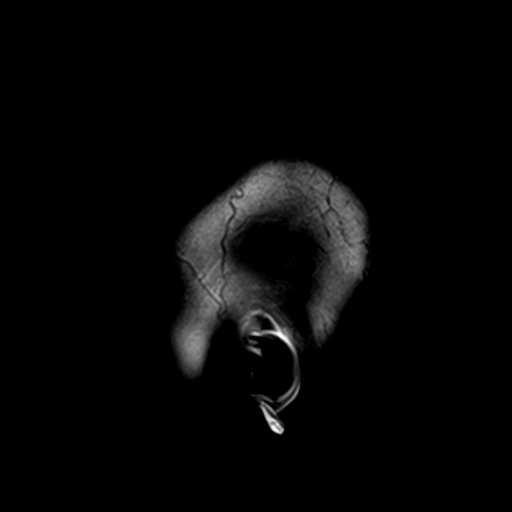

[Series 3: DWI · axial · 3.0mm · 1.80mm/px · z∈[-46,+101]mm · 9 of 99 slices shown (1 of 4)]
[im 1/99]
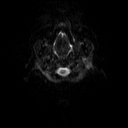
[im 13/99]
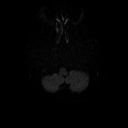
[im 25/99]
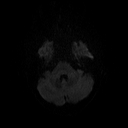
[im 37/99]
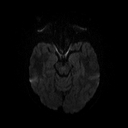
[im 50/99]
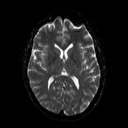
[im 62/99]
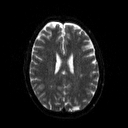
[im 74/99]
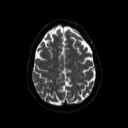
[im 86/99]
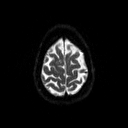
[im 99/99]
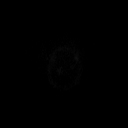

[Series 4: DWI · axial · 3.0mm · 1.80mm/px · z∈[-46,+101]mm · 4 of 49 slices shown (2 of 4)]
[im 1/49]
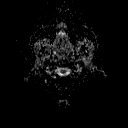
[im 17/49]
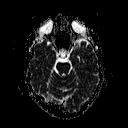
[im 33/49]
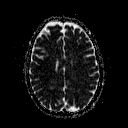
[im 49/49]
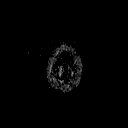

[Series 5: DWI · coronal · 5.0mm · 1.80mm/px · 6 of 69 slices shown (3 of 4)]
[im 1/69]
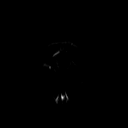
[im 14/69]
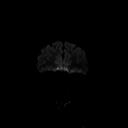
[im 28/69]
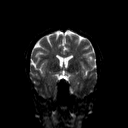
[im 41/69]
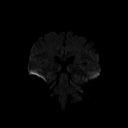
[im 55/69]
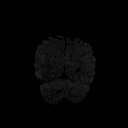
[im 69/69]
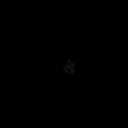

[Series 6: DWI · coronal · 5.0mm · 1.80mm/px · 3 of 35 slices shown (4 of 4)]
[im 1/35]
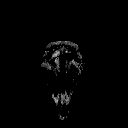
[im 18/35]
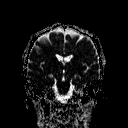
[im 35/35]
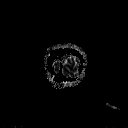

[Series 7: T2 · axial · 5.0mm · 0.51mm/px · z∈[-40,+100]mm · 2 of 21 slices shown (1 of 2)]
[im 1/21]
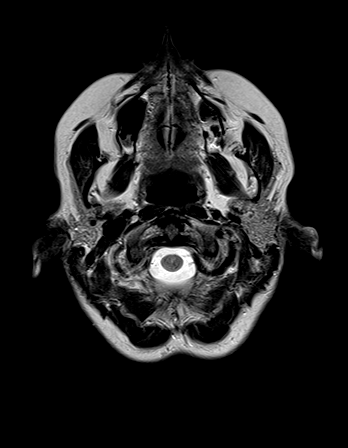
[im 21/21]
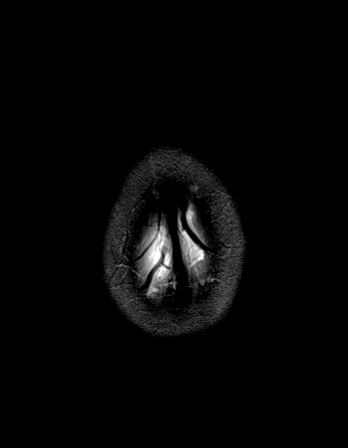

[Series 8: FLAIR · axial · 3.0mm · 0.45mm/px · z∈[-40,+99]mm · 3 of 31 slices shown]
[im 1/31]
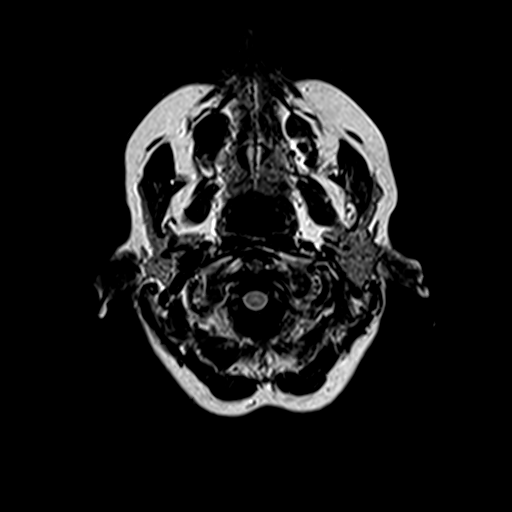
[im 16/31]
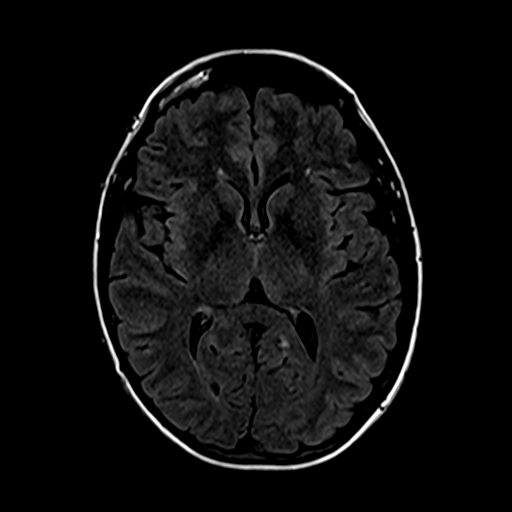
[im 31/31]
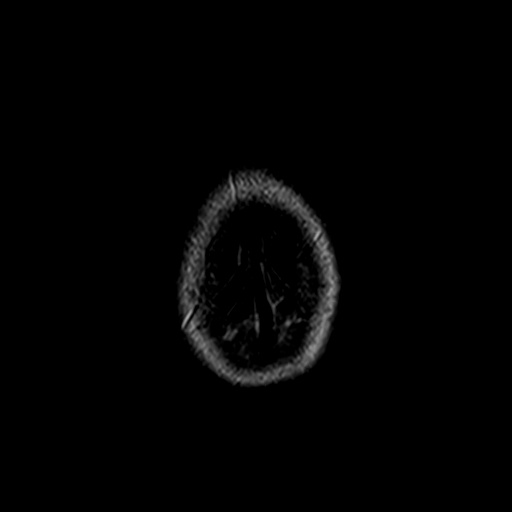

[Series 10: swi_images · axial · 4.0mm · 0.90mm/px · z∈[-40,+100]mm · 3 of 36 slices shown]
[im 1/36]
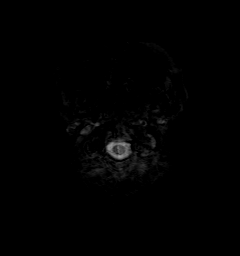
[im 18/36]
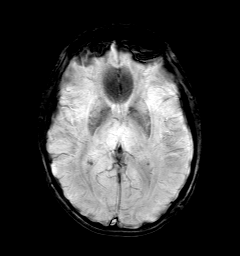
[im 36/36]
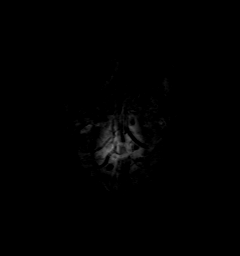

[Series 11: t1_mpr_tra · axial · 1.0mm · 0.71mm/px · z∈[-41,+102]mm · 13 of 144 slices shown]
[im 1/144]
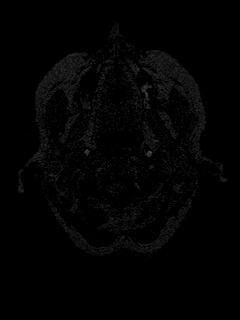
[im 12/144]
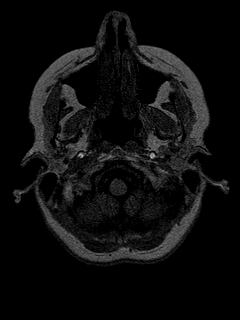
[im 24/144]
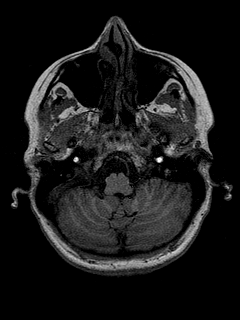
[im 36/144]
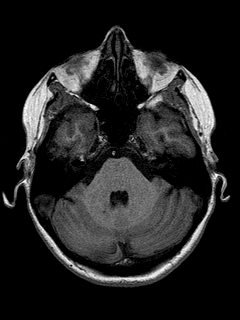
[im 48/144]
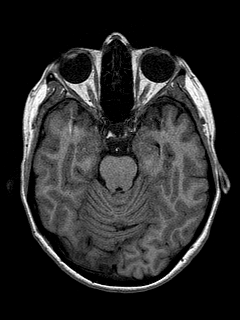
[im 60/144]
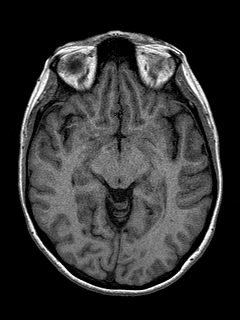
[im 72/144]
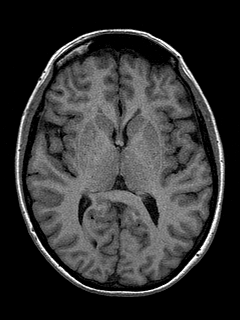
[im 84/144]
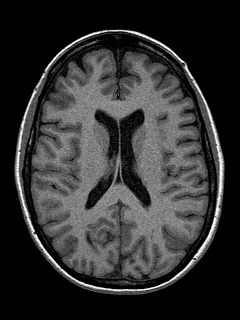
[im 96/144]
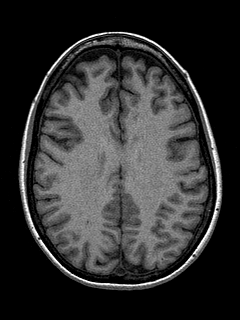
[im 108/144]
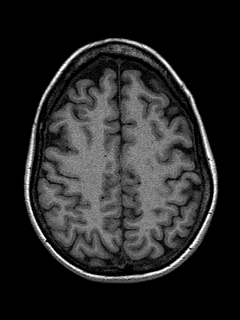
[im 120/144]
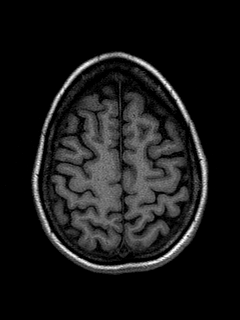
[im 132/144]
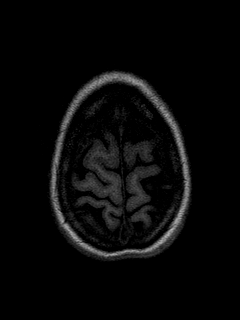
[im 144/144]
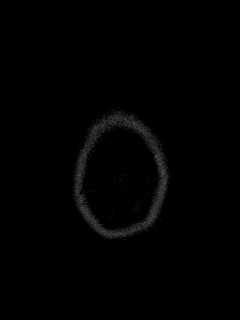

[Series 12: T2 · coronal · 5.0mm · 0.45mm/px · 2 of 27 slices shown (2 of 2)]
[im 1/27]
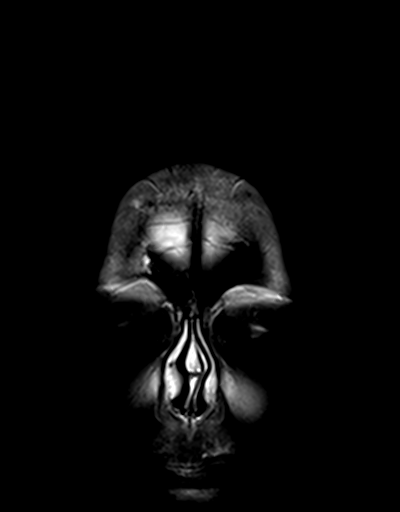
[im 27/27]
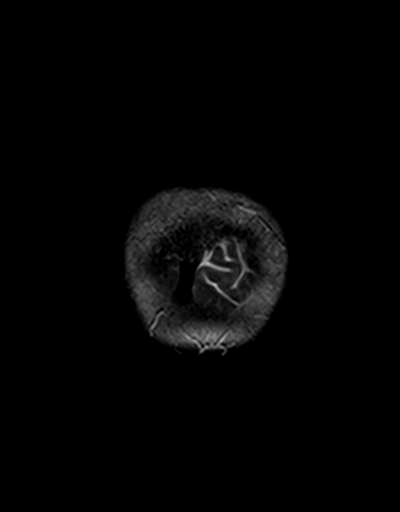

[48 of 48 positions shown; findings below may reference images not displayed]

FINDINGS: Brain: No acute infarct, acute hemorrhage or extra-axial collection.
Scattered foci of hyperintense T2-weighted signal within the white
matter in a nonspecific pattern that may be seen in the context of
migraine headaches, but is also seen in asymptomatic patients.
Normal volume of CSF spaces. No chronic microhemorrhage. Normal
midline structures.

Vascular: Normal flow voids.

Skull and upper cervical spine: Normal marrow signal.

Sinuses/Orbits: Negative.

Other: None.
IMPRESSION: 1. No acute intracranial abnormality.
2. Scattered foci of hyperintense T2-weighted signal within the
white matter in a nonspecific pattern that may be seen in the
context of migraine headaches, but is also seen in asymptomatic
patients.

## 2019-11-23 ENCOUNTER — Encounter: Payer: Self-pay | Admitting: Family Medicine

## 2019-11-23 NOTE — Telephone Encounter (Signed)
Please advise 

## 2019-11-29 ENCOUNTER — Encounter: Payer: Self-pay | Admitting: Family Medicine

## 2019-11-29 ENCOUNTER — Ambulatory Visit: Payer: 59 | Admitting: Family Medicine

## 2019-11-29 ENCOUNTER — Other Ambulatory Visit: Payer: Self-pay

## 2019-11-29 VITALS — BP 98/60 | HR 62 | Temp 97.8°F | Ht 63.75 in | Wt 133.4 lb

## 2019-11-29 DIAGNOSIS — F411 Generalized anxiety disorder: Secondary | ICD-10-CM | POA: Diagnosis not present

## 2019-11-29 DIAGNOSIS — G44229 Chronic tension-type headache, not intractable: Secondary | ICD-10-CM

## 2019-11-29 MED ORDER — BUSPIRONE HCL 7.5 MG PO TABS: 7.5000 mg | ORAL_TABLET | Freq: Three times a day (TID) | ORAL | 1 refills | Status: DC

## 2019-11-29 MED ORDER — AMITRIPTYLINE HCL 25 MG PO TABS: 25.0000 mg | ORAL_TABLET | Freq: Every day | ORAL | 1 refills | Status: DC

## 2019-11-29 NOTE — Progress Notes (Signed)
Patient: [name] [name] MRN: [id number] DOB: 1971-04-10 PCP: Orma Flaming, MD     Subjective:  Chief Complaint  Patient presents with  . Dizziness  . Headache    HPI: The patient is a 49 y.o. female who presents today for follow up of dizziness and headaches. MRI brain done recently with no acute findings.   Headaches On top of head, crown to the back. Sometimes feels hot. She gets other symptoms that seem to be associated with her headache. She will get lightheaded and feel like she is going to pass out. She denies any focal deficits. No nausea/vomiting/photophobia. Have been going on x 6 months. Has not kept headache log like I asked.   She has checked her blood pressure at times and it's always normal even when she has these episodes.   She doesn't eat much and is wondering if this has contributed to her symptoms. She only eats blueberries/yogurt a bagel and that is all she eats all day. Hasn't really kept a journal to see if this relates.   Review of Systems  Eyes: Negative for photophobia and visual disturbance.  Gastrointestinal: Negative for nausea and vomiting.  Neurological: Positive for light-headedness and headaches. Negative for dizziness.  Psychiatric/Behavioral: Negative for confusion. The patient is nervous/anxious.     Allergies Patient is allergic to ciprofloxacin.  Past Medical History Patient  has a past medical history of Anxiety, Depression, GERD (gastroesophageal reflux disease), History of back surgery (12/18/2018), and IBS (irritable bowel syndrome).  Surgical History Patient  has a past surgical history that includes Cesarean section; Dilation and curettage of uterus; Endometrial biopsy (09/19/12); Dilatation & currettage/hysteroscopy with novasure ablation (N/A, 10/31/2012); laparoscopy (Bilateral, 10/31/2012); Bilateral salpingectomy (Bilateral, 10/31/2012); cysto with hydrodistension (N/A, 10/31/2012); Breast enhancement surgery (04/12/2017); Augmentation  mammaplasty (Bilateral, 03/2017); and Back surgery (Bilateral, 12/18/2018).  Family History Pateint's family history includes Club foot in her son; Hypertension in her father and mother; Kidney disease in her father; Non-Hodgkin's lymphoma in her father; Stroke in her father.  Social History Patient  reports that she quit smoking about 6 years ago. Her smoking use included cigarettes. She has never used smokeless tobacco. She reports current alcohol use of about 5.0 standard drinks of alcohol per week. She reports that she does not use drugs.    Objective: Vitals:   11/29/19 1116  BP: 98/60  Pulse: 62  Temp: 97.8 F (36.6 C)  TempSrc: Temporal  SpO2: 99%  Weight: 133 lb 6.4 oz (60.5 kg)  Height: 5' 3.75" (1.619 m)    Body mass index is 23.08 kg/m.  Physical Exam Vitals reviewed.  Constitutional:      Appearance: She is well-developed and normal weight.  HENT:     Head: Normocephalic and atraumatic.  Eyes:     Extraocular Movements: Extraocular movements intact.     Pupils: Pupils are equal, round, and reactive to light.  Cardiovascular:     Rate and Rhythm: Normal rate and regular rhythm.     Heart sounds: Normal heart sounds.  Pulmonary:     Effort: Pulmonary effort is normal.     Breath sounds: Normal breath sounds.  Abdominal:     General: Bowel sounds are normal.     Palpations: Abdomen is soft.  Musculoskeletal:     Cervical back: Normal range of motion and neck supple.  Skin:    General: Skin is warm.     Capillary Refill: Capillary refill takes less than 2 seconds.  Neurological:  Mental Status: She is alert and oriented to person, place, and time.     Cranial Nerves: No cranial nerve deficit or facial asymmetry.     Sensory: No sensory deficit.     Deep Tendon Reflexes: Reflexes normal.  Psychiatric:        Mood and Affect: Mood normal.        Speech: Speech normal.        Behavior: Behavior normal.        Assessment/plan: 1. Chronic  tension-type headache, not intractable -MRI with no acute findings -she has not kept headache log and asked her again to do this.  -will check blood sugar to see if hypoglycemia contributing and really encouraged her to change her diet and eat more meals throughout day. Could be contributing to issue at hand.  -lexapro has made her headaches worse, taper off this. Trial of buspar for her anxiety, but headache also a side effect. She is to let me know.  -for tension headaches will do trial of elavil after tapered off lexapro.  -referral to headache clinic at this point.  -f/u with me in one month for anxiety medication change.  - AMB referral to headache clinic  2. GAD -tapering off lexapro since she knows this does make headaches worse. Continue klonopin prn.  -trial of buspar bid-tid, but discussed this can make headaches worse so she is to let me know.   This visit occurred during the SARS-CoV-2 public health emergency.  Safety protocols were in place, including screening questions prior to the visit, additional usage of staff PPE, and extensive cleaning of exam room while observing appropriate contact time as indicated for disinfecting solutions.     Return in about 1 month (around 12/30/2019) for anxiety .   Orma Flaming, MD [facility]   11/29/2019

## 2019-11-29 NOTE — Patient Instructions (Signed)
1) im going to stop your lexapro and start you on a drug called buspar for anxiety to see if this helps with headaches from the lexapro. Take one pill of the lexapro for a week and then stop. After this you can start the buspar. You can take this drug twice to three times a day. Max dosage is 30mg /day.   2) for the headache we are going to try a drug called elavil to help prevent the headache. Take this at night, but do not start until you are done with the lexparo and on the buspar.   3) referral to headache clinic.[facility] let me know if you don't hear from them.   4) try to eat more frequent meals and check your blood sugar as low blood sugar can make you feel really bad.

## 2019-12-07 ENCOUNTER — Ambulatory Visit: Payer: 59 | Admitting: Family Medicine

## 2019-12-12 ENCOUNTER — Other Ambulatory Visit: Payer: Self-pay | Admitting: Family Medicine

## 2020-01-22 ENCOUNTER — Other Ambulatory Visit: Payer: Self-pay | Admitting: Family Medicine

## 2020-01-22 MED ORDER — CLONAZEPAM 0.5 MG PO TABS: ORAL_TABLET | ORAL | 0 refills | Status: DC

## 2020-01-22 NOTE — Telephone Encounter (Signed)
Last refill: 12/12/19 #60, 0 Last OV: 11/29/19 dx. Dizziness and headache

## 2020-01-22 NOTE — Telephone Encounter (Signed)
.. °  LAST APPOINTMENT DATE: 12/12/2019   NEXT APPOINTMENT DATE:@Visit  date not found  MEDICATION:clonazePAM (KLONOPIN) 0.5 MG tablet  PHARMACY:[id number] - [place], [place] - [address] AT [address]  **Let patient know to contact pharmacy at the end of the day to make sure medication is ready. **  ** Please notify patient to allow 48-72 hours to process**  **Encourage patient to contact the pharmacy for refills or they can request refills through [facility]**  CLINICAL FILLS OUT ALL BELOW:   LAST REFILL:  QTY:  REFILL DATE:    OTHER COMMENTS:    Okay for refill?  Please advise

## 2020-01-22 NOTE — Telephone Encounter (Signed)
Last refill: 12/12/19 #60, 0 Last OV: 11/29/19 dx. Dizziness/headache

## 2020-01-23 MED ORDER — CLONAZEPAM 0.5 MG PO TABS: ORAL_TABLET | ORAL | 0 refills | Status: DC

## 2020-01-23 NOTE — Addendum Note (Signed)
Addended by: Orma Flaming on: 01/23/2020 09:12 AM   Modules accepted: Orders

## 2020-01-24 ENCOUNTER — Ambulatory Visit: Payer: 59 | Admitting: Family Medicine

## 2020-01-31 ENCOUNTER — Ambulatory Visit: Payer: 59 | Admitting: Family Medicine

## 2020-01-31 ENCOUNTER — Encounter: Payer: Self-pay | Admitting: Family Medicine

## 2020-01-31 ENCOUNTER — Other Ambulatory Visit: Payer: Self-pay

## 2020-01-31 VITALS — BP 100/68 | HR 78 | Temp 98.2°F | Ht 63.75 in | Wt 132.8 lb

## 2020-01-31 DIAGNOSIS — F411 Generalized anxiety disorder: Secondary | ICD-10-CM

## 2020-01-31 NOTE — Patient Instructions (Signed)
1) if you test positive, I need to know right away so I can set you up with infusion. We have here at [facility] I just have to call and get you set up with them.   2) I am encouraging everyone to have a covid kit at home that includes a pulse ox and nebulizer machine. If you get covid you have these two vital things to monitor oxygen and then I do a diluted hydrogen peroxide neb. You need to get the 3% food grade hydrogen peroxide and dilute 50-50 with distilled water or saline (example 3cc hydrogen peroxide with 3 cc water) twice a day.   ONLY use this if you get covid.   Also while covid is rampant you can take vitamins for prevention then if you get covid, I increase the vitamins and start ivermectin and possibly other steroid/inhaled steroids if needed. Below is prevention protocol.   1) vitamin D3 2000IU/day 2) vitamin C: 500-1047m twice a day 3) quercetin: 5068mdaily 4) melatonin 56m43mefore bedtime (may cause drowsiness)  Gargle mouthwash like scope/act/crest twice a day.   Call me right away if you get covid/set up telehealth appointment so we can discuss treatment.   So glad you are doing well! Aw

## 2020-01-31 NOTE — Progress Notes (Signed)
Patient: [name] [name] MRN: [id number] DOB: [birth year] PCP: Orma Flaming, MD     Subjective:  Chief Complaint  Patient presents with  . Anxiety    HPI: The patient is a 49 y.o. female who presents today for anxiety. She was on lexapro and buspar and was having dizziness and headaches. She was on buspar for only 2 weeks due to dizziness. She took herself off lexapro and all of her symptoms have seem to abated. She no longer has headaches or dizzy spells. She feels like her anxiety well controlled on the klonopin and she is very happy with the change.    Review of Systems  Constitutional: Negative for chills, fatigue and fever.  HENT: Negative for dental problem, ear pain, hearing loss and trouble swallowing.   Eyes: Negative for visual disturbance.  Respiratory: Negative for cough, chest tightness and shortness of breath.   Cardiovascular: Negative for chest pain, palpitations and leg swelling.  Gastrointestinal: Negative for abdominal pain, blood in stool, diarrhea and nausea.  Endocrine: Negative for cold intolerance, polydipsia, polyphagia and polyuria.  Genitourinary: Negative for dysuria and hematuria.  Musculoskeletal: Negative for arthralgias.  Skin: Negative for rash.  Neurological: Negative for dizziness and headaches.  Psychiatric/Behavioral: Negative for dysphoric mood and sleep disturbance. The patient is not nervous/anxious.     Allergies Patient is allergic to ciprofloxacin.  Past Medical History Patient  has a past medical history of Anxiety, Depression, GERD (gastroesophageal reflux disease), History of back surgery (12/18/2018), and IBS (irritable bowel syndrome).  Surgical History Patient  has a past surgical history that includes Cesarean section; Dilation and curettage of uterus; Endometrial biopsy (09/19/12); Dilatation & currettage/hysteroscopy with novasure ablation (N/A, 10/31/2012); laparoscopy (Bilateral, 10/31/2012); Bilateral salpingectomy (Bilateral,  10/31/2012); cysto with hydrodistension (N/A, 10/31/2012); Breast enhancement surgery (04/12/2017); Augmentation mammaplasty (Bilateral, 03/2017); and Back surgery (Bilateral, 12/18/2018).  Family History Pateint's family history includes Club foot in her son; Hypertension in her father and mother; Kidney disease in her father; Non-Hodgkin's lymphoma in her father; Stroke in her father.  Social History Patient  reports that she quit smoking about 7 years ago. Her smoking use included cigarettes. She has never used smokeless tobacco. She reports current alcohol use of about 5.0 standard drinks of alcohol per week. She reports that she does not use drugs.    Objective: Vitals:   01/31/20 1127  BP: 100/68  Pulse: 78  Temp: 98.2 F (36.8 C)  TempSrc: Temporal  SpO2: 98%  Weight: 132 lb 12.8 oz (60.2 kg)  Height: 5' 3.75" (1.619 m)    Body mass index is 22.97 kg/m.  Physical Exam Vitals reviewed.  Constitutional:      Appearance: Normal appearance. She is normal weight.  HENT:     Head: Normocephalic and atraumatic.  Cardiovascular:     Rate and Rhythm: Normal rate and regular rhythm.     Heart sounds: Normal heart sounds.  Pulmonary:     Effort: Pulmonary effort is normal.     Breath sounds: Normal breath sounds.  Abdominal:     General: Abdomen is flat. Bowel sounds are normal.     Palpations: Abdomen is soft.  Neurological:     General: No focal deficit present.     Mental Status: She is alert and oriented to person, place, and time.  Psychiatric:        Mood and Affect: Mood normal.        Behavior: Behavior normal.        GAD 7 :  Generalized Anxiety Score 01/31/2020 10/22/2019 02/19/2019 09/01/2018  Nervous, Anxious, on Edge 3 3 3 3   Control/stop worrying 2 3 3 3   Worry too much - different things 2 3 3 3   Trouble relaxing 2 3 3 3   Restless 1 2 3 2   Easily annoyed or irritable 2 2 3 3   Afraid - awful might happen 1 3 1 2   Total GAD 7 Score 13 19 19 19   Anxiety  Difficulty - Very difficult Not difficult at all Somewhat difficult     Assessment/plan: 1. GAD (generalized anxiety disorder) GAD7 significantly improved despite being off all medication except her klonopin. All of her symptoms have resolved. She is no longer dizzy or having headaches. im very happy that she is feeling better and even happier her anxiety is controlled. F/u with me for regular annual or as needed.    This visit occurred during the SARS-CoV-2 public health emergency.  Safety protocols were in place, including screening questions prior to the visit, additional usage of staff PPE, and extensive cleaning of exam room while observing appropriate contact time as indicated for disinfecting solutions.     Return if symptoms worsen or fail to improve.   Orma Flaming, MD [place]   01/31/2020

## 2020-03-05 ENCOUNTER — Other Ambulatory Visit: Payer: Self-pay | Admitting: Family Medicine

## 2020-03-06 MED ORDER — CLONAZEPAM 0.5 MG PO TABS: ORAL_TABLET | ORAL | 0 refills | Status: DC

## 2020-03-31 ENCOUNTER — Encounter: Payer: Self-pay | Admitting: Family Medicine

## 2020-04-02 ENCOUNTER — Ambulatory Visit: Payer: 59 | Admitting: Family Medicine

## 2020-04-02 ENCOUNTER — Other Ambulatory Visit: Payer: Self-pay

## 2020-04-02 ENCOUNTER — Encounter: Payer: Self-pay | Admitting: Family Medicine

## 2020-04-02 VITALS — BP 112/81 | HR 67 | Temp 98.2°F | Ht 63.75 in | Wt 130.8 lb

## 2020-04-02 DIAGNOSIS — J40 Bronchitis, not specified as acute or chronic: Secondary | ICD-10-CM

## 2020-04-02 MED ORDER — ALBUTEROL SULFATE HFA 108 (90 BASE) MCG/ACT IN AERS: 1.0000 | INHALATION_SPRAY | Freq: Four times a day (QID) | RESPIRATORY_TRACT | 0 refills | Status: DC | PRN

## 2020-04-02 MED ORDER — GUAIFENESIN-CODEINE 100-10 MG/5ML PO SOLN: 10.0000 mL | Freq: Four times a day (QID) | ORAL | 0 refills | Status: DC | PRN

## 2020-04-02 MED ORDER — BENZONATATE 200 MG PO CAPS
200.0000 mg | ORAL_CAPSULE | Freq: Two times a day (BID) | ORAL | 1 refills | Status: DC | PRN
Start: 2020-04-02 — End: 2021-01-05

## 2020-04-02 NOTE — Progress Notes (Signed)
Patient: [name] [name] MRN: [id number] DOB: 02-12-1971 PCP: Orma Flaming, MD     Subjective:  Chief Complaint  Patient presents with   Cough    HPI: The patient is a 49 y.o. female who presents today for a dry cough starting Thursday night. She got her flu shot the previous Sunday. She complains of post nasal drip and mild sore throat.  She has taken DayQuil, NyQuil, cough drops, and honey but has not subsided. She also has had 2 at home negative Covid results. She states the cough is worse at night due to the drainage. She is not on any nasal spray. No fever/chills, she does have some shortness of breath with the cough. She states she has violent coughing at night. No other sick contacts. She has had covid and has had her vaccines.   Review of Systems  Constitutional: Negative for fever.  HENT: Positive for congestion, postnasal drip and sore throat. Negative for sinus pressure and sinus pain.   Respiratory: Positive for cough. Negative for chest tightness and wheezing.   Cardiovascular: Negative for chest pain and palpitations.  Gastrointestinal: Negative for abdominal pain, diarrhea and nausea.  Neurological: Negative for dizziness and headaches.    Allergies Patient is allergic to ciprofloxacin.  Past Medical History Patient  has a past medical history of Anxiety, Depression, GERD (gastroesophageal reflux disease), History of back surgery (12/18/2018), and IBS (irritable bowel syndrome).  Surgical History Patient  has a past surgical history that includes Cesarean section; Dilation and curettage of uterus; Endometrial biopsy (09/19/12); Dilatation & currettage/hysteroscopy with novasure ablation (N/A, 10/31/2012); laparoscopy (Bilateral, 10/31/2012); Bilateral salpingectomy (Bilateral, 10/31/2012); cysto with hydrodistension (N/A, 10/31/2012); Breast enhancement surgery (04/12/2017); Augmentation mammaplasty (Bilateral, 03/2017); and Back surgery (Bilateral, 12/18/2018).  Family  History Pateint's family history includes Club foot in her son; Hypertension in her father and mother; Kidney disease in her father; Non-Hodgkin's lymphoma in her father; Stroke in her father.  Social History Patient  reports that she quit smoking about 7 years ago. Her smoking use included cigarettes. She has never used smokeless tobacco. She reports current alcohol use of about 5.0 standard drinks of alcohol per week. She reports that she does not use drugs.    Objective: Vitals:   04/02/20 1307  BP: 112/81  Pulse: 67  Temp: 98.2 F (36.8 C)  TempSrc: Temporal  SpO2: 99%  Weight: 130 lb 12.8 oz (59.3 kg)  Height: 5' 3.75" (1.619 m)    Body mass index is 22.63 kg/m.  Physical Exam Vitals reviewed.  Constitutional:      General: She is not in acute distress.    Appearance: She is well-developed. She is not toxic-appearing.  HENT:     Head: Normocephalic and atraumatic.  Eyes:     Extraocular Movements: Extraocular movements intact.     Pupils: Pupils are equal, round, and reactive to light.  Cardiovascular:     Rate and Rhythm: Normal rate and regular rhythm.     Heart sounds: Normal heart sounds.  Pulmonary:     Effort: Pulmonary effort is normal.     Breath sounds: Normal breath sounds. No decreased breath sounds, wheezing, rhonchi or rales.  Abdominal:     General: Bowel sounds are normal.     Palpations: Abdomen is soft.  Musculoskeletal:     Cervical back: Normal range of motion and neck supple.  Lymphadenopathy:     Cervical: No cervical adenopathy.  Skin:    General: Skin is warm.  Capillary Refill: Capillary refill takes less than 2 seconds.  Neurological:     General: No focal deficit present.     Mental Status: She is alert.        Assessment/plan: 1. Bronchitis Conservative therapy with cool mist humidifier at night, rest/fluids, and honey daily. Recommend 1 tablespoon/day. Also recommended over the counter anti tussive medication: robitussin DM  during the day and could do a nyquil at night. Sending in tessalon pearls prn for cough as well as codeine cough syrup. Drowsy precautions given. Since has some SOB during cough will also do albuterol inhaler prn.  Discussed viral in nature and no indication for antibiotics at this point. Precautions given for worsening symptoms, fever, shortness of breath to let us know immediatly. CXR ordered if symptoms persist or get worse.   -pain with coughing: likely pleuritis. nsaids prn.   This visit occurred during the SARS-CoV-2 public health emergency.  Safety protocols were in place, including screening questions prior to the visit, additional usage of staff PPE, and extensive cleaning of exam room while observing appropriate contact time as indicated for disinfecting solutions.     Return if symptoms worsen or fail to improve.   Orma Flaming, MD [place]   04/02/2020

## 2020-04-02 NOTE — Patient Instructions (Signed)
-tessalon pearls as needed for cough -codeine cough syrup at night -cool mist humidifer -continue honey daily.  -sending albuterol inhaler to use as needed for shortness of breath -flonase!!! Big one for post nasal drip. Use at night.   -continue vitamin C, D, zinc and tumeric.    -fever/chills, change call me and get CXR.   I have ordered xrays for you. At this time we do not have xrays in our clinic. You will have to go to our [place] [facility]. The address is [address]  xray is located in the basement.  Hours of operation are M-F 8:30am to 5:00pm.  Closed for lunch between 12:30 and 1:00pm.      Acute Bronchitis, Adult  Acute bronchitis is when air tubes in the lungs (bronchi) suddenly get swollen. The condition can make it hard for you to breathe. In adults, acute bronchitis usually goes away within 2 weeks. A cough caused by bronchitis may last up to 3 weeks. Smoking, allergies, and asthma can make the condition worse. What are the causes? This condition is caused by:  Cold and flu viruses. The most common cause of this condition is the virus that causes the common cold.  Bacteria.  Substances that irritate the lungs, including: ? Smoke from cigarettes and other types of tobacco. ? Dust and pollen. ? Fumes from chemicals, gases, or burned fuel. ? Other materials that pollute indoor or outdoor air.  Close contact with someone who has acute bronchitis. What increases the risk? The following factors may make you more likely to develop this condition:  A weak body's defense system. This is also called the immune system.  Any condition that affects your lungs and breathing, such as asthma. What are the signs or symptoms? Symptoms of this condition include:  A cough.  Coughing up clear, yellow, or green mucus.  Wheezing.  Chest congestion.  Shortness of breath.  A fever.  Body aches.  Chills.  A sore throat. How is this treated? Acute bronchitis may go  away over time without treatment. Your doctor may recommend:  Drinking more fluids.  Taking a medicine for a fever or cough.  Using a device that gets medicine into your lungs (inhaler).  Using a vaporizer or a humidifier. These are machines that add water or moisture in the air to help with coughing and poor breathing. Follow these instructions at home:  Activity  Get a lot of rest.  Avoid places where there are fumes from chemicals.  Return to your normal activities as told by your doctor. Ask your doctor what activities are safe for you. Lifestyle  Drink enough fluids to keep your pee (urine) pale yellow.  Do not drink alcohol.  Do not use any products that contain nicotine or tobacco, such as cigarettes, e-cigarettes, and chewing tobacco. If you need help quitting, ask your doctor. Be aware that: ? Your bronchitis will get worse if you smoke or breathe in other people's smoke (secondhand smoke). ? Your lungs will heal faster if you quit smoking. General instructions  Take over-the-counter and prescription medicines only as told by your doctor.  Use an inhaler, cool mist vaporizer, or humidifier as told by your doctor.  Rinse your mouth often with salt water. To make salt water, dissolve -1 tsp (3-6 g) of salt in 1 cup (237 mL) of warm water.  Keep all follow-up visits as told by your doctor. This is important. How is this prevented? To lower your risk of getting  this condition again:  Wash your hands often with soap and water. If soap and water are not available, use hand sanitizer.  Avoid contact with people who have cold symptoms.  Try not to touch your mouth, nose, or eyes with your hands.  Make sure to get the flu shot every year. Contact a doctor if:  Your symptoms do not get better in 2 weeks.  You vomit more than once or twice.  You have symptoms of loss of fluid from your body (dehydration). These include: ? Dark urine. ? Dry skin or  eyes. ? Increased thirst. ? Headaches. ? Confusion. ? Muscle cramps. Get help right away if:  You cough up blood.  You have chest pain.  You have very bad shortness of breath.  You become dehydrated.  You faint or keep feeling like you are going to faint.  You keep vomiting.  You have a very bad headache.  Your fever or chills get worse. These symptoms may be an emergency. Do not wait to see if the symptoms will go away. Get medical help right away. Call your local emergency services (911 in the [place] Do not drive yourself to the hospital. Summary  Acute bronchitis is when air tubes in the lungs (bronchi) suddenly get swollen. In adults, acute bronchitis usually goes away within 2 weeks.  Take over-the-counter and prescription medicines only as told by your doctor.  Drink enough fluid to keep your pee (urine) pale yellow.  Contact a doctor if your symptoms do not improve after 2 weeks of treatment.  Get help right away if you cough up blood, faint, or have chest pain or shortness of breath. This information is not intended to replace advice given to you by your health care provider. Make sure you discuss any questions you have with your health care provider. Document Revised: 12/08/2018 Document Reviewed: 12/08/2018 Elsevier Patient Education  [employer].

## 2020-04-28 ENCOUNTER — Other Ambulatory Visit: Payer: Self-pay

## 2020-04-28 ENCOUNTER — Ambulatory Visit (INDEPENDENT_AMBULATORY_CARE_PROVIDER_SITE_OTHER)
Admission: RE | Admit: 2020-04-28 | Discharge: 2020-04-28 | Disposition: A | Discharge status: Home / Self Care | Payer: 59 | Source: Ambulatory Visit | Attending: Family Medicine | Admitting: Family Medicine

## 2020-04-28 ENCOUNTER — Telehealth: Payer: Self-pay

## 2020-04-28 DIAGNOSIS — J40 Bronchitis, not specified as acute or chronic: Secondary | ICD-10-CM

## 2020-04-28 NOTE — Telephone Encounter (Signed)
Order in.  Orma Flaming, MD [place]

## 2020-04-28 NOTE — Telephone Encounter (Signed)
Someone called from [facility] and they stated patient was needing a chest x-ray and there is no orders asked if Dr.Wolfe could place the orders.

## 2020-04-29 ENCOUNTER — Encounter: Payer: Self-pay | Admitting: Family Medicine

## 2020-04-30 ENCOUNTER — Other Ambulatory Visit: Payer: Self-pay | Admitting: Family Medicine

## 2020-04-30 DIAGNOSIS — Z1231 Encounter for screening mammogram for malignant neoplasm of breast: Secondary | ICD-10-CM

## 2020-05-22 ENCOUNTER — Other Ambulatory Visit: Payer: Self-pay | Admitting: Family Medicine

## 2020-06-17 ENCOUNTER — Ambulatory Visit
Admission: RE | Admit: 2020-06-17 | Discharge: 2020-06-17 | Disposition: A | Discharge status: Home / Self Care | Payer: 59 | Source: Ambulatory Visit | Attending: Family Medicine | Admitting: Family Medicine

## 2020-06-17 ENCOUNTER — Other Ambulatory Visit: Payer: Self-pay

## 2020-06-17 DIAGNOSIS — Z1231 Encounter for screening mammogram for malignant neoplasm of breast: Secondary | ICD-10-CM

## 2020-06-17 IMAGING — MG DIGITAL SCREENING BREAST BILAT IMPLANT W/ TOMO W/ CAD
8 of 14 series · 8 of 34 positions shown · non-contrast
Comparison: Previous exam(s).

CLINICAL DATA: Screening.

EXAM:
DIGITAL SCREENING BILATERAL MAMMOGRAM WITH IMPLANTS, CAD AND TOMO
The patient has retropectoral implants. Standard and implant
displaced views were performed.

[R CC]
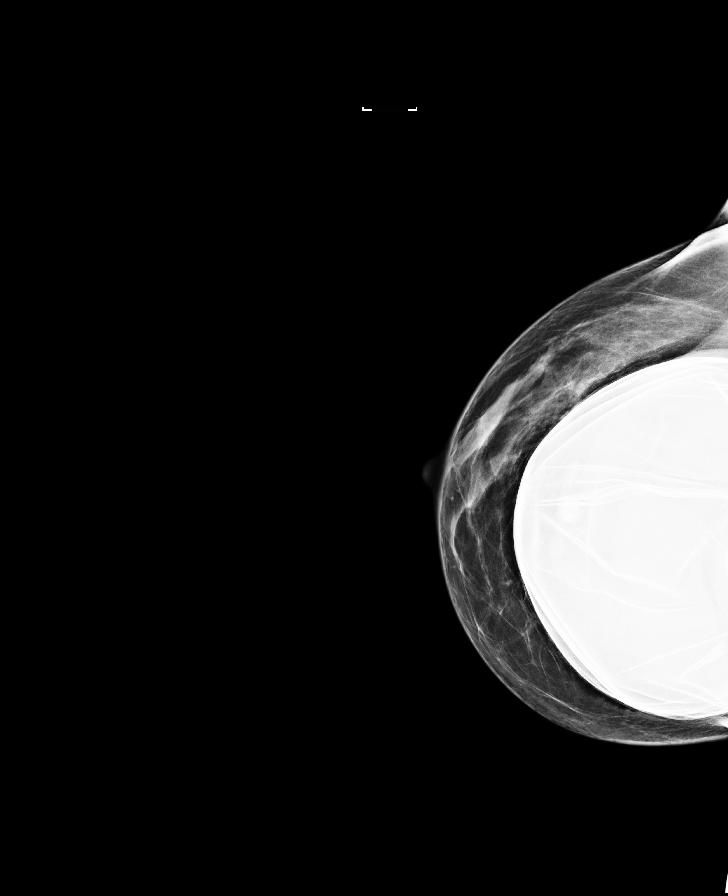

[L MLO]
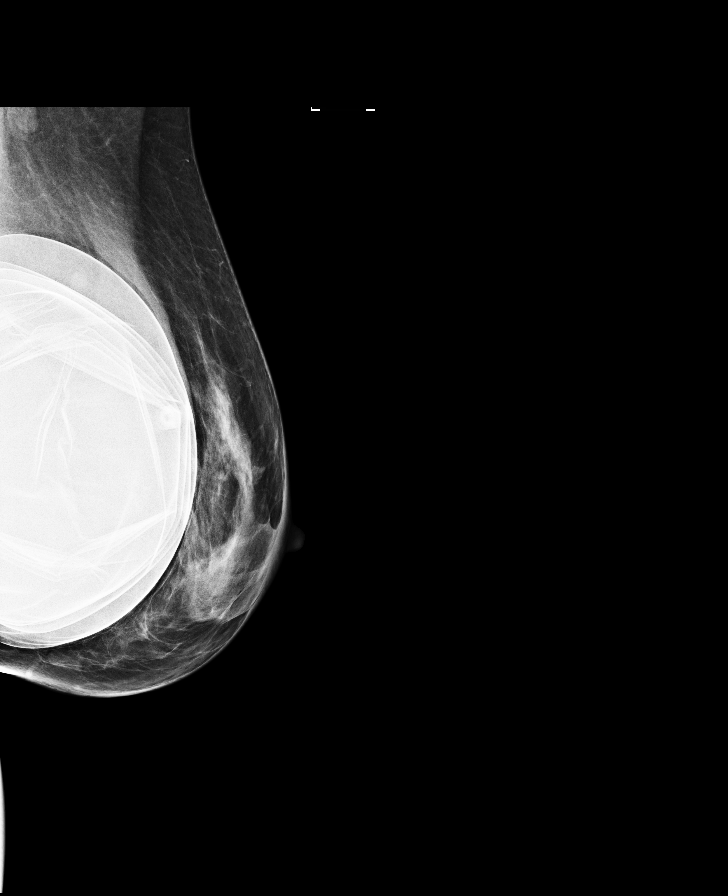

[R MLO]
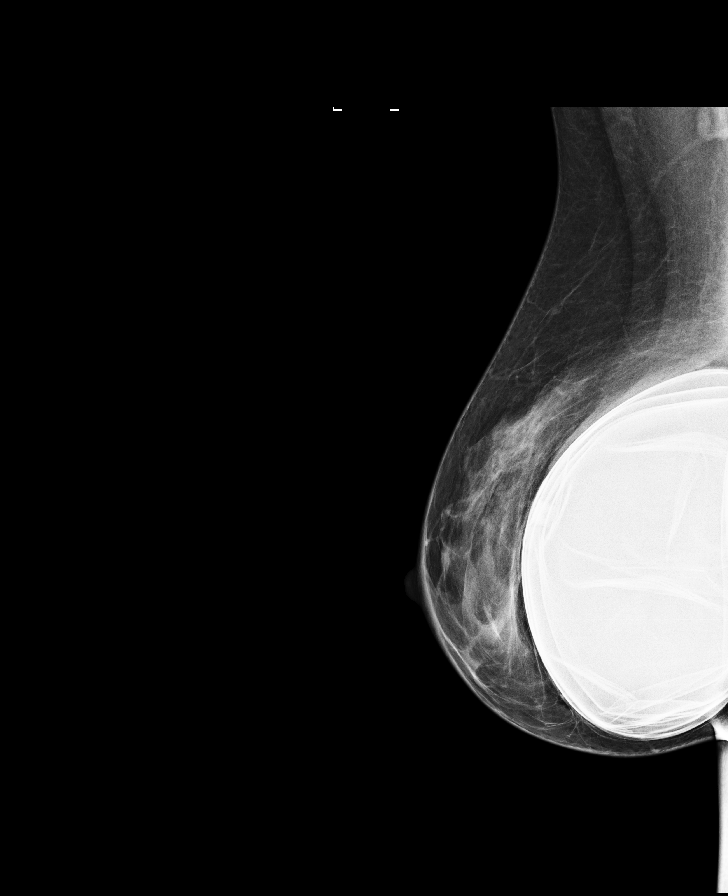

[L CC]
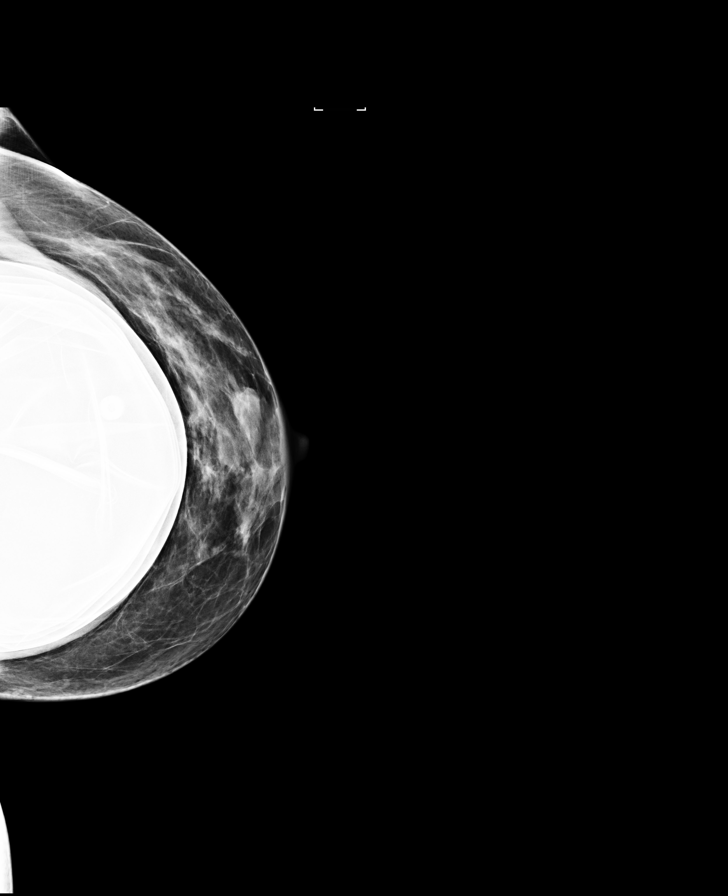

[L CC synth-2D]
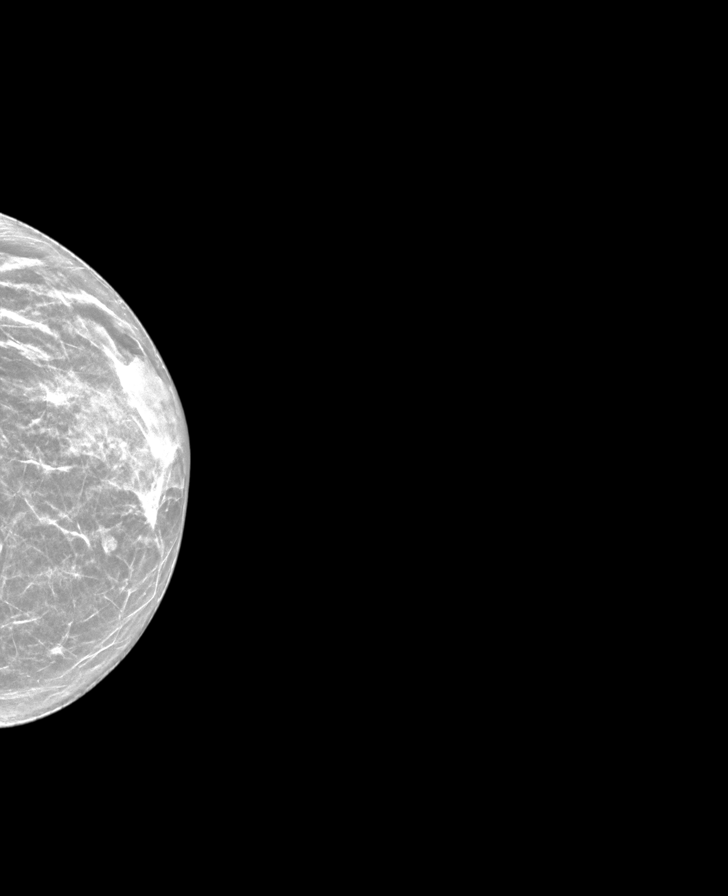

[L MLO synth-2D]
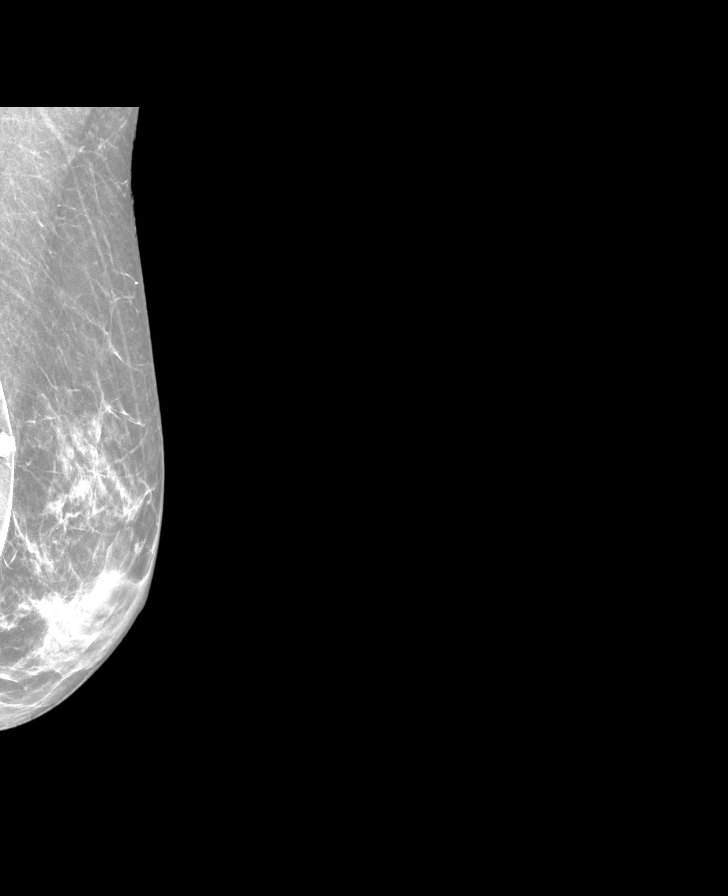

[R CC synth-2D]
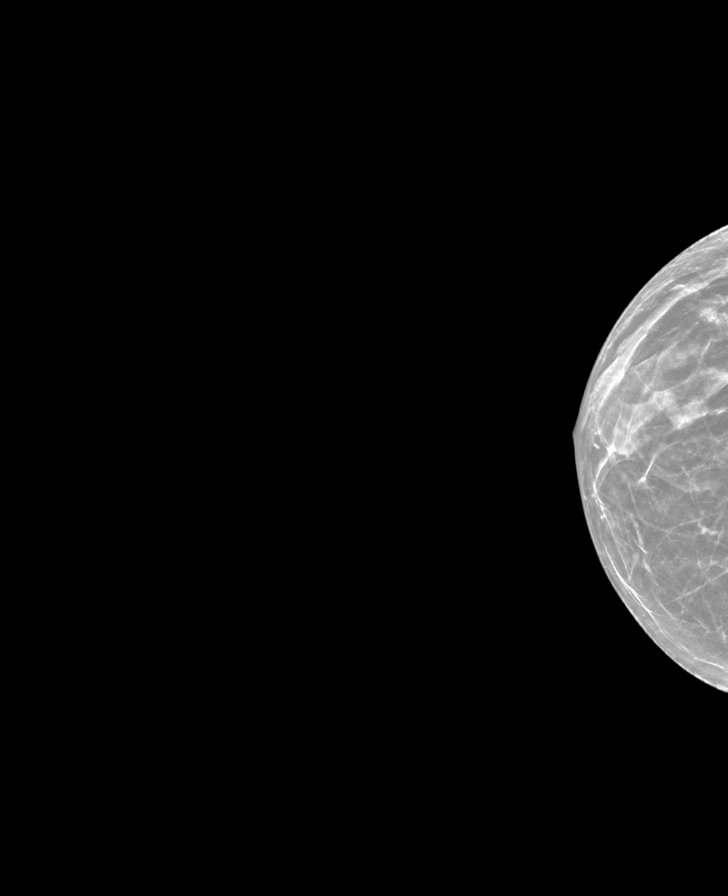

[R MLO synth-2D]
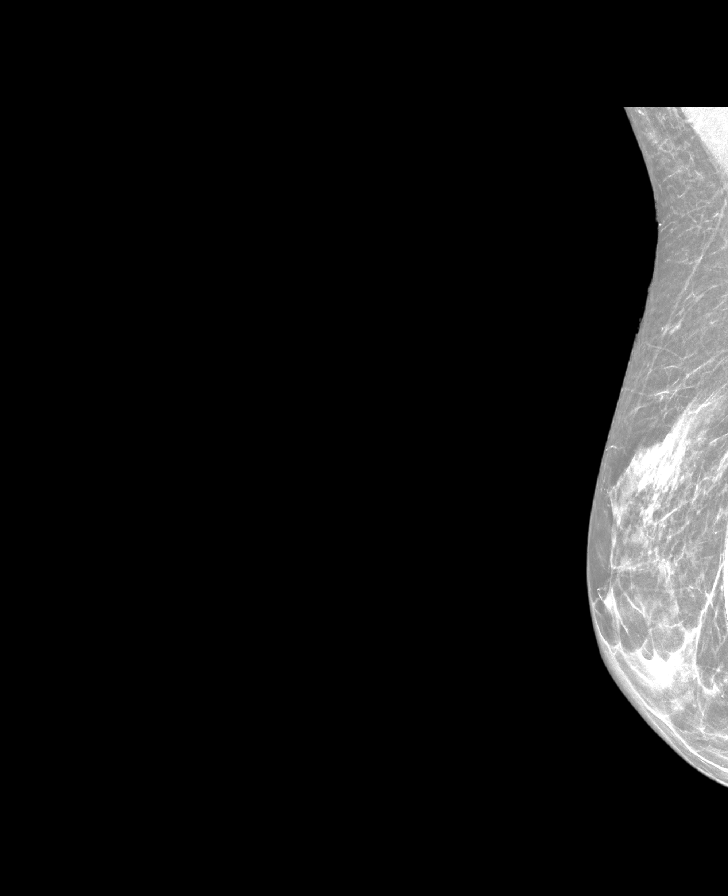

[8 of 34 positions shown; findings below may reference images not displayed]

ACR Breast Density Category c: The breast tissue is heterogeneously
dense, which may obscure small masses.
FINDINGS: There are no findings suspicious for malignancy. Images were
processed with CAD.
IMPRESSION: No mammographic evidence of malignancy. A result letter of this
screening mammogram will be mailed directly to the patient.

RECOMMENDATION:
Screening mammogram in one year. (Code:[5L])

BI-RADS CATEGORY  1:  Negative.

## 2020-07-01 ENCOUNTER — Other Ambulatory Visit: Payer: Self-pay | Admitting: Family Medicine

## 2020-07-02 NOTE — Telephone Encounter (Signed)
Clonazepam last rx 05/22/20 #60 LOV: 04/02/20 Bronchitis, 01/31/20-Anxiety

## 2020-08-26 ENCOUNTER — Ambulatory Visit: Payer: 59

## 2020-09-12 ENCOUNTER — Other Ambulatory Visit: Payer: Self-pay | Admitting: Family Medicine

## 2020-09-15 ENCOUNTER — Encounter: Payer: Self-pay | Admitting: Family Medicine

## 2020-10-09 ENCOUNTER — Encounter: Payer: Self-pay | Admitting: Family Medicine

## 2020-11-17 ENCOUNTER — Other Ambulatory Visit: Payer: Self-pay | Admitting: Family Medicine

## 2020-11-17 NOTE — Telephone Encounter (Signed)
Pt requesting refill for Clonazepam 0.5 mg tab LOV: 11/3/2021[facility] Next Vist: 01/05/2021- [place] Last refill: 10/17/2020  Approve?

## 2021-01-05 ENCOUNTER — Other Ambulatory Visit: Payer: Self-pay

## 2021-01-05 ENCOUNTER — Encounter: Payer: 59 | Admitting: Physician Assistant

## 2021-01-05 ENCOUNTER — Ambulatory Visit (INDEPENDENT_AMBULATORY_CARE_PROVIDER_SITE_OTHER): Payer: 59 | Admitting: Physician Assistant

## 2021-01-05 VITALS — BP 114/79 | HR 83 | Temp 100.0°F | Ht 63.75 in | Wt 136.2 lb

## 2021-01-05 DIAGNOSIS — T63441A Toxic effect of venom of bees, accidental (unintentional), initial encounter: Secondary | ICD-10-CM | POA: Diagnosis not present

## 2021-01-05 DIAGNOSIS — F411 Generalized anxiety disorder: Secondary | ICD-10-CM | POA: Diagnosis not present

## 2021-01-05 DIAGNOSIS — M7918 Myalgia, other site: Secondary | ICD-10-CM

## 2021-01-05 MED ORDER — TRIAMCINOLONE ACETONIDE 0.1 % EX CREA: 1.0000 "application " | TOPICAL_CREAM | Freq: Three times a day (TID) | CUTANEOUS | 0 refills | Status: AC

## 2021-01-05 NOTE — Patient Instructions (Signed)
Good to meet you today!  Please use the cream as directed over the local reaction where the bee stung you. Keep elevated and cool compresses to area.  Sports medicine will contact you for appointment.  Call pharmacy for refills. I will see you back in 6 months.

## 2021-01-05 NOTE — Progress Notes (Signed)
Established Patient Office Visit  Subjective:  Patient ID: [name] [name], female    DOB: Sep 09, 1970  Age: 50 y.o. MRN: [id number]  CC:  Chief Complaint  Patient presents with   Rash    HPI [name] [name] presents for rash to front of left lower leg. 01/02/21 she was stung by some type of bee. Denies any systemic symptoms. She has tried Benadryl po and some hydrocortisone 1% to area without much relief.  Pt also complains of left upper back / shoulder pain. Ongoing since Nov 2021. Tender to palpation. She did have a CXR done which was negative. Massages do help somewhat.  Also has hx of GAD. Controlled with Klonopin 0.5 mg 1-2x daily. Not due for refill yet.    Past Medical History:  Diagnosis Date   Anxiety    Depression    GERD (gastroesophageal reflux disease)    History of back surgery 12/18/2018   IBS (irritable bowel syndrome)     Past Surgical History:  Procedure Laterality Date   AUGMENTATION MAMMAPLASTY Bilateral 03/2017   BACK SURGERY Bilateral 12/18/2018   BILATERAL SALPINGECTOMY Bilateral 10/31/2012   Procedure: BILATERAL SALPINGECTOMY;  Surgeon: Lyman Speller, MD;  Location: [facility];  Service: Gynecology;  Laterality: Bilateral;   BREAST ENHANCEMENT SURGERY  04/12/2017   CESAREAN SECTION     CYSTO WITH HYDRODISTENSION N/A 10/31/2012   Procedure: CYSTOSCOPY/HYDRODISTENSION;  Surgeon: Reece Packer, MD;  Location: [facility];  Service: Urology;  Laterality: N/A;   DILATION AND CURETTAGE OF UTERUS     DILITATION & CURRETTAGE/HYSTROSCOPY WITH NOVASURE ABLATION N/A 10/31/2012   Procedure: DILATATION & CURETTAGE/HYSTEROSCOPY WITH NOVASURE ABLATION;  Surgeon: Lyman Speller, MD;  Location: [facility];  Service: Gynecology;  Laterality: N/A;  Dr Suzanne Boron Diarmid to start case with a cysto at beginning of case while patient under anesthesia.   ENDOMETRIAL BIOPSY  09/19/12   neg   LAPAROSCOPY Bilateral 10/31/2012   Procedure: LAPAROSCOPY OPERATIVE;  Surgeon: Lyman Speller, MD;  Location: [facility];  Service: Gynecology;  Laterality: Bilateral;    Family History  Problem Relation Age of Onset   Stroke Father    Non-Hodgkin's lymphoma Father    Hypertension Father    Kidney disease Father    Hypertension Mother    Club foot Son     Social History   Socioeconomic History   Marital status: Divorced    Spouse name: Not on file   Number of children: Not on file   Years of education: Not on file   Highest education level: Not on file  Occupational History   Not on file  Tobacco Use   Smoking status: Former    Types: Cigarettes    Quit date: 01/15/2013    Years since quitting: 7.9   Smokeless tobacco: Never  Vaping Use   Vaping Use: Former   Quit date: 07/18/2018  Substance and Sexual Activity   Alcohol use: Yes    Alcohol/week: 5.0 standard drinks    Types: 5 Standard drinks or equivalent per week   Drug use: No   Sexual activity: Yes    Partners: Male    Birth control/protection: Surgical    Comment: BSO  Other Topics Concern   Not on file  Social History Narrative   Married   1 son (2003)   Engineer, maintenance (IT)         Social Determinants of Health   Financial Resource Strain: Not on file  Food Insecurity: Not on file  Transportation Needs: Not on file  Physical Activity: Not on file  Stress: Not on file  Social Connections: Not on file  Intimate Partner Violence: Not on file    Outpatient Medications Prior to Visit  Medication Sig Dispense Refill   acetaminophen (TYLENOL) 325 MG tablet Take 650 mg by mouth every 6 (six) hours as needed for mild pain.     clobetasol ointment (TEMOVATE) 7.32 % Apply 1 application topically 2 (two) times daily. 30 g 1   clonazePAM (KLONOPIN) 0.5 MG tablet TAKE 1 TABLET BY MOUTH TWICE DAILY AS NEEDED ANXIETY 60 tablet 1   VITAMIN D PO Take by mouth.     albuterol (VENTOLIN HFA) 108 (90 Base) MCG/ACT inhaler Inhale 1 puff into the lungs every 6 (six) hours as needed for wheezing or shortness of breath. 1  each 0   benzonatate (TESSALON) 200 MG capsule Take 1 capsule (200 mg total) by mouth 2 (two) times daily as needed for cough. 30 capsule 1   guaiFENesin-codeine 100-10 MG/5ML syrup Take 10 mLs by mouth every 6 (six) hours as needed for cough. 120 mL 0   NONFORMULARY OR COMPOUNDED ITEM Estradiol 0.25 / testosterone 0.1m/ml intravaginal cream.  161mpv 2 to 3 times weekly.  Dispense 3 month supply/1RF 1 each 1   No facility-administered medications prior to visit.    Allergies  Allergen Reactions   Ciprofloxacin Nausea Only    ROS Review of Systems REFER TO HPI FOR PERTINENT POSITIVES AND NEGATIVES    Objective:    Physical Exam Vitals and nursing note reviewed.  Constitutional:      General: She is not in acute distress.    Appearance: Normal appearance. She is normal weight.  HENT:     Head: Normocephalic.     Right Ear: External ear normal.     Left Ear: External ear normal.     Nose: Nose normal.     Mouth/Throat:     Mouth: Mucous membranes are moist.  Eyes:     Extraocular Movements: Extraocular movements intact.     Conjunctiva/sclera: Conjunctivae normal.     Pupils: Pupils are equal, round, and reactive to light.  Cardiovascular:     Rate and Rhythm: Normal rate and regular rhythm.     Pulses: Normal pulses.     Heart sounds: No murmur heard. Pulmonary:     Effort: Pulmonary effort is normal.     Breath sounds: Normal breath sounds.  Abdominal:     General: Abdomen is flat. Bowel sounds are normal.     Palpations: Abdomen is soft.     Tenderness: There is no abdominal tenderness.  Musculoskeletal:        General: Normal range of motion.       Arms:     Cervical back: Normal range of motion.  Skin:    General: Skin is warm.     Comments: Left shin there is a 3 in x 2 in macular erythematous patch with minimal surrounding edema. Homan's sign negative.   Neurological:     General: No focal deficit present.     Mental Status: She is alert and oriented to  person, place, and time.     Gait: Gait normal.  Psychiatric:        Mood and Affect: Mood normal.        Behavior: Behavior normal.    BP 114/79   Pulse 83   Temp 100 F (37.8 C)   Ht 5' 3.75" (  1.619 m)   Wt 136 lb 4 oz (61.8 kg)   SpO2 98%   BMI 23.57 kg/m  Wt Readings from Last 3 Encounters:  01/05/21 136 lb 4 oz (61.8 kg)  04/02/20 130 lb 12.8 oz (59.3 kg)  01/31/20 132 lb 12.8 oz (60.2 kg)     Health Maintenance Due  Topic Date Due   Hepatitis C Screening  Never done   Zoster Vaccines- Shingrix (1 of 2) Never done   COVID-19 Vaccine (3 - Booster for Pfizer series) 04/10/2020   INFLUENZA VACCINE  12/29/2020    There are no preventive care reminders to display for this patient.  Lab Results  Component Value Date   TSH 0.99 10/22/2019   Lab Results  Component Value Date   WBC 4.2 10/22/2019   HGB 12.5 10/22/2019   HCT 36.7 10/22/2019   MCV 94.8 10/22/2019   PLT 206.0 10/22/2019   Lab Results  Component Value Date   NA 139 10/22/2019   K 4.2 10/22/2019   CO2 26 10/22/2019   GLUCOSE 85 10/22/2019   BUN 13 10/22/2019   CREATININE 0.66 10/22/2019   BILITOT 0.6 10/22/2019   ALKPHOS 77 10/22/2019   AST 27 10/22/2019   ALT 25 10/22/2019   PROT 6.8 10/22/2019   ALBUMIN 4.4 10/22/2019   CALCIUM 9.3 10/22/2019   ANIONGAP 9 07/01/2016   GFR 94.34 10/22/2019   Lab Results  Component Value Date   CHOL 190 10/22/2019   Lab Results  Component Value Date   HDL 69.40 10/22/2019   Lab Results  Component Value Date   LDLCALC 106 (H) 10/22/2019   Lab Results  Component Value Date   TRIG 73.0 10/22/2019   Lab Results  Component Value Date   CHOLHDL 3 10/22/2019   No results found for: HGBA1C    Assessment & Plan:   Problem List Items Addressed This Visit   None   No orders of the defined types were placed in this encounter.   Follow-up: No follow-ups on file.   1. Bee sting reaction, accidental or unintentional, initial  encounter Triamcinolone cream as directed Ice, elevate  Recheck if worse or no imp  2. Rhomboid muscle pain Ongoing close to one year now. Seems MSK in nature. May benefit from TPI. Will refer to sports med.   3. GAD (generalized anxiety disorder) GAD 7 : Generalized Anxiety Score 01/05/2021 01/31/2020 10/22/2019 02/19/2019  Nervous, Anxious, on Edge _0 Control/stop worrying _1 Worry too much - different things _2 Trouble relaxing - _3 Restless _4 Easily annoyed or irritable _5 Afraid - awful might happen _6 Total GAD 7 Score - _7 Anxiety Difficulty Very difficult - Very difficult Not difficult at all   Stable on Klonopin 0.5 mg BID. No red flags on PDMP. Will call for refills. Contract signed today.       Karem Tomaso M Genevra Orne, PA-C

## 2021-01-13 ENCOUNTER — Ambulatory Visit: Payer: 59 | Admitting: Family Medicine

## 2021-01-13 ENCOUNTER — Other Ambulatory Visit: Payer: Self-pay

## 2021-01-13 ENCOUNTER — Encounter: Payer: Self-pay | Admitting: Family Medicine

## 2021-01-13 VITALS — BP 104/78 | HR 78 | Ht 63.75 in | Wt 138.2 lb

## 2021-01-13 DIAGNOSIS — M7918 Myalgia, other site: Secondary | ICD-10-CM | POA: Diagnosis not present

## 2021-01-13 NOTE — Progress Notes (Signed)
Subjective:    I'm seeing this patient as a consultation for: [name] Allwardt, PA-C. Note will be routed back to referring provider/PCP.  CC: Left-sided upper back pain  I, [name] [name], LAT, ATC, am serving as scribe for Dr. Lynne Leader.  HPI: Pt is a 50 y/o female c/o L-sided upper/scapular pain x approximately one year w/ no known MOI. Pt locates pain to her L inferior angle of the scapula that radiates into her L post ribs.  She had a lumbar discectomy L3 in summer 2020.  She works as a Engineer, maintenance (IT).  She thinks her desk chair may be a little bit too short and she does note that she tends to use her left arm to hold documents down when she is doing her job and that arm positioning is painful into her left rhomboid and chest wall region.  Radiates: yes into her L post-lat ribcage Aggravates: laying supine and L side Treatments tried: massage; Advil; massage gun; Biofreeze; stretching   Past medical history, Surgical history, Family history, Social history, Allergies, and medications have been entered into the medical record, reviewed.   Review of Systems: No new headache, visual changes, nausea, vomiting, diarrhea, constipation, dizziness, abdominal pain, skin rash, fevers, chills, night sweats, weight loss, swollen lymph nodes, body aches, joint swelling, muscle aches, chest pain, shortness of breath, mood changes, visual or auditory hallucinations.   Objective:    Vitals:   01/13/21 1523  BP: 104/78  Pulse: 78  SpO2: 98%   General: Well Developed, well nourished, and in no acute distress.  Neuro/Psych: Alert and oriented x3, extra-ocular muscles intact, able to move all 4 extremities, sensation grossly intact. Skin: Warm and dry, no rashes noted.  Respiratory: Not using accessory muscles, speaking in full sentences, trachea midline.  Cardiovascular: Pulses palpable, no extremity edema. Abdomen: Does not appear distended. MSK: T-spine nontender midline.  Mildly tender palpation  left inferior rhomboid area and left inferior thoracic paraspinal area.  Mild tender palpation left lateral chest wall as well. Normal shoulder motion.  Some pain with resisted scapular retraction.  Lab and Radiology Results X-ray T-spine and ribs visible on chest x-ray obtained April 28, 2020 personally and independently interpreted today. No acute fractures.  No severe thoracic degenerative change.  No rib abnormality.  Impression and Recommendations:    Assessment and Plan: 50 y.o. female with left rhomboid pain and left thoracic chest wall pain.  Ongoing for about a year now.  Pain very likely due to muscle spasm and dysfunction.  Unfortunately she has failed conservative management so far with trials of medication, massage therapy, TENS unit, heating pad, and home massage gun. Plan to treat with physical therapy referral.  If this is not successful after 6 weeks next step should be MRI T-spine to further characterize potential for thoracic radiculopathy.  Discussed treatment plan and options with patient expresses understanding agreement.  She will let me know at 6-week mark if not better and I will order MRI.  PDMP not reviewed this encounter. Orders Placed This Encounter  Procedures   Ambulatory referral to Physical Therapy    Referral Priority:   Routine    Referral Type:   Physical Medicine    Referral Reason:   Specialty Services Required    Requested Specialty:   Physical Therapy    Number of Visits Requested:   1   No orders of the defined types were placed in this encounter.   Discussed warning signs or symptoms. Please see discharge  instructions. Patient expresses understanding.   The above documentation has been reviewed and is accurate and complete Lynne Leader, M.D.

## 2021-01-13 NOTE — Patient Instructions (Signed)
Thank you for coming in today.   I've referred you to Physical Therapy.  Let us know if you don't hear from them in one week.   If not improved in 6 weeks next step should be MRI Tspine. I think we will have enough to get the MRI authorized and I could likely just order it.  Let me know.   Sometimes the insurance wants an xray. I think the CXR should count.

## 2021-01-19 ENCOUNTER — Other Ambulatory Visit: Payer: Self-pay | Admitting: Family Medicine

## 2021-01-20 ENCOUNTER — Other Ambulatory Visit: Payer: Self-pay | Admitting: Family Medicine

## 2021-01-20 ENCOUNTER — Encounter: Payer: Self-pay | Admitting: Physician Assistant

## 2021-01-20 NOTE — Telephone Encounter (Signed)
Patient states [facility] has still not received the RX can we resend and call to verify they received it.

## 2021-01-21 MED ORDER — CLONAZEPAM 0.5 MG PO TABS
ORAL_TABLET | ORAL | 0 refills | Status: DC
Start: 2021-01-21 — End: 2021-02-26

## 2021-01-21 NOTE — Telephone Encounter (Signed)
Alyssa can you please send pt's Rx for Clonazepam to the pharmacy they did not receive it. Thanks

## 2021-02-26 ENCOUNTER — Other Ambulatory Visit: Payer: Self-pay | Admitting: Physician Assistant

## 2021-02-27 ENCOUNTER — Encounter: Payer: Self-pay | Admitting: Physician Assistant

## 2021-02-27 MED ORDER — CLONAZEPAM 0.5 MG PO TABS
ORAL_TABLET | ORAL | 0 refills | Status: DC
Start: 2021-02-27 — End: 2021-04-01

## 2021-03-06 ENCOUNTER — Other Ambulatory Visit: Payer: Self-pay

## 2021-03-06 ENCOUNTER — Ambulatory Visit: Payer: 59 | Admitting: Physical Therapy

## 2021-03-06 DIAGNOSIS — M546 Pain in thoracic spine: Secondary | ICD-10-CM

## 2021-03-17 ENCOUNTER — Encounter: Payer: Self-pay | Admitting: Physical Therapy

## 2021-03-17 NOTE — Therapy (Signed)
[facility] [address] [place], [place], [address] Phone: [phone]   Fax:  [phone]  Physical Therapy Evaluation  Patient Details  Name: [name] [name] MRN: [id number] Date of Birth: Feb 06, 1971 Referring Provider (PT): Lynne Leader   Encounter Date: 03/06/2021   PT End of Session - 03/17/21 1355     Visit Number 1    Number of Visits 12    Date for PT Re-Evaluation 04/17/21    Authorization Type UHC    PT Start Time 9528    PT Stop Time 1400    PT Time Calculation (min) 42 min    Activity Tolerance Patient tolerated treatment well    Behavior During Therapy [facility] for tasks assessed/performed             Past Medical History:  Diagnosis Date   Anxiety    Depression    GERD (gastroesophageal reflux disease)    History of back surgery 12/18/2018   IBS (irritable bowel syndrome)     Past Surgical History:  Procedure Laterality Date   AUGMENTATION MAMMAPLASTY Bilateral 03/2017   BACK SURGERY Bilateral 12/18/2018   BILATERAL SALPINGECTOMY Bilateral 10/31/2012   Procedure: BILATERAL SALPINGECTOMY;  Surgeon: Lyman Speller, MD;  Location: [facility];  Service: Gynecology;  Laterality: Bilateral;   BREAST ENHANCEMENT SURGERY  04/12/2017   CESAREAN SECTION     CYSTO WITH HYDRODISTENSION N/A 10/31/2012   Procedure: CYSTOSCOPY/HYDRODISTENSION;  Surgeon: Reece Packer, MD;  Location: [facility];  Service: Urology;  Laterality: N/A;   DILATION AND CURETTAGE OF UTERUS     DILITATION & CURRETTAGE/HYSTROSCOPY WITH NOVASURE ABLATION N/A 10/31/2012   Procedure: DILATATION & CURETTAGE/HYSTEROSCOPY WITH NOVASURE ABLATION;  Surgeon: Lyman Speller, MD;  Location: [facility];  Service: Gynecology;  Laterality: N/A;  Dr Suzanne Boron Diarmid to start case with a cysto at beginning of case while patient under anesthesia.   ENDOMETRIAL BIOPSY  09/19/12   neg   LAPAROSCOPY Bilateral 10/31/2012   Procedure: LAPAROSCOPY OPERATIVE;  Surgeon: Lyman Speller, MD;  Location: [facility];  Service: Gynecology;  Laterality: Bilateral;    There were no vitals filed for this visit.    Subjective Assessment - 03/17/21 1344     Subjective Pt states L thoracic pain. Did have previous L UT pain that is somewhat improved. She notes pain at night when trying to get comfortable and with sleeping, laying on back for long time. States pain in back, wraps around to front.     She is able to exercise: home gym, t-mill, stretches.    Pertinent History Lumbar discectomy 2020 , remaining leg cramps.    Currently in Pain? Yes    Pain Score 8     Pain Location Thoracic    Pain Orientation Left    Pain Descriptors / Indicators Aching    Pain Type Chronic pain    Pain Radiating Towards around L ribcage    Pain Onset More than a month ago    Pain Frequency Intermittent    Aggravating Factors  laying on L side, work duteis.                [facility] PT Assessment - 03/17/21 0001       Assessment   Medical Diagnosis L thoracic pain    Referring Provider (PT) Lynne Leader    Prior Therapy no      Precautions   Precautions None      Balance Screen   Has the  patient fallen in the past 6 months No      Prior Function   Level of Independence Independent      Cognition   Overall Cognitive Status Within Functional Limits for tasks assessed      AROM   Overall AROM Comments lumbar/thoracic: WNL, shoulder: WNL      Strength   Overall Strength Comments Shoulders: 4/5, scapular: 4-/5      Palpation   Palpation comment tenderness/tightness in L mid trap, thoracic paraspinals, rhomboid,                        Objective measurements completed on examination: See above findings.       [facility] Adult PT Treatment/Exercise - 03/17/21 0001       Manual Therapy   Manual Therapy Joint mobilization;Soft tissue mobilization    Joint Mobilization THoracic PA mobs    Soft tissue mobilization DTm/TPR to L  rhomboid, mid-low trap, thoracic  paraspinals.              Trigger Point Dry Needling - 03/17/21 0001     Consent Given? Yes    Education Handout Provided Yes    Muscles Treated Back/Hip Thoracic multifidi   L   Thoracic multifidi response Palpable increased muscle length   L                  PT Education - 03/17/21 1354     Education Details PT POC, Exam findings, HEP    Person(s) Educated Patient    Methods Explanation;Demonstration;Tactile cues;Verbal cues;Handout    Comprehension Verbalized understanding;Returned demonstration;Verbal cues required;Tactile cues required;Need further instruction              PT Short Term Goals - 03/17/21 1359       PT SHORT TERM GOAL #1   Title Pt to be independent with initial HEP    Time 2    Period Weeks    Status New    Target Date 03/20/21               PT Long Term Goals - 03/17/21 1359       PT LONG TERM GOAL #1   Title Pt to be independent with final HEP    Time 6    Period Weeks    Status New    Target Date 04/17/21      PT LONG TERM GOAL #2   Title Pt to report decreased pain in thoracic spine to 0-1/10 with sleeping, exercise, and IADLS.    Time 6    Period Weeks    Status New    Target Date 04/17/21                    Plan - 03/17/21 1405     Clinical Impression Statement Pt presents with primary complaint of increased pain in thoracic spine. She has pain and tenderness in L sided t-spine, and into surrounding musculature. Manual tissure release and dry needling done today, will asesss effects next visit. She has decreased ability for full functional activities, and sleeping, due to pain. Pt to benefit from skilled PT to improve pain, and to return to PLOF without deficit.    Personal Factors and Comorbidities Time since onset of injury/illness/exacerbation    Examination-Activity Limitations Lift;Sleep;Carry;Locomotion Level    Examination-Participation Restrictions Cleaning;Community Activity;Yard Work     Stability/Clinical Decision Making Stable/Uncomplicated    Designer, jewellery Low  Rehab Potential Good    PT Frequency 2x / week    PT Duration 6 weeks    PT Treatment/Interventions ADLs/Self Care Home Management;Cryotherapy;Electrical Stimulation;Moist Heat;Iontophoresis 14m/ml Dexamethasone;Traction;Ultrasound;DME Instruction;Functional mobility training;Therapeutic activities;Therapeutic exercise;Manual techniques;Patient/family education;Neuromuscular re-education;Passive range of motion;Dry needling;Taping;Spinal Manipulations;Joint Manipulations    Consulted and Agree with Plan of Care Patient             Patient will benefit from skilled therapeutic intervention in order to improve the following deficits and impairments:  Decreased activity tolerance, Pain, Decreased strength, Decreased mobility, Increased muscle spasms  Visit Diagnosis: Pain in thoracic spine     Problem List Patient Active Problem List   Diagnosis Date Noted   GAD (generalized anxiety disorder) 08/01/2018   Seizure ([place] 05/17/2013   Nausea alone 01/03/2013   Slow transit constipation 08/12/2011   Seasonal allergic rhinitis 08/12/2011   Tobacco use disorder 01/10/2011   HEMORRHOIDS 05/07/2010   Depression, recurrent ([place] 04/17/2010   HEMATURIA UNSPECIFIED 04/17/2010   LLyndee Hensen PT, DPT 2:12 PM  03/17/21   [place] [address]Phone: [phone]  Fax:  [phone] Name: RLEXIANA SPINDELMRN: [id number] of Birth: 307-09-1970

## 2021-03-24 ENCOUNTER — Encounter: Payer: Self-pay | Admitting: Physical Therapy

## 2021-03-24 ENCOUNTER — Ambulatory Visit: Payer: 59 | Admitting: Physical Therapy

## 2021-03-24 ENCOUNTER — Other Ambulatory Visit: Payer: Self-pay

## 2021-03-24 DIAGNOSIS — M546 Pain in thoracic spine: Secondary | ICD-10-CM | POA: Diagnosis not present

## 2021-03-24 NOTE — Therapy (Signed)
[facility] [address] [place], [place], [address] Phone: [phone]   Fax:  [phone]  Physical Therapy Treatment  Patient Details  Name: [name] [name] MRN: [id number] Date of Birth: [birth year] Referring Provider (PT): Lynne Leader   Encounter Date: 03/24/2021   PT End of Session - 03/24/21 1202     Visit Number 2    Number of Visits 12    Date for PT Re-Evaluation 04/17/21    Authorization Type UHC    PT Start Time 0233    PT Stop Time 1110    PT Time Calculation (min) 55 min    Activity Tolerance Patient tolerated treatment well    Behavior During Therapy [facility] for tasks assessed/performed             Past Medical History:  Diagnosis Date   Anxiety    Depression    GERD (gastroesophageal reflux disease)    History of back surgery 12/18/2018   IBS (irritable bowel syndrome)     Past Surgical History:  Procedure Laterality Date   AUGMENTATION MAMMAPLASTY Bilateral 03/2017   BACK SURGERY Bilateral 12/18/2018   BILATERAL SALPINGECTOMY Bilateral 10/31/2012   Procedure: BILATERAL SALPINGECTOMY;  Surgeon: Lyman Speller, MD;  Location: [facility];  Service: Gynecology;  Laterality: Bilateral;   BREAST ENHANCEMENT SURGERY  04/12/2017   CESAREAN SECTION     CYSTO WITH HYDRODISTENSION N/A 10/31/2012   Procedure: CYSTOSCOPY/HYDRODISTENSION;  Surgeon: Reece Packer, MD;  Location: [facility];  Service: Urology;  Laterality: N/A;   DILATION AND CURETTAGE OF UTERUS     DILITATION & CURRETTAGE/HYSTROSCOPY WITH NOVASURE ABLATION N/A 10/31/2012   Procedure: DILATATION & CURETTAGE/HYSTEROSCOPY WITH NOVASURE ABLATION;  Surgeon: Lyman Speller, MD;  Location: [facility];  Service: Gynecology;  Laterality: N/A;  Dr Suzanne Boron Diarmid to start case with a cysto at beginning of case while patient under anesthesia.   ENDOMETRIAL BIOPSY  09/19/12   neg   LAPAROSCOPY Bilateral 10/31/2012   Procedure: LAPAROSCOPY OPERATIVE;  Surgeon: Lyman Speller, MD;  Location: [facility];  Service: Gynecology;  Laterality: Bilateral;    There were no vitals filed for this visit.   Subjective Assessment - 03/24/21 1202     Subjective Pt states continued , bothersome pain. She did have mild relief after last session, but pain has returned.    Currently in Pain? Yes    Pain Score 8     Pain Location Thoracic    Pain Orientation Left    Pain Descriptors / Indicators Aching;Tightness    Pain Type Chronic pain    Pain Onset More than a month ago    Pain Frequency Intermittent    Aggravating Factors  work duties, lifting, carrying                               [facility] Adult PT Treatment/Exercise - 03/24/21 0001       Exercises   Exercises Neck      Neck Exercises: Theraband   Rows 20 reps;Green    Shoulder External Rotation 20 reps;Red    Horizontal ABduction 15 reps;Red      Neck Exercises: Stretches   Other Neck Stretches Standing side bending 30 sec x 2 bil;  POsterior shoulder stretch 30 sec x 2 bil;      Modalities   Modalities Moist Heat      Moist Heat Therapy   Number Minutes Moist Heat  10 Minutes    Moist Heat Location Cervical      Manual Therapy   Manual therapy comments skilled palpation and monitoring of soft tissue with dry needling.    Joint Mobilization THoracic PA mobs    Soft tissue mobilization DTm/TPR to L  rhomboid, mid-low trap, thoracic paraspinals.              Trigger Point Dry Needling - 03/24/21 0001     Consent Given? Yes    Education Handout Provided Previously provided    Muscles Treated Head and Neck Upper trapezius    Muscles Treated Back/Hip Thoracic multifidi;Erector spinae    Upper Trapezius Response Twitch reponse elicited;Palpable increased muscle length   L   Erector spinae Response Palpable increased muscle length   Thoracic , L   Thoracic multifidi response Palpable increased muscle length   L                    PT Short Term Goals - 03/17/21  1359       PT SHORT TERM GOAL #1   Title Pt to be independent with initial HEP    Time 2    Period Weeks    Status New    Target Date 03/20/21               PT Long Term Goals - 03/17/21 1359       PT LONG TERM GOAL #1   Title Pt to be independent with final HEP    Time 6    Period Weeks    Status New    Target Date 04/17/21      PT LONG TERM GOAL #2   Title Pt to report decreased pain in thoracic spine to 0-1/10 with sleeping, exercise, and IADLS.    Time 6    Period Weeks    Status New    Target Date 04/17/21                    Patient will benefit from skilled therapeutic intervention in order to improve the following deficits and impairments:     Visit Diagnosis: Pain in thoracic spine     Problem List Patient Active Problem List   Diagnosis Date Noted   GAD (generalized anxiety disorder) 08/01/2018   Seizure ([address]) 05/17/2013   Nausea alone 01/03/2013   Slow transit constipation 08/12/2011   Seasonal allergic rhinitis 08/12/2011   Tobacco use disorder 01/10/2011   HEMORRHOIDS 05/07/2010   Depression, recurrent ([place]) 04/17/2010   HEMATURIA UNSPECIFIED 04/17/2010   [name] [name], PT, DPT 12:04 PM  03/24/21    [facility] [place], [place], [address] Phone: [phone]   Fax:  [phone]  Name: [name] [name] MRN: [id number] Date of Birth: 21-Nov-1970

## 2021-03-31 ENCOUNTER — Ambulatory Visit: Payer: 59 | Admitting: Physical Therapy

## 2021-03-31 ENCOUNTER — Other Ambulatory Visit: Payer: Self-pay

## 2021-03-31 DIAGNOSIS — M546 Pain in thoracic spine: Secondary | ICD-10-CM | POA: Diagnosis not present

## 2021-04-01 ENCOUNTER — Other Ambulatory Visit: Payer: Self-pay | Admitting: Physician Assistant

## 2021-04-01 ENCOUNTER — Encounter: Payer: Self-pay | Admitting: Physical Therapy

## 2021-04-01 MED ORDER — CLONAZEPAM 0.5 MG PO TABS
ORAL_TABLET | ORAL | 0 refills | Status: DC
Start: 2021-04-01 — End: 2021-05-05

## 2021-04-01 NOTE — Therapy (Addendum)
[facility] [address] [place], [place], [address] Phone: [phone]   Fax:  [phone]  Physical Therapy Treatment  Patient Details  Name: [name] [name] MRN: [id number] Date of Birth: July 03, 1970 Referring Provider (PT): Lynne Leader   Encounter Date: 03/31/2021   PT End of Session - 04/01/21 1411     Visit Number 3    Number of Visits 12    Date for PT Re-Evaluation 04/17/21    Authorization Type UHC    PT Start Time 1540    PT Stop Time 0867    PT Time Calculation (min) 38 min    Activity Tolerance Patient tolerated treatment well    Behavior During Therapy [facility] for tasks assessed/performed             Past Medical History:  Diagnosis Date   Anxiety    Depression    GERD (gastroesophageal reflux disease)    History of back surgery 12/18/2018   IBS (irritable bowel syndrome)     Past Surgical History:  Procedure Laterality Date   AUGMENTATION MAMMAPLASTY Bilateral 03/2017   BACK SURGERY Bilateral 12/18/2018   BILATERAL SALPINGECTOMY Bilateral 10/31/2012   Procedure: BILATERAL SALPINGECTOMY;  Surgeon: Lyman Speller, MD;  Location: [facility];  Service: Gynecology;  Laterality: Bilateral;   BREAST ENHANCEMENT SURGERY  04/12/2017   CESAREAN SECTION     CYSTO WITH HYDRODISTENSION N/A 10/31/2012   Procedure: CYSTOSCOPY/HYDRODISTENSION;  Surgeon: Reece Packer, MD;  Location: [facility];  Service: Urology;  Laterality: N/A;   DILATION AND CURETTAGE OF UTERUS     DILITATION & CURRETTAGE/HYSTROSCOPY WITH NOVASURE ABLATION N/A 10/31/2012   Procedure: DILATATION & CURETTAGE/HYSTEROSCOPY WITH NOVASURE ABLATION;  Surgeon: Lyman Speller, MD;  Location: [facility];  Service: Gynecology;  Laterality: N/A;  Dr Suzanne Boron Diarmid to start case with a cysto at beginning of case while patient under anesthesia.   ENDOMETRIAL BIOPSY  09/19/12   neg   LAPAROSCOPY Bilateral 10/31/2012   Procedure: LAPAROSCOPY OPERATIVE;  Surgeon: Lyman Speller, MD;  Location: [facility];  Service: Gynecology;  Laterality: Bilateral;    There were no vitals filed for this visit.   Subjective Assessment - 04/01/21 1410     Subjective Pt stats minimal change in pain. She states mostly L thoracic pain that radiates around abdomen to under her L breast.    Currently in Pain? Yes    Pain Score 8     Pain Location Thoracic    Pain Orientation Left    Pain Descriptors / Indicators Aching;Tightness    Pain Radiating Towards around L ribcage    Pain Onset More than a month ago    Pain Frequency Intermittent    Aggravating Factors  sleeping on that side, sitting at computer    Pain Relieving Factors stretching                               [facility] Adult PT Treatment/Exercise - 04/01/21 0001       Exercises   Exercises Neck      Neck Exercises: Theraband   Rows 20 reps;Green    Rows Limitations slilght soreness    Shoulder External Rotation 20 reps;Green    Horizontal ABduction 15 reps;Red    Horizontal ABduction Limitations pain      Neck Exercises: Stretches   Other Neck Stretches Standing side bending 30 sec x 2 bil;  POsterior shoulder stretch 30  sec x 2 bil;    Other Neck Stretches childs post 30 sec x 3 bil;  Sidelying thoracic stretch 30 sec x 3;      Modalities   Modalities Moist Heat      Manual Therapy   Joint Mobilization THoracic PA mobs    Soft tissue mobilization DTm/TPR to L  rhomboid, mid-low trap, thoracic paraspinals.                       PT Short Term Goals - 03/17/21 1359       PT SHORT TERM GOAL #1   Title Pt to be independent with initial HEP    Time 2    Period Weeks    Status New    Target Date 03/20/21               PT Long Term Goals - 03/17/21 1359       PT LONG TERM GOAL #1   Title Pt to be independent with final HEP    Time 6    Period Weeks    Status New    Target Date 04/17/21      PT LONG TERM GOAL #2   Title Pt to report decreased pain in  thoracic spine to 0-1/10 with sleeping, exercise, and IADLS.    Time 6    Period Weeks    Status New    Target Date 04/17/21                   Plan - 04/01/21 1417     Clinical Impression Statement Pt with soreness with attempts for scapular strengthening today. She has decreased soreness with stretching, elongation and decompression of thoracic region. She has continued tenderness and triggerpoints in L mid thoracic paraspinals, as well as much tenderness with palpation of L mid thoracic ribs, laterally. Pt declines dry needling today due to work duties following visit, but would like to resume next visit. Discussed that pt may also benefit from thoracic manipulation for possible rib dysfunction.    Personal Factors and Comorbidities Time since onset of injury/illness/exacerbation    Examination-Activity Limitations Lift;Sleep;Carry;Locomotion Level    Examination-Participation Restrictions Cleaning;Community Activity;Yard Work    Stability/Clinical Decision Making Stable/Uncomplicated    Rehab Potential Good    PT Frequency 2x / week    PT Duration 6 weeks    PT Treatment/Interventions ADLs/Self Care Home Management;Cryotherapy;Electrical Stimulation;Moist Heat;Iontophoresis 73m/ml Dexamethasone;Traction;Ultrasound;DME Instruction;Functional mobility training;Therapeutic activities;Therapeutic exercise;Manual techniques;Patient/family education;Neuromuscular re-education;Passive range of motion;Dry needling;Taping;Spinal Manipulations;Joint Manipulations    Consulted and Agree with Plan of Care Patient             Patient will benefit from skilled therapeutic intervention in order to improve the following deficits and impairments:  Decreased activity tolerance, Pain, Decreased strength, Decreased mobility, Increased muscle spasms  Visit Diagnosis: Pain in thoracic spine     Problem List Patient Active Problem List   Diagnosis Date Noted   GAD (generalized anxiety  disorder) 08/01/2018   [place]   Nausea alone 01/03/2013   Slow transit constipation 08/12/2011   Seasonal allergic rhinitis 08/12/2011   Tobacco use disorder 01/10/2011   HEMORRHOIDS 05/07/2010   Depression, recurrent ([place] 04/17/2010   HEMATURIA UNSPECIFIED 04/17/2010    LLyndee Hensen PT, DPT 2:21 PM  04/01/21    [place] [address]Phone: [phone]  Fax:  [phone] Name: RCorrinna KarapetyanCruthis MRN:  [id number] Date of Birth: 04-04-1971  PHYSICAL THERAPY DISCHARGE SUMMARY  Visits from Start of Care:3 Plan: Patient agrees to discharge.  Patient goals were not met. Patient is being discharged due to - not returning since last visit.    Lyndee Hensen, PT, DPT 12:19 PM  07/08/21

## 2021-04-06 ENCOUNTER — Encounter: Payer: Self-pay | Admitting: Family Medicine

## 2021-04-06 DIAGNOSIS — G959 Disease of spinal cord, unspecified: Secondary | ICD-10-CM

## 2021-04-09 ENCOUNTER — Encounter: Payer: 59 | Admitting: Physical Therapy

## 2021-04-11 ENCOUNTER — Other Ambulatory Visit: Payer: 59

## 2021-04-12 ENCOUNTER — Other Ambulatory Visit: Payer: Self-pay

## 2021-04-12 ENCOUNTER — Ambulatory Visit (INDEPENDENT_AMBULATORY_CARE_PROVIDER_SITE_OTHER): Payer: 59

## 2021-04-12 DIAGNOSIS — G959 Disease of spinal cord, unspecified: Secondary | ICD-10-CM

## 2021-04-12 DIAGNOSIS — R0781 Pleurodynia: Secondary | ICD-10-CM

## 2021-04-12 DIAGNOSIS — M549 Dorsalgia, unspecified: Secondary | ICD-10-CM

## 2021-04-12 IMAGING — MR MR THORACIC SPINE W/O CM
5 series · 42 of 48 positions shown · non-contrast
Comparison: Chest radiographs [DATE] and earlier. Lumbar MRI
[DATE].

CLINICAL DATA: 50-year-old female with left side mid back pain,
persistent pain under the left rib cage radiating to the chest and
armpit.

EXAM:
MRI THORACIC SPINE WITHOUT CONTRAST
TECHNIQUE: Multiplanar, multisequence MR imaging of the thoracic spine was
performed. No intravenous contrast was administered.

[Series 4: T2 · sagittal · 3.0mm · 1.00mm/px · 6 of 15 slices shown (1 of 2)]
[im 1/15]
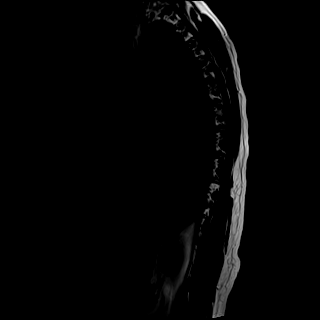
[im 3/15]
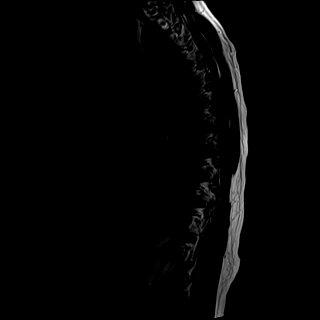
[im 6/15]
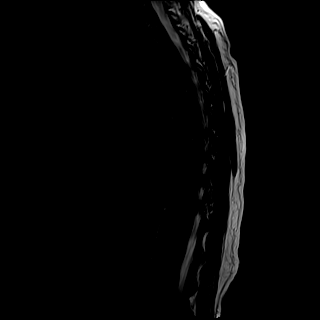
[im 9/15]
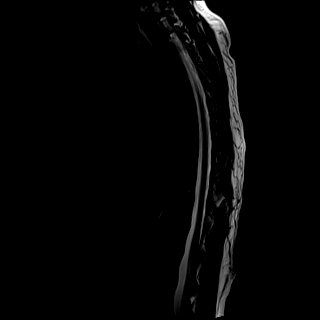
[im 12/15]
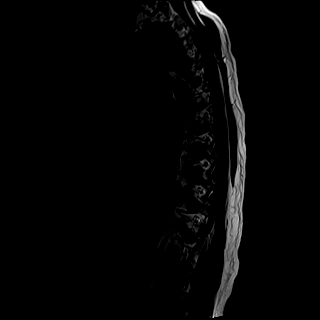
[im 15/15]
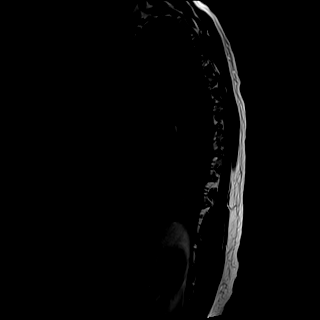

[Series 5: T1 · sagittal · 3.0mm · 1.00mm/px · 6 of 15 slices shown]
[im 1/15]
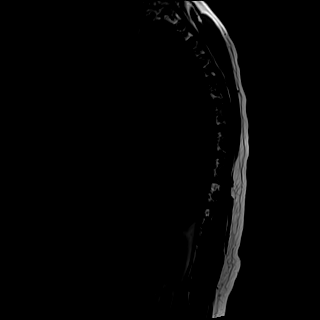
[im 3/15]
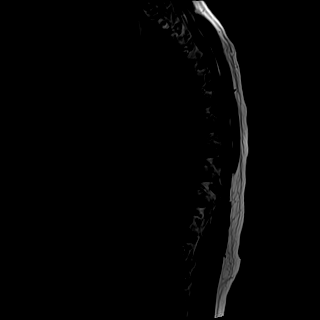
[im 6/15]
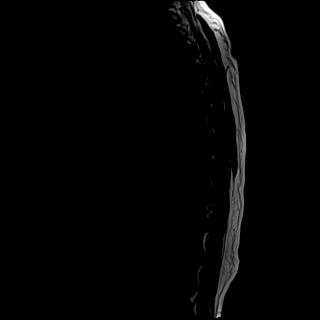
[im 9/15]
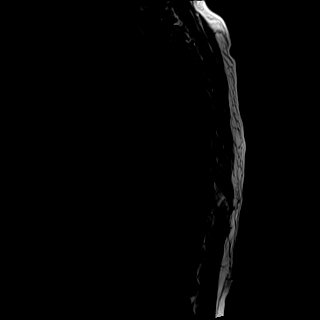
[im 12/15]
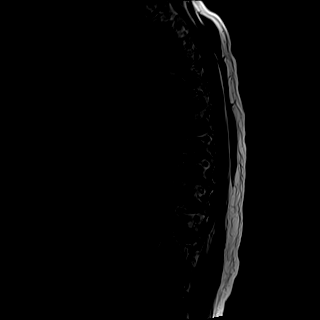
[im 15/15]
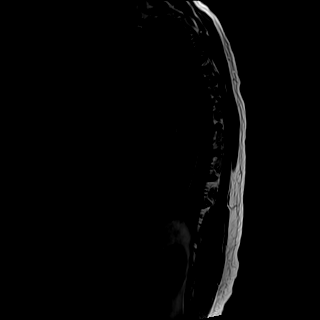

[Series 6: STIR · sagittal · 3.0mm · 1.00mm/px · 6 of 15 slices shown]
[im 1/15]
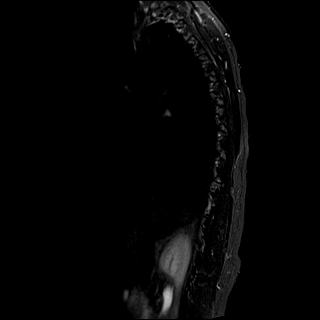
[im 3/15]
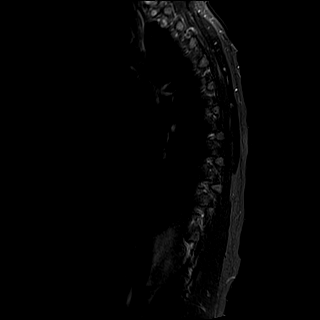
[im 6/15]
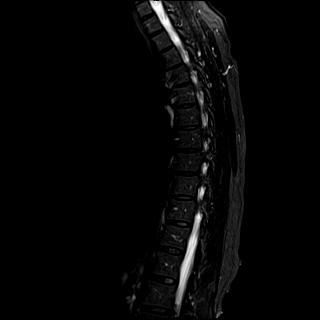
[im 9/15]
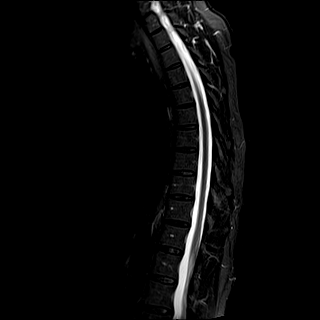
[im 12/15]
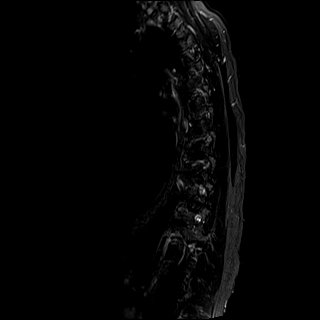
[im 15/15]
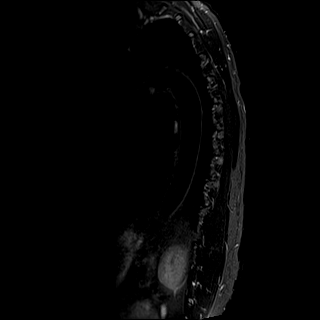

[Series 7: T2 · axial · 5.0mm · 0.86mm/px · z∈[-199,+6]mm · 15 of 39 slices shown (2 of 2)]
[im 1/39]
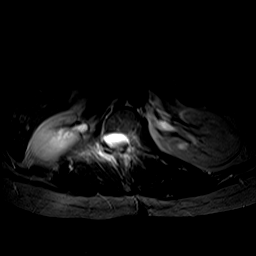
[im 3/39]
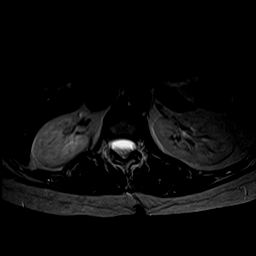
[im 6/39]
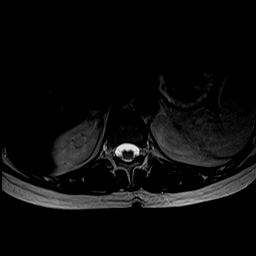
[im 9/39]
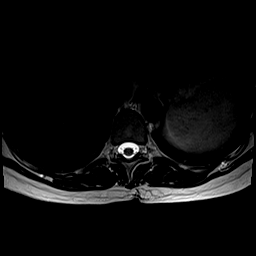
[im 11/39]
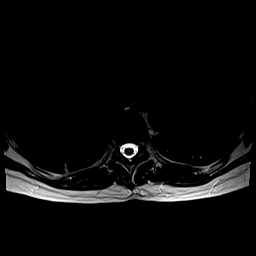
[im 14/39]
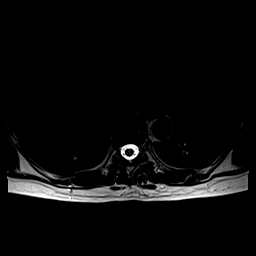
[im 17/39]
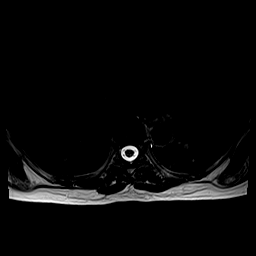
[im 20/39]
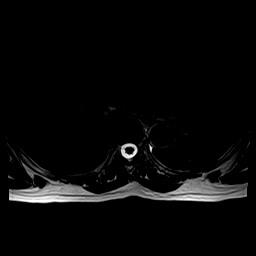
[im 22/39]
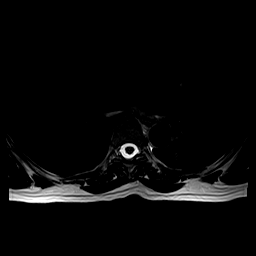
[im 25/39]
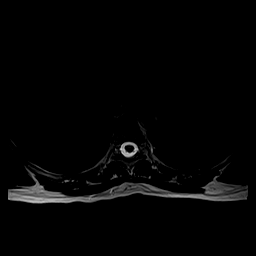
[im 28/39]
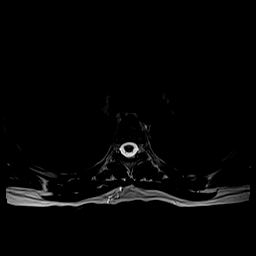
[im 30/39]
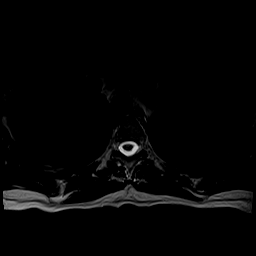
[im 33/39]
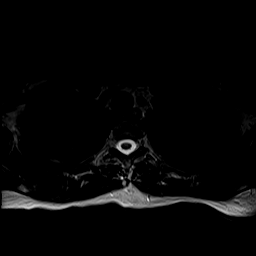
[im 36/39]
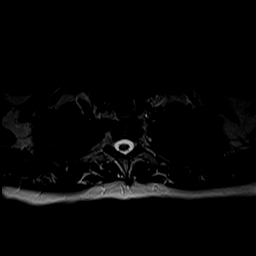
[im 39/39]
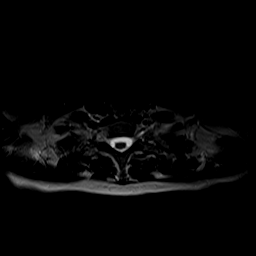

[Series 8: mpgr ax · axial · 5.0mm · 0.35mm/px · z∈[-209,+24]mm · 9 of 39 slices shown]
[im 1/39]
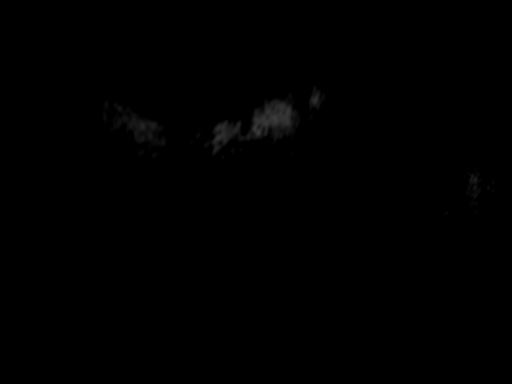
[im 3/39]
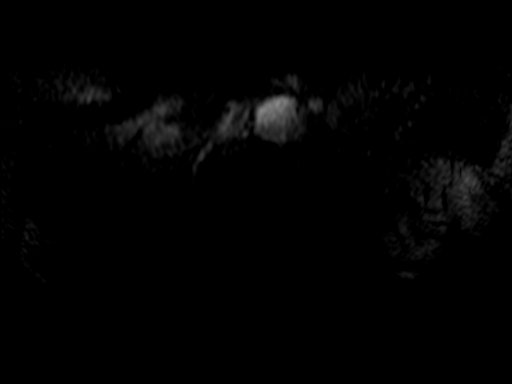
[im 6/39]
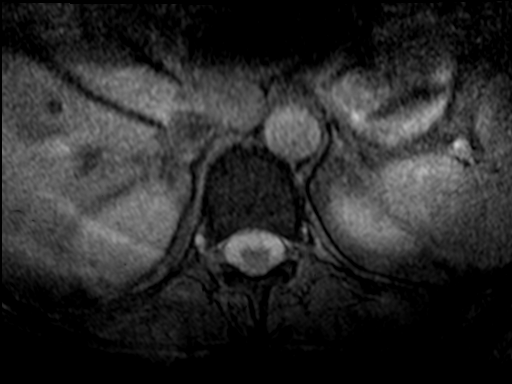
[im 11/39]
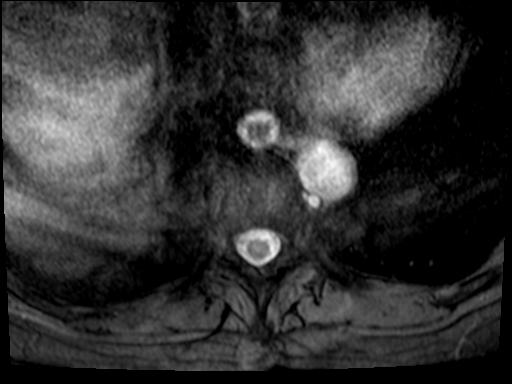
[im 17/39]
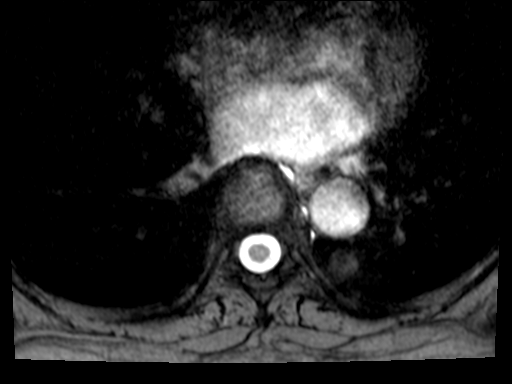
[im 22/39]
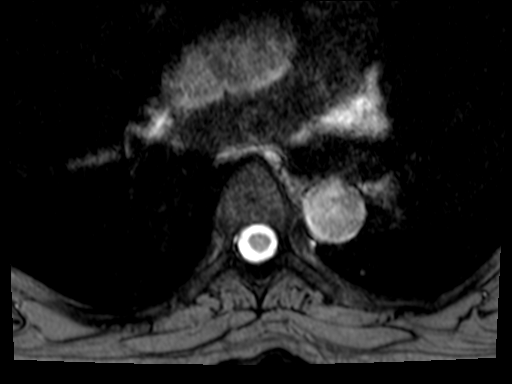
[im 28/39]
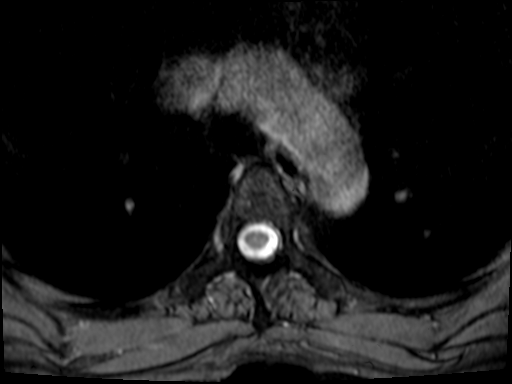
[im 33/39]
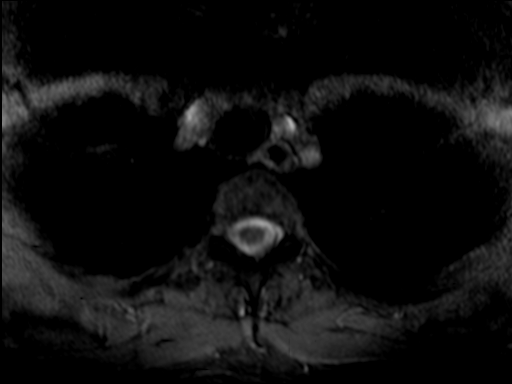
[im 39/39]
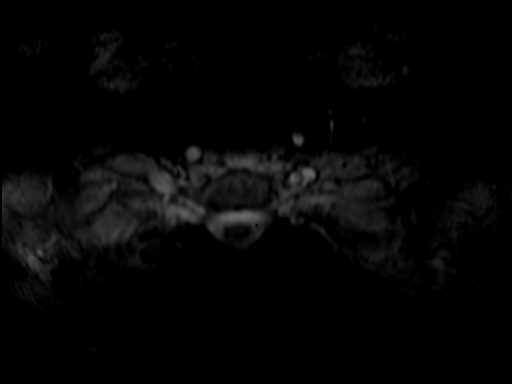

[42 of 48 positions shown; findings below may reference images not displayed]

FINDINGS: Limited cervical spine imaging:  Unremarkable.

Thoracic spine segmentation: Normal on the comparison radiographs,
although hypoplastic ribs at T12.

Alignment:  Normal thoracic kyphosis. No spondylolisthesis.

Vertebrae: No marrow edema or evidence of acute osseous abnormality.
Visualized bone marrow signal is within normal limits. The visible
posterior ribs appear symmetric and normal.

Cord: Normal. Capacious thoracic spinal canal. Normal conus
medullaris at T12.

Paraspinal and other soft tissues: Negative. Paraspinal soft tissues
appears symmetric and normal.

Disc levels:

C7-T1: Negative.

T1-T2: Negative.

T2-T3: Negative.

T3-T4: Negative.

T4-T5: Negative.

T5-T6: Negative.

T6-T7: Negative.

T7-T8: Negative.

T8-T9: Negative.

T9-T10: Negative.

T10-T11: Negative.

T11-T12: Negative.

T12-L1: Negative.
IMPRESSION: Normal MRI appearance of the thoracic spine. No explanation for left
side pain.

## 2021-04-13 NOTE — Progress Notes (Signed)
MRI thoracic spine is normal.  Recommend return to clinic to go the results and discuss potential next steps including other imaging tests if needed as well as future treatment plan and options.  Additionally may want to review or repeat physical exam

## 2021-04-16 ENCOUNTER — Encounter: Payer: 59 | Admitting: Physical Therapy

## 2021-04-20 NOTE — Progress Notes (Deleted)
   I, [name] [name], LAT, ATC, am serving as scribe for Dr. Lynne Leader.  [name] [name] is a 50 y.o. female who presents to [facility] today for f/u of L scapular / rhomoboid pain and to review her T-spine MRI.  She was last seen by Dr. Georgina Snell on 01/13/21 and was referred to PT of which she completed 3 sessions.  She was then referred for a T-spine MRI that she had on 04/12/21.  Today, pt reports   Diagnostic testing: T-spine MRI- 04/12/21; chest XR- 04/28/20  Pertinent review of systems: ***  Relevant historical information: ***   Exam:  There were no vitals taken for this visit. General: Well Developed, well nourished, and in no acute distress.   MSK: ***    Lab and Radiology Results No results found for this or any previous visit (from the past 72 hour(s)). No results found.     Assessment and Plan: 50 y.o. female with ***   PDMP not reviewed this encounter. No orders of the defined types were placed in this encounter.  No orders of the defined types were placed in this encounter.    Discussed warning signs or symptoms. Please see discharge instructions. Patient expresses understanding.   ***

## 2021-04-21 ENCOUNTER — Observation Stay (HOSPITAL_BASED_OUTPATIENT_CLINIC_OR_DEPARTMENT_OTHER)
Admission: EM | Admit: 2021-04-21 | Discharge: 2021-04-22 | Disposition: A | Discharge status: Home / Self Care | Payer: 59 | Attending: Internal Medicine | Admitting: Internal Medicine

## 2021-04-21 ENCOUNTER — Emergency Department (HOSPITAL_BASED_OUTPATIENT_CLINIC_OR_DEPARTMENT_OTHER): Payer: 59

## 2021-04-21 ENCOUNTER — Other Ambulatory Visit: Payer: Self-pay

## 2021-04-21 ENCOUNTER — Encounter (HOSPITAL_BASED_OUTPATIENT_CLINIC_OR_DEPARTMENT_OTHER): Payer: Self-pay

## 2021-04-21 DIAGNOSIS — Z20822 Contact with and (suspected) exposure to covid-19: Secondary | ICD-10-CM | POA: Insufficient documentation

## 2021-04-21 DIAGNOSIS — Z79899 Other long term (current) drug therapy: Secondary | ICD-10-CM | POA: Diagnosis not present

## 2021-04-21 DIAGNOSIS — F411 Generalized anxiety disorder: Secondary | ICD-10-CM | POA: Diagnosis present

## 2021-04-21 DIAGNOSIS — Z87891 Personal history of nicotine dependence: Secondary | ICD-10-CM | POA: Diagnosis not present

## 2021-04-21 DIAGNOSIS — K219 Gastro-esophageal reflux disease without esophagitis: Secondary | ICD-10-CM

## 2021-04-21 DIAGNOSIS — R079 Chest pain, unspecified: Secondary | ICD-10-CM | POA: Diagnosis not present

## 2021-04-21 DIAGNOSIS — F419 Anxiety disorder, unspecified: Secondary | ICD-10-CM | POA: Diagnosis present

## 2021-04-21 DIAGNOSIS — R0789 Other chest pain: Secondary | ICD-10-CM | POA: Diagnosis present

## 2021-04-21 DIAGNOSIS — M549 Dorsalgia, unspecified: Secondary | ICD-10-CM

## 2021-04-21 LAB — TROPONIN I (HIGH SENSITIVITY)
Troponin I (High Sensitivity): <2 ng/L (ref <18)
Troponin I (High Sensitivity): <2 ng/L (ref <18)
Troponin I (High Sensitivity): <2 ng/L (ref <18)

## 2021-04-21 LAB — RAPID URINE DRUG SCREEN, HOSP PERFORMED
Amphetamines: NOT DETECTED
Barbiturates: NOT DETECTED
Benzodiazepines: POSITIVE — AB
Cocaine: NOT DETECTED
Opiates: NOT DETECTED
Tetrahydrocannabinol: NOT DETECTED

## 2021-04-21 LAB — CBC WITH DIFFERENTIAL/PLATELET
Abs Immature Granulocytes: 0.01 10*3/uL (ref 0.00–0.07)
Basophils Absolute: 0 10*3/uL (ref 0.0–0.1)
Basophils Relative: 1 %
Eosinophils Absolute: 0.1 10*3/uL (ref 0.0–0.5)
Eosinophils Relative: 2 %
HCT: 38.2 % (ref 36.0–46.0)
Hemoglobin: 12.8 g/dL (ref 12.0–15.0)
Immature Granulocytes: 0 %
Lymphocytes Relative: 33 %
Lymphs Abs: 1.9 10*3/uL (ref 0.7–4.0)
MCH: 31.7 pg (ref 26.0–34.0)
MCHC: 33.5 g/dL (ref 30.0–36.0)
MCV: 94.6 fL (ref 80.0–100.0)
Monocytes Absolute: 0.4 10*3/uL (ref 0.1–1.0)
Monocytes Relative: 6 %
Neutro Abs: 3.4 10*3/uL (ref 1.7–7.7)
Neutrophils Relative %: 58 %
Platelets: 267 10*3/uL (ref 150–400)
RBC: 4.04 MIL/uL (ref 3.87–5.11)
RDW: 12 % (ref 11.5–15.5)
WBC: 5.8 10*3/uL (ref 4.0–10.5)
nRBC: 0 % (ref 0.0–0.2)

## 2021-04-21 LAB — COMPREHENSIVE METABOLIC PANEL
ALT: 20 U/L (ref 0–44)
AST: 21 U/L (ref 15–41)
Albumin: 4.9 g/dL (ref 3.5–5.0)
Alkaline Phosphatase: 72 U/L (ref 38–126)
Anion gap: 10 (ref 5–15)
BUN: 15 mg/dL (ref 6–20)
CO2: 26 mmol/L (ref 22–32)
Calcium: 10.2 mg/dL (ref 8.9–10.3)
Chloride: 105 mmol/L (ref 98–111)
Creatinine, Ser: 0.65 mg/dL (ref 0.44–1.00)
GFR, Estimated: >60 mL/min (ref >60)
Glucose, Bld: 107 mg/dL — ABNORMAL HIGH (ref 70–99)
Potassium: 3.9 mmol/L (ref 3.5–5.1)
Sodium: 141 mmol/L (ref 135–145)
Total Bilirubin: 0.8 mg/dL (ref 0.3–1.2)
Total Protein: 7.9 g/dL (ref 6.5–8.1)

## 2021-04-21 LAB — RESP PANEL BY RT-PCR (FLU A&B, COVID) ARPGX2
Influenza A by PCR: NEGATIVE
Influenza B by PCR: NEGATIVE
SARS Coronavirus 2 by RT PCR: NEGATIVE

## 2021-04-21 LAB — LIPASE, BLOOD: Lipase: 18 U/L (ref 11–51)

## 2021-04-21 IMAGING — CT CT ANGIO CHEST
2 of 7 series · 13 of 36 positions shown · IV contrast (omnipaque)
Comparison: None.

CLINICAL DATA: Chest pain, back pain and diaphoresis.

EXAM:
CT ANGIOGRAPHY CHEST WITH CONTRAST
TECHNIQUE: Multidetector CT imaging of the chest was performed using the
standard protocol during bolus administration of intravenous
contrast. Multiplanar CT image reconstructions and MIPs were
obtained to evaluate the vascular anatomy.
CONTRAST:  75mL OMNIPAQUE IOHEXOL 350 MG/ML SOLN

[Series 5: pe axial thins · axial · 0.63mm/px · z∈[+176,+399]mm · 12 of 265 slices shown]
[im 21/265  lung]
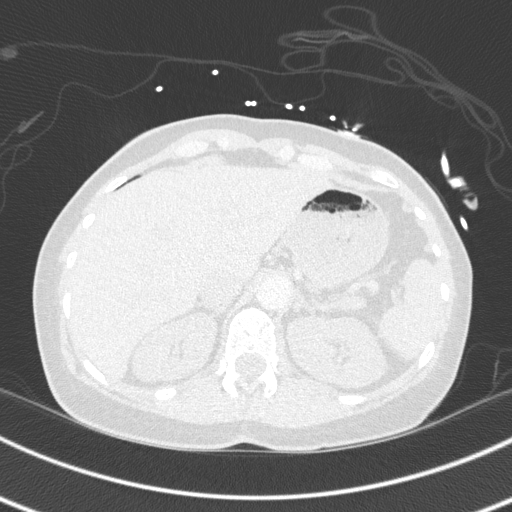
[im 41/265  mediastinal]
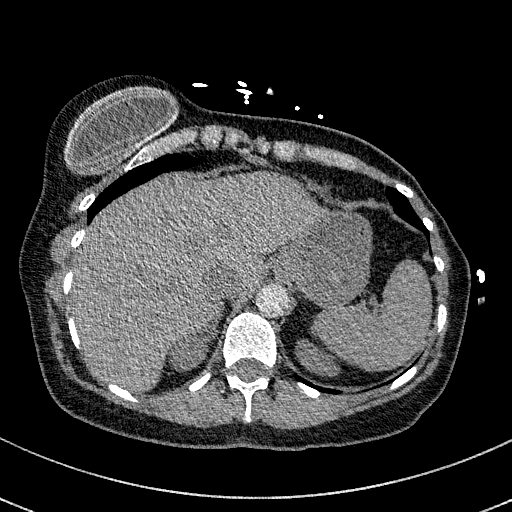
[im 61/265  lung]
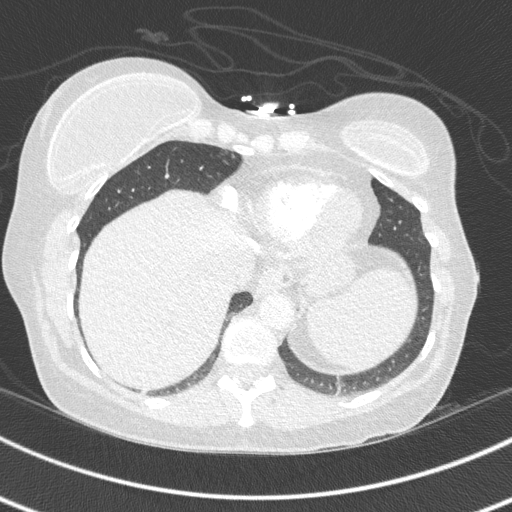
[im 82/265  mediastinal]
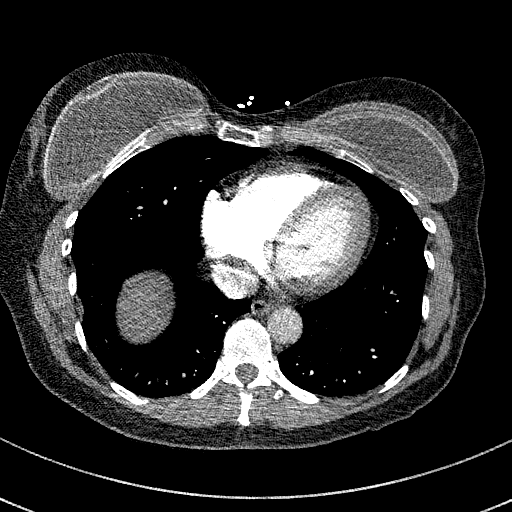
[im 102/265  lung]
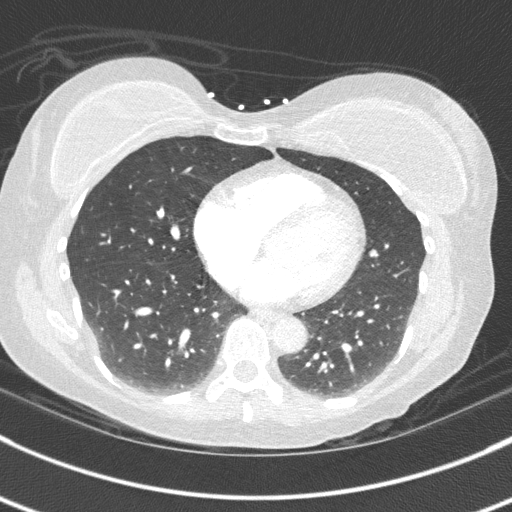
[im 122/265  mediastinal]
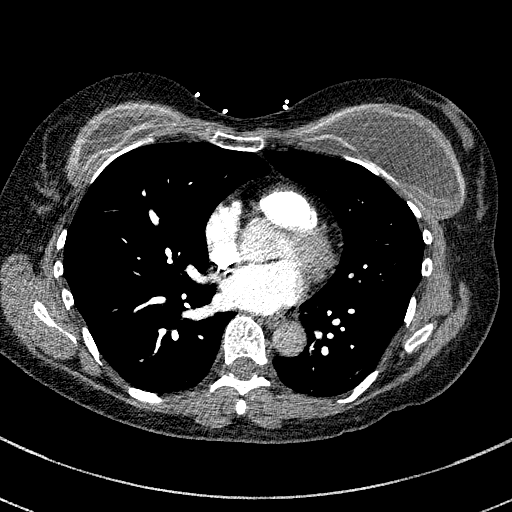
[im 143/265  lung]
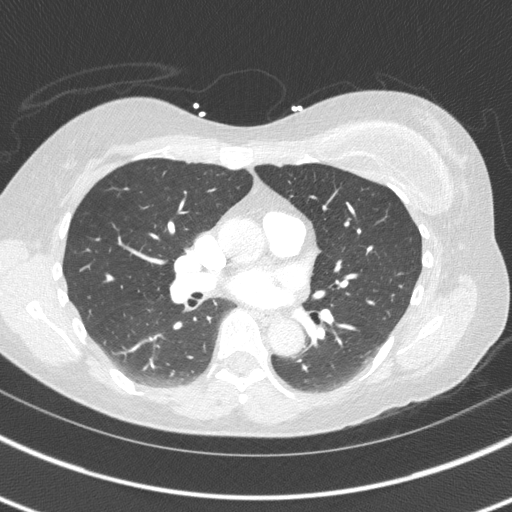
[im 163/265  mediastinal]
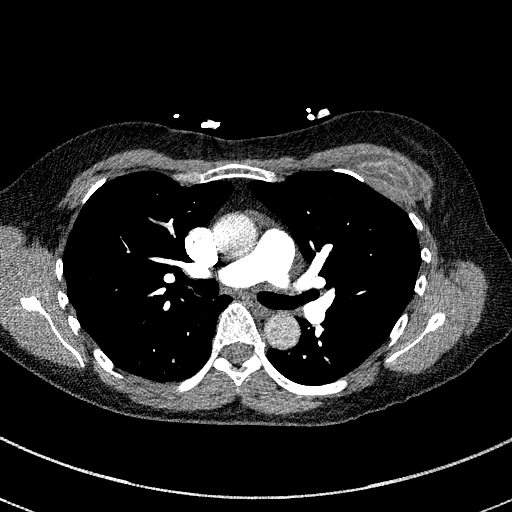
[im 183/265  lung]
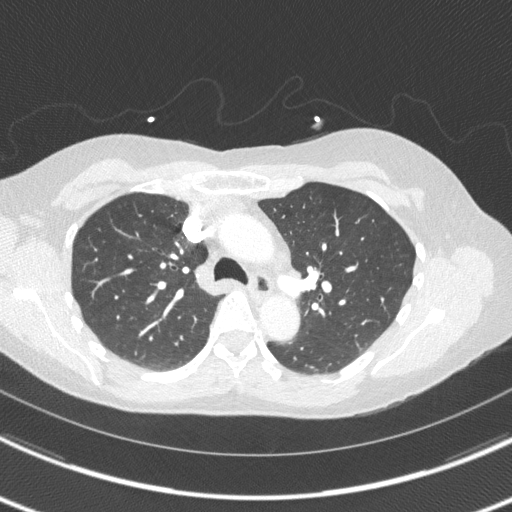
[im 204/265  mediastinal]
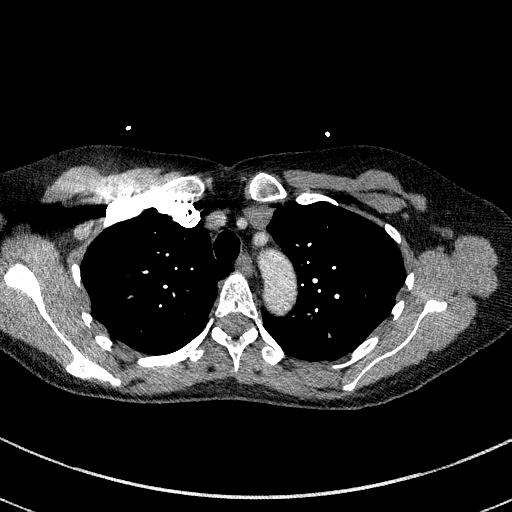
[im 224/265  lung]
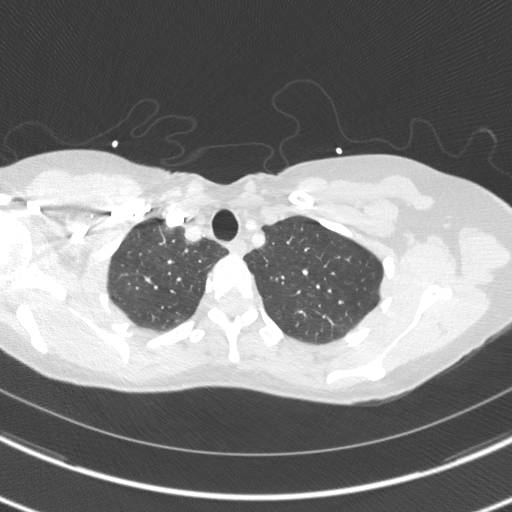
[im 244/265  mediastinal]
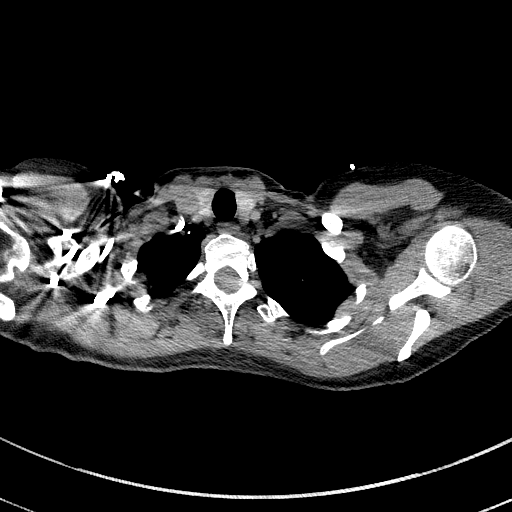

[Series 7: cor soft · coronal · 0.54mm/px · 1 of 121 slices shown]
[im 61/121  mediastinal]
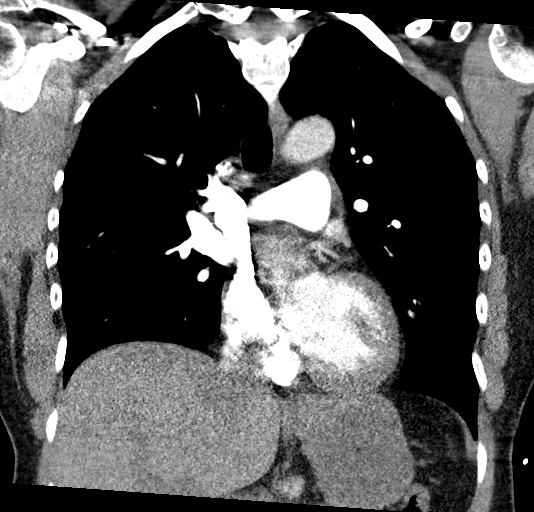

[13 of 36 positions shown; findings below may reference images not displayed]

FINDINGS: Cardiovascular: The heart is normal in size. No pericardial
effusion. The aorta is normal in caliber. Mild tortuosity. No
dissection. The branch vessels are patent. Minimal scattered
atherosclerotic calcifications.

The pulmonary arterial tree is well opacified. No filling defects to
suggest pulmonary embolism.

Mediastinum/Nodes: No mediastinal or hilar mass or lymphadenopathy.
The esophagus is unremarkable.

Lungs/Pleura: The lungs are clear of an acute process. No worrisome
pulmonary lesions or pulmonary nodules. No pleural effusions or
pleural nodules. The central tracheobronchial tree is unremarkable.

Upper Abdomen: No significant upper abdominal findings.

Musculoskeletal: Bilateral breast prostheses are noted. No breast
masses, supraclavicular or axillary adenopathy. The thyroid gland is
unremarkable. The bony thorax is intact.

Review of the MIP images confirms the above findings.
IMPRESSION: 1. No CT findings for pulmonary embolism.
2. Normal thoracic aorta.
3. No acute pulmonary findings.

Aortic Atherosclerosis ([O5]-[O5]).

## 2021-04-21 MED ORDER — ACETAMINOPHEN 650 MG RE SUPP: 650.0000 mg | Freq: Four times a day (QID) | RECTAL | Status: DC | PRN

## 2021-04-21 MED ORDER — ENOXAPARIN SODIUM 40 MG/0.4ML IJ SOSY
40.0000 mg | PREFILLED_SYRINGE | Freq: Every day | INTRAMUSCULAR | Status: DC
  Administered 2021-04-21: 40 mg via SUBCUTANEOUS
  Filled 2021-04-21: qty 0.4

## 2021-04-21 MED ORDER — CLONAZEPAM 0.5 MG PO TABS
0.5000 mg | ORAL_TABLET | Freq: Two times a day (BID) | ORAL | Status: DC | PRN
  Administered 2021-04-21: 0.5 mg via ORAL
  Filled 2021-04-21: qty 1

## 2021-04-21 MED ORDER — LIDOCAINE VISCOUS HCL 2 % MT SOLN
15.0000 mL | Freq: Once | OROMUCOSAL | Status: AC
  Administered 2021-04-21: 15 mL via ORAL
  Filled 2021-04-21: qty 15

## 2021-04-21 MED ORDER — NITROGLYCERIN 0.4 MG SL SUBL
0.4000 mg | SUBLINGUAL_TABLET | Freq: Once | SUBLINGUAL | Status: AC
  Administered 2021-04-21: 0.4 mg via SUBLINGUAL
  Filled 2021-04-21: qty 1

## 2021-04-21 MED ORDER — ALUM & MAG HYDROXIDE-SIMETH 200-200-20 MG/5ML PO SUSP
30.0000 mL | Freq: Once | ORAL | Status: AC
  Administered 2021-04-21: 30 mL via ORAL
  Filled 2021-04-21: qty 30

## 2021-04-21 MED ORDER — IOHEXOL 350 MG/ML SOLN
75.0000 mL | Freq: Once | INTRAVENOUS | Status: AC | PRN
  Administered 2021-04-21: 75 mL via INTRAVENOUS

## 2021-04-21 MED ORDER — ACETAMINOPHEN 325 MG PO TABS
650.0000 mg | ORAL_TABLET | Freq: Four times a day (QID) | ORAL | Status: DC | PRN
  Administered 2021-04-21 – 2021-04-22 (×2): 650 mg via ORAL
  Filled 2021-04-21 (×2): qty 2

## 2021-04-21 NOTE — ED Notes (Signed)
[facility] called for report, pt up to [facility].  Pt stable for transfer ETA 20 min.

## 2021-04-21 NOTE — ED Notes (Signed)
Attempt to call RN for report, states will call back in 5-10.  RN in pt room.

## 2021-04-21 NOTE — ED Notes (Signed)
Report called to [place] on [address] at bedside to transport pt to [facility].

## 2021-04-21 NOTE — H&P (Signed)
History and Physical    [name] [name] GDJ:[id number] DOB: 1970-09-19 DOA: 04/21/2021  PCP: Fredirick Lathe, PA-C Patient coming from: [facility]  Chief Complaint: Chest pain  HPI: [name] [name] is a 50 y.o. female with medical history significant of anxiety, depression, GERD, IBS, chronic back pain presented to the ED complaining of chest pain.  Vital signs stable.  Initial EKG revealed new ST depressions in inferior and lateral leads.  Patient was given nitroglycerin with improvement of her pain.  Repeat EKG showed resolution of ST depressions.  High-sensitivity troponin x2 negative.  CT angiogram negative for PE.  ED physician discussed the case with cardiology (Dr. Oval Linsey) who recommended admission for ACS rule out, suspected this could be coronary vasospasm.  Patient states she is having ongoing pain in the middle of her back for the past 2 years.  The pain is worse when she lays down flat at night and radiates to her chest and left side.  States she is seen by sports medicine and had an MRI of her thoracic spine done a week ago which was normal.  The pain has gotten progressively worse over time.  It is not necessarily exertional as she is otherwise physically active and walks on her treadmill every day without experiencing any symptoms.  States yesterday she went to a client's house and was not feeling well.  She felt lightheaded and felt like she was going to pass out.  She experienced pain on the left side of her chest which radiated to her jaw and left arm.  She was clammy and felt the urge to have a bowel movement.  Symptoms lasted about 1.5 hours.  States she felt better after she received nitroglycerin in the emergency room.  She is also having a lot of acid reflux and requesting a medication.  She continues to have some mild left-sided chest discomfort at this time.  States her father had a heart attack later on in life and had to undergo open heart surgery.  She takes a medication for  anxiety but denies any other drug use.  Review of Systems:  All systems reviewed and apart from history of presenting illness, are negative.  Past Medical History:  Diagnosis Date   Anxiety    Depression    GERD (gastroesophageal reflux disease)    History of back surgery 12/18/2018   IBS (irritable bowel syndrome)     Past Surgical History:  Procedure Laterality Date   AUGMENTATION MAMMAPLASTY Bilateral 03/2017   BACK SURGERY Bilateral 12/18/2018   BILATERAL SALPINGECTOMY Bilateral 10/31/2012   Procedure: BILATERAL SALPINGECTOMY;  Surgeon: Lyman Speller, MD;  Location: [facility];  Service: Gynecology;  Laterality: Bilateral;   BREAST ENHANCEMENT SURGERY  04/12/2017   CESAREAN SECTION     CYSTO WITH HYDRODISTENSION N/A 10/31/2012   Procedure: CYSTOSCOPY/HYDRODISTENSION;  Surgeon: Reece Packer, MD;  Location: [facility];  Service: Urology;  Laterality: N/A;   DILATION AND CURETTAGE OF UTERUS     DILITATION & CURRETTAGE/HYSTROSCOPY WITH NOVASURE ABLATION N/A 10/31/2012   Procedure: DILATATION & CURETTAGE/HYSTEROSCOPY WITH NOVASURE ABLATION;  Surgeon: Lyman Speller, MD;  Location: [facility];  Service: Gynecology;  Laterality: N/A;  Dr Suzanne Boron Diarmid to start case with a cysto at beginning of case while patient under anesthesia.   ENDOMETRIAL BIOPSY  09/19/12   neg   LAPAROSCOPY Bilateral 10/31/2012   Procedure: LAPAROSCOPY OPERATIVE;  Surgeon: Lyman Speller, MD;  Location: [facility];  Service: Gynecology;  Laterality: Bilateral;  reports that she quit smoking about 8 years ago. Her smoking use included cigarettes. She has never used smokeless tobacco. She reports current alcohol use of about 5.0 standard drinks per week. She reports that she does not use drugs.  Allergies  Allergen Reactions   Ciprofloxacin Nausea Only    Family History  Problem Relation Age of Onset   Stroke Father    Non-Hodgkin's lymphoma Father    Hypertension Father    Kidney disease Father     Hypertension Mother    Club foot Son     Prior to Admission medications   Medication Sig Start Date End Date Taking? Authorizing Provider  acetaminophen (TYLENOL) 325 MG tablet Take 650 mg by mouth every 6 (six) hours as needed for mild pain.   Yes [provider]  clobetasol ointment (TEMOVATE) 0.86 % Apply 1 application topically 2 (two) times daily. 07/27/19  Yes Megan Salon, MD  clonazePAM (KLONOPIN) 0.5 MG tablet TAKE 1 TABLET BY MOUTH TWICE DAILY AS NEEDED FOR ANXIETY 04/01/21  Yes Allwardt, Alyssa M, PA-C  Ibuprofen-Acetaminophen (ADVIL DUAL ACTION PO) Take 1 tablet by mouth as needed.   Yes [provider]  progesterone (PROMETRIUM) 100 MG capsule Take 100 mg by mouth at bedtime. 03/17/21  Yes [provider]  VITAMIN D PO Take by mouth.   Yes [provider]    Physical Exam: Vitals:   04/21/21 1520 04/21/21 1551 04/21/21 1725 04/21/21 1937  BP: 112/70 95/69 114/74 118/85  Pulse: 73 62 66 66  Resp: 16 16 (!) 23 18  Temp:    98.4 F (36.9 C)  TempSrc:    Oral  SpO2: 99% 99% 100% 98%  Weight:      Height:    _0  (1.626 m)    Physical Exam Constitutional:      General: She is not in acute distress. HENT:     Head: Normocephalic and atraumatic.  Eyes:     Extraocular Movements: Extraocular movements intact.     Conjunctiva/sclera: Conjunctivae normal.  Cardiovascular:     Rate and Rhythm: Normal rate and regular rhythm.     Pulses: Normal pulses.  Pulmonary:     Effort: Pulmonary effort is normal. No respiratory distress.     Breath sounds: Normal breath sounds. No wheezing or rales.  Abdominal:     General: Bowel sounds are normal. There is no distension.     Palpations: Abdomen is soft.     Tenderness: There is no abdominal tenderness.  Musculoskeletal:        General: No swelling or tenderness.     Cervical back: Normal range of motion and neck supple.  Skin:    General: Skin is warm and dry.  Neurological:      General: No focal deficit present.     Mental Status: She is alert and oriented to person, place, and time.     Labs on Admission: I have personally reviewed following labs and imaging studies  CBC: Recent Labs  Lab 04/21/21 1458  WBC 5.8  NEUTROABS 3.4  HGB 12.8  HCT 38.2  MCV 94.6  PLT 761   Basic Metabolic Panel: Recent Labs  Lab 04/21/21 1458  NA 141  K 3.9  CL 105  CO2 26  GLUCOSE 107*  BUN 15  CREATININE 0.65  CALCIUM 10.2   GFR: Estimated Creatinine Clearance: 71 mL/min (by C-G formula based on SCr of 0.65 mg/dL). Liver Function Tests: Recent Labs  Lab 04/21/21  1458  AST 21  ALT 20  ALKPHOS 72  BILITOT 0.8  PROT 7.9  ALBUMIN 4.9   Recent Labs  Lab 04/21/21 1458  LIPASE 18   No results for input(s): AMMONIA in the last 168 hours. Coagulation Profile: No results for input(s): INR, PROTIME in the last 168 hours. Cardiac Enzymes: No results for input(s): CKTOTAL, CKMB, CKMBINDEX, TROPONINI in the last 168 hours. BNP (last 3 results) No results for input(s): PROBNP in the last 8760 hours. HbA1C: No results for input(s): HGBA1C in the last 72 hours. CBG: No results for input(s): GLUCAP in the last 168 hours. Lipid Profile: No results for input(s): CHOL, HDL, LDLCALC, TRIG, CHOLHDL, LDLDIRECT in the last 72 hours. Thyroid Function Tests: No results for input(s): TSH, T4TOTAL, FREET4, T3FREE, THYROIDAB in the last 72 hours. Anemia Panel: No results for input(s): VITAMINB12, FOLATE, FERRITIN, TIBC, IRON, RETICCTPCT in the last 72 hours. Urine analysis:    Component Value Date/Time   COLORURINE YELLOW 05/17/2013 2004   APPEARANCEUR CLEAR 05/17/2013 2004   LABSPEC 1.014 05/17/2013 2004   PHURINE 5.0 05/17/2013 2004   GLUCOSEU NEGATIVE 05/17/2013 2004   HGBUR TRACE (A) 05/17/2013 2004   HGBUR moderate 04/17/2010 1020   BILIRUBINUR n 06/15/2019 [place] 05/17/2013 2004   PROTEINUR Negative 06/15/2019 0931   PROTEINUR 30 (A)  05/17/2013 2004   UROBILINOGEN 0.2 06/15/2019 0931   UROBILINOGEN 0.2 05/17/2013 2004   NITRITE n 06/15/2019 0931   NITRITE NEGATIVE 05/17/2013 2004   LEUKOCYTESUR Negative 06/15/2019 0931    Radiological Exams on Admission: CT Angio Chest PE W and/or Wo Contrast  Result Date: 04/21/2021 CLINICAL DATA:  Chest pain, back pain and diaphoresis. EXAM: CT ANGIOGRAPHY CHEST WITH CONTRAST TECHNIQUE: Multidetector CT imaging of the chest was performed using the standard protocol during bolus administration of intravenous contrast. Multiplanar CT image reconstructions and MIPs were obtained to evaluate the vascular anatomy. CONTRAST:  5m OMNIPAQUE IOHEXOL 350 MG/ML SOLN COMPARISON:  None. FINDINGS: Cardiovascular: The heart is normal in size. No pericardial effusion. The aorta is normal in caliber. Mild tortuosity. No dissection. The branch vessels are patent. Minimal scattered atherosclerotic calcifications. The pulmonary arterial tree is well opacified. No filling defects to suggest pulmonary embolism. Mediastinum/Nodes: No mediastinal or hilar mass or lymphadenopathy. The esophagus is unremarkable. Lungs/Pleura: The lungs are clear of an acute process. No worrisome pulmonary lesions or pulmonary nodules. No pleural effusions or pleural nodules. The central tracheobronchial tree is unremarkable. Upper Abdomen: No significant upper abdominal findings. Musculoskeletal: Bilateral breast prostheses are noted. No breast masses, supraclavicular or axillary adenopathy. The thyroid gland is unremarkable. The bony thorax is intact. Review of the MIP images confirms the above findings. IMPRESSION: 1. No CT findings for pulmonary embolism. 2. Normal thoracic aorta. 3. No acute pulmonary findings. Aortic Atherosclerosis (ICD10-I70.0). Electronically Signed   By: PMarijo SanesM.D.   On: 04/21/2021 16:11   DG Chest Portable 1 View  Result Date: 04/21/2021 CLINICAL DATA:  Chest pain EXAM: PORTABLE CHEST 1 VIEW  COMPARISON:  Chest radiograph dated April 28, 2020 FINDINGS: The heart size and mediastinal contours are within normal limits. Both lungs are clear. The visualized skeletal structures are unremarkable. IMPRESSION: No active disease. Electronically Signed   By: IKeane PoliceD.O.   On: 04/21/2021 15:13    Assessment/Plan Principal Problem:   Chest pain Active Problems:   GAD (generalized anxiety disorder)   Back pain   GERD (gastroesophageal reflux disease)   Chest pain Reports  family history of CAD.  Nuclear medicine stress test done in February 2018 was low risk, no recent evaluation done. Initial EKG revealed new ST depressions in inferior and lateral leads.  Patient was given nitroglycerin with improvement of her pain.  Repeat EKG showed resolution of ST depressions.  High-sensitivity troponin x2 negative.  Also complaining of mid back pain.  CT angiogram negative for PE or aortic dissection.  ED physician discussed the case with cardiology (Dr. Oval Linsey) who recommended admission for ACS rule out, suspected this could be coronary vasospasm.  Patien continues to complain of mild left-sided chest pain.  Hemodynamically stable and appears comfortable. -Cardiac monitoring.  Check UDS.  Serial EKGs and troponins.  Echocardiogram ordered to assess for wall motion abnormalities.  I have sent a message to cardiology requesting consultation in the morning.  If troponin or EKG abnormal, consult cardiology tonight.  GERD -GI cocktail  Mid back pain Likely musculoskeletal.  She is followed by sports medicine.  MRI of thoracic spine done 04/12/2021 was normal. -Outpatient follow-up  Anxiety -Continue home Klonopin  DVT prophylaxis: Lovenox Code Status: Full code Family Communication: No family available at this time. Disposition Plan: Status is: Observation  The patient remains OBS appropriate and will d/c before 2 midnights.  Level of care: Level of care: Telemetry Cardiac  The medical  decision making on this patient was of high complexity and the patient is at high risk for clinical deterioration, therefore this is a level 3 visit.  Shela Leff MD [facility]  If 7PM-7AM, please contact night-coverage [email or web address]  04/21/2021, 9:56 PM

## 2021-04-21 NOTE — ED Provider Notes (Addendum)
[facility] DEPT Provider Note   CSN: [id number] Arrival date & time: 04/21/21  1450     History Chief Complaint  Patient presents with   Chest Pain    [name] [name] is a 50 y.o. female.  The history is provided by the patient.  Chest Pain Pain location:  L chest Pain quality: not aching   Pain radiates to:  L jaw and upper back Pain severity:  Mild Onset quality:  Gradual Timing:  Intermittent Progression:  Waxing and waning Chronicity:  Recurrent Context: at rest   Relieved by:  Nothing Worsened by:  Exertion Associated symptoms: back pain, fatigue and shortness of breath   Associated symptoms: no abdominal pain, no altered mental status, no anxiety, no claudication, no cough, no dizziness, no dysphagia, no fever, no heartburn, no nausea, no numbness, no orthopnea, no palpitations and no vomiting   Risk factors: smoking (vapes)   Risk factors: no coronary artery disease, no diabetes mellitus, no high cholesterol, no hypertension and no prior DVT/PE       Past Medical History:  Diagnosis Date   Anxiety    Depression    GERD (gastroesophageal reflux disease)    History of back surgery 12/18/2018   IBS (irritable bowel syndrome)     Patient Active Problem List   Diagnosis Date Noted   GAD (generalized anxiety disorder) 08/01/2018   Seizure ([place]) 05/17/2013   Nausea alone 01/03/2013   Slow transit constipation 08/12/2011   Seasonal allergic rhinitis 08/12/2011   Tobacco use disorder 01/10/2011   HEMORRHOIDS 05/07/2010   Depression, recurrent ([place]) 04/17/2010   HEMATURIA UNSPECIFIED 04/17/2010    Past Surgical History:  Procedure Laterality Date   AUGMENTATION MAMMAPLASTY Bilateral 03/2017   BACK SURGERY Bilateral 12/18/2018   BILATERAL SALPINGECTOMY Bilateral 10/31/2012   Procedure: BILATERAL SALPINGECTOMY;  Surgeon: Lyman Speller, MD;  Location: [facility];  Service: Gynecology;  Laterality: Bilateral;   BREAST ENHANCEMENT  SURGERY  04/12/2017   CESAREAN SECTION     CYSTO WITH HYDRODISTENSION N/A 10/31/2012   Procedure: CYSTOSCOPY/HYDRODISTENSION;  Surgeon: Reece Packer, MD;  Location: [facility];  Service: Urology;  Laterality: N/A;   DILATION AND CURETTAGE OF UTERUS     DILITATION & CURRETTAGE/HYSTROSCOPY WITH NOVASURE ABLATION N/A 10/31/2012   Procedure: DILATATION & CURETTAGE/HYSTEROSCOPY WITH NOVASURE ABLATION;  Surgeon: Lyman Speller, MD;  Location: [facility];  Service: Gynecology;  Laterality: N/A;  Dr Suzanne Boron Diarmid to start case with a cysto at beginning of case while patient under anesthesia.   ENDOMETRIAL BIOPSY  09/19/12   neg   LAPAROSCOPY Bilateral 10/31/2012   Procedure: LAPAROSCOPY OPERATIVE;  Surgeon: Lyman Speller, MD;  Location: [facility];  Service: Gynecology;  Laterality: Bilateral;     OB History     Gravida  5   Para  1   Term      Preterm      AB  4   Living  1      SAB  1   IAB  3   Ectopic      Multiple      Live Births              Family History  Problem Relation Age of Onset   Stroke Father    Non-Hodgkin's lymphoma Father    Hypertension Father    Kidney disease Father    Hypertension Mother    Club foot Son     Social History   Tobacco Use  Smoking status: Former    Types: Cigarettes    Quit date: 01/15/2013    Years since quitting: 8.2   Smokeless tobacco: Never  Vaping Use   Vaping Use: Former   Quit date: 07/18/2018  Substance Use Topics   Alcohol use: Yes    Alcohol/week: 5.0 standard drinks    Types: 5 Standard drinks or equivalent per week   Drug use: No    Home Medications Prior to Admission medications   Medication Sig Start Date End Date Taking? Authorizing Provider  acetaminophen (TYLENOL) 325 MG tablet Take 650 mg by mouth every 6 (six) hours as needed for mild pain.   Yes [provider]  clobetasol ointment (TEMOVATE) 1.02 % Apply 1 application topically 2 (two) times daily. 07/27/19  Yes Megan Salon, MD   clonazePAM (KLONOPIN) 0.5 MG tablet TAKE 1 TABLET BY MOUTH TWICE DAILY AS NEEDED FOR ANXIETY 04/01/21  Yes Allwardt, Alyssa M, PA-C  Ibuprofen-Acetaminophen (ADVIL DUAL ACTION PO) Take 1 tablet by mouth as needed.   Yes [provider]  progesterone (PROMETRIUM) 100 MG capsule Take 100 mg by mouth at bedtime. 03/17/21  Yes [provider]  VITAMIN D PO Take by mouth.   Yes [provider]    Allergies    Ciprofloxacin  Review of Systems   Review of Systems  Constitutional:  Positive for fatigue. Negative for chills and fever.  HENT:  Negative for ear pain, sore throat and trouble swallowing.   Eyes:  Negative for pain and visual disturbance.  Respiratory:  Positive for shortness of breath. Negative for cough.   Cardiovascular:  Positive for chest pain. Negative for palpitations, orthopnea and claudication.  Gastrointestinal:  Negative for abdominal pain, heartburn, nausea and vomiting.  Genitourinary:  Negative for dysuria and hematuria.  Musculoskeletal:  Positive for back pain. Negative for arthralgias.  Skin:  Negative for color change and rash.  Neurological:  Negative for dizziness, seizures, syncope and numbness.  All other systems reviewed and are negative.  Physical Exam Updated Vital Signs BP 95/69 (BP Location: Right Arm)   Pulse 62   Temp 98.3 F (36.8 C)   Resp 16   Ht _0  (1.626 m)   Wt 61.2 kg   SpO2 99%   BMI 23.17 kg/m   Physical Exam Vitals and nursing note reviewed.  Constitutional:      General: She is not in acute distress.    Appearance: She is well-developed. She is not ill-appearing.  HENT:     Head: Normocephalic and atraumatic.  Eyes:     Extraocular Movements: Extraocular movements intact.     Conjunctiva/sclera: Conjunctivae normal.     Pupils: Pupils are equal, round, and reactive to light.  Cardiovascular:     Rate and Rhythm: Normal rate and regular rhythm.     Pulses:          Radial pulses are 2+ on the  right side and 2+ on the left side.     Heart sounds: Normal heart sounds. No murmur heard. Pulmonary:     Effort: Pulmonary effort is normal. No respiratory distress.     Breath sounds: Normal breath sounds. No decreased breath sounds or wheezing.  Chest:     Chest wall: No tenderness.  Abdominal:     Palpations: Abdomen is soft.     Tenderness: There is no abdominal tenderness.  Musculoskeletal:        General: No swelling.     Cervical back: Normal  range of motion and neck supple.  Skin:    General: Skin is warm and dry.     Capillary Refill: Capillary refill takes less than 2 seconds.  Neurological:     General: No focal deficit present.     Mental Status: She is alert.  Psychiatric:        Mood and Affect: Mood normal.    ED Results / Procedures / Treatments   Labs (all labs ordered are listed, but only abnormal results are displayed) Labs Reviewed  COMPREHENSIVE METABOLIC PANEL - Abnormal; Notable for the following components:      Result Value   Glucose, Bld 107 (*)    All other components within normal limits  RESP PANEL BY RT-PCR (FLU A&B, COVID) [id number]  CBC WITH DIFFERENTIAL/PLATELET  LIPASE, BLOOD  TROPONIN I (HIGH SENSITIVITY)  TROPONIN I (HIGH SENSITIVITY)    EKG EKG Interpretation  Date/Time:  Tuesday April 21 2021 15:50:15 EST Ventricular Rate:  62 PR Interval:  119 QRS Duration: 86 QT Interval:  404 QTC Calculation: 411 R Axis:   78 Text Interpretation: Sinus rhythm Borderline short PR interval RSR' in V1 or V2, probably normal variant Confirmed by Lennice Sites [phone]) on 04/21/2021 3:54:50 PM  Radiology CT Angio Chest PE W and/or Wo Contrast  Result Date: 04/21/2021 CLINICAL DATA:  Chest pain, back pain and diaphoresis. EXAM: CT ANGIOGRAPHY CHEST WITH CONTRAST TECHNIQUE: Multidetector CT imaging of the chest was performed using the standard protocol during bolus administration of intravenous contrast. Multiplanar CT image reconstructions and  MIPs were obtained to evaluate the vascular anatomy. CONTRAST:  63m OMNIPAQUE IOHEXOL 350 MG/ML SOLN COMPARISON:  None. FINDINGS: Cardiovascular: The heart is normal in size. No pericardial effusion. The aorta is normal in caliber. Mild tortuosity. No dissection. The branch vessels are patent. Minimal scattered atherosclerotic calcifications. The pulmonary arterial tree is well opacified. No filling defects to suggest pulmonary embolism. Mediastinum/Nodes: No mediastinal or hilar mass or lymphadenopathy. The esophagus is unremarkable. Lungs/Pleura: The lungs are clear of an acute process. No worrisome pulmonary lesions or pulmonary nodules. No pleural effusions or pleural nodules. The central tracheobronchial tree is unremarkable. Upper Abdomen: No significant upper abdominal findings. Musculoskeletal: Bilateral breast prostheses are noted. No breast masses, supraclavicular or axillary adenopathy. The thyroid gland is unremarkable. The bony thorax is intact. Review of the MIP images confirms the above findings. IMPRESSION: 1. No CT findings for pulmonary embolism. 2. Normal thoracic aorta. 3. No acute pulmonary findings. Aortic Atherosclerosis (ICD10-I70.0). Electronically Signed   By: PMarijo SanesM.D.   On: 04/21/2021 16:11   DG Chest Portable 1 View  Result Date: 04/21/2021 CLINICAL DATA:  Chest pain EXAM: PORTABLE CHEST 1 VIEW COMPARISON:  Chest radiograph dated April 28, 2020 FINDINGS: The heart size and mediastinal contours are within normal limits. Both lungs are clear. The visualized skeletal structures are unremarkable. IMPRESSION: No active disease. Electronically Signed   By: IKeane PoliceD.O.   On: 04/21/2021 15:13    Procedures Procedures   Medications Ordered in ED Medications  nitroGLYCERIN (NITROSTAT) SL tablet 0.4 mg (0.4 mg Sublingual Given 04/21/21 1519)  iohexol (OMNIPAQUE) 350 MG/ML injection 75 mL (75 mLs Intravenous Contrast Given 04/21/21 1556)    ED Course  I have  reviewed the triage vital signs and the nursing notes.  Pertinent labs & imaging results that were available during my care of the patient were reviewed by me and considered in my medical decision making (see chart for details).  MDM Rules/Calculators/A&P                           Carsen D [name] is here with chest pain.  No major significant medical problems.  She has been having some intermittent back pain for a long time but more onset of chest pain recently as well.  Felt some chest pain earlier today that radiated to her jaw and made her feel lightheaded and sweaty.  She felt like she was going to have a bowel movement as well.  Continues to feel some chronic back pain at times as well.  Still having some mild chest pain even at rest.  EKG shows sinus rhythm.  There are ST depressions inferiorly and laterally.  Old EKG show inferior depressions but lateral depressions appear new.  Patient was given nitroglycerin with improvement of her pain.  Repeat EKG was done that showed resolution of ST depressions.  She did have stress test last year that showed a septal defect but was felt not to be consistent with ischemia.  Has not had a heart cath.  Does not really have cardiac risk factors.  She does vape.  We will get lab work including troponins.  We will get a PE scan as well.  PE scan is unremarkable.  Talked with Dr. Oval Linsey with cardiology and agrees with admission to medicine for further ACS rule out.  Suspect that this could be coronary vasospasm.  This chart was dictated using voice recognition software.  Despite best efforts to proofread,  errors can occur which can change the documentation meaning.   Final Clinical Impression(s) / ED Diagnoses Final diagnoses:  Chest pain, unspecified type    Rx / DC Orders ED Discharge Orders     None        Lennice Sites, DO 04/21/21 [place], [place], DO 04/21/21 1717

## 2021-04-21 NOTE — ED Triage Notes (Signed)
Pt presents with back pain radiating into her chest pt states that is ongoing and seeing [facility] for pain control. Pt reports a sudden onset of feeling clammy, diaphoretic, light headed and 2 episodes of diarrhea this am associated with chest pain and Left side jaw pain

## 2021-04-21 NOTE — ED Notes (Signed)
ED Provider at bedside. 

## 2021-04-22 ENCOUNTER — Ambulatory Visit: Payer: 59 | Admitting: Family Medicine

## 2021-04-22 ENCOUNTER — Observation Stay (HOSPITAL_BASED_OUTPATIENT_CLINIC_OR_DEPARTMENT_OTHER): Payer: 59

## 2021-04-22 ENCOUNTER — Encounter (HOSPITAL_COMMUNITY): Payer: Self-pay | Admitting: Internal Medicine

## 2021-04-22 ENCOUNTER — Observation Stay (HOSPITAL_COMMUNITY): Payer: 59

## 2021-04-22 DIAGNOSIS — R079 Chest pain, unspecified: Secondary | ICD-10-CM

## 2021-04-22 LAB — ECHOCARDIOGRAM COMPLETE
Area-P 1/2: 3.42 cm2
Calc EF: 65.7 %
Height: 64 in
S' Lateral: 2.55 cm
Single Plane A2C EF: 68.6 %
Single Plane A4C EF: 63 %
Weight: 2140.8 oz

## 2021-04-22 LAB — LIPID PANEL
Cholesterol: 174 mg/dL (ref 0–200)
HDL: 67 mg/dL (ref >40)
LDL Cholesterol: 96 mg/dL (ref 0–99)
Total CHOL/HDL Ratio: 2.6 RATIO
Triglycerides: 56 mg/dL (ref <150)
VLDL: 11 mg/dL (ref 0–40)

## 2021-04-22 LAB — TROPONIN I (HIGH SENSITIVITY)
Troponin I (High Sensitivity): <2 ng/L (ref <18)
Troponin I (High Sensitivity): <2 ng/L (ref <18)

## 2021-04-22 LAB — HIV ANTIBODY (ROUTINE TESTING W REFLEX): HIV Screen 4th Generation wRfx: NONREACTIVE

## 2021-04-22 MED ORDER — NITROGLYCERIN 0.4 MG SL SUBL
0.4000 mg | SUBLINGUAL_TABLET | SUBLINGUAL | 0 refills | Status: DC | PRN
Start: 2021-04-22 — End: 2021-12-07

## 2021-04-22 MED ORDER — NITROGLYCERIN 0.4 MG SL SUBL: 0.8000 mg | SUBLINGUAL_TABLET | Freq: Once | SUBLINGUAL | Status: DC

## 2021-04-22 MED ORDER — SODIUM CHLORIDE 0.9 % IV SOLN: INTRAVENOUS | Status: DC

## 2021-04-22 MED ORDER — METOPROLOL TARTRATE 5 MG/5ML IV SOLN: 2.5000 mg | Freq: Four times a day (QID) | INTRAVENOUS | Status: DC | PRN

## 2021-04-22 MED ORDER — ASPIRIN 81 MG PO TBEC: 81.0000 mg | DELAYED_RELEASE_TABLET | Freq: Every day | ORAL | 0 refills | Status: DC

## 2021-04-22 MED ORDER — LORAZEPAM 2 MG/ML IJ SOLN: 0.5000 mg | Freq: Once | INTRAMUSCULAR | Status: AC

## 2021-04-22 MED ORDER — LORAZEPAM 2 MG/ML IJ SOLN
INTRAMUSCULAR | Status: AC
  Administered 2021-04-22: 0.5 mg via INTRAVENOUS
  Filled 2021-04-22: qty 1

## 2021-04-22 MED ORDER — LORAZEPAM 2 MG/ML IJ SOLN
0.5000 mg | Freq: Once | INTRAMUSCULAR | Status: AC
  Administered 2021-04-22: 0.5 mg via INTRAVENOUS

## 2021-04-22 MED ORDER — ROSUVASTATIN CALCIUM 5 MG PO TABS: 5.0000 mg | ORAL_TABLET | Freq: Every day | ORAL | Status: DC

## 2021-04-22 MED ORDER — NITROGLYCERIN 0.4 MG SL SUBL
SUBLINGUAL_TABLET | SUBLINGUAL | Status: AC
  Filled 2021-04-22: qty 2

## 2021-04-22 MED ORDER — ROSUVASTATIN CALCIUM 5 MG PO TABS: 5.0000 mg | ORAL_TABLET | Freq: Every day | ORAL | 0 refills | Status: DC

## 2021-04-22 MED ORDER — ASPIRIN EC 81 MG PO TBEC: 81.0000 mg | DELAYED_RELEASE_TABLET | Freq: Every day | ORAL | Status: DC

## 2021-04-22 NOTE — Progress Notes (Signed)
Patient arrived to CT for a cardiac CT scan. Pt was given instructions and set up on scanner.  HR shot up to 80s-120s.  Pt reported anxiety.  Music was played, a wet wash cloth was placed over patients eyes with without seeing a decrease to HR.  Dr. Harriet Masson called and got order for 0.5mg  ativan x 2 but pt's HR remained too fast for scan. Dr. Harriet Masson was again notified and scan aborted. Pt went back to room and inpatient RN made aware of medications given and unsuccessful attempt at scan.

## 2021-04-22 NOTE — Discharge Summary (Addendum)
, Discharge Summary  Christmas Faraci [name] ZTI:[id number] DOB: 1971-05-23  PCP: Fredirick Lathe, PA-C  Admit date: 04/21/2021 Discharge date: 04/22/2021  Time spent: 35 minutes   Recommendations for Outpatient Follow-up:  Follow up with your PCP Follow up with cardiology Take your medications as prescribed   Discharge Diagnoses:  Active Hospital Problems   Diagnosis Date Noted   Chest pain 04/21/2021   Back pain 04/21/2021   GERD (gastroesophageal reflux disease) 04/21/2021   GAD (generalized anxiety disorder) 08/01/2018    Resolved Hospital Problems  No resolved problems to display.    Discharge Condition: Stable   Diet recommendation: Resume previous diet   Vitals:   04/22/21 1052 04/22/21 1543  BP: 103/89 122/76  Pulse: 64   Resp: 17   Temp: 97.8 F (36.6 C)   SpO2: 98%     History of present illness:  [name] [name] is a 50 y.o. female with medical history significant of anxiety, depression, GERD, IBS, chronic back pain presented to the ED complaining of chest pain.  Vital signs stable.  Initial EKG revealed new ST depressions in inferior and lateral leads.  Patient was given nitroglycerin with improvement of her pain.  Repeat EKG showed resolution of ST depressions.  High-sensitivity troponin x2 negative.  CT angiogram negative for PE.  ED physician discussed the case with cardiology (Dr. Oval Linsey) who recommended admission for ACS rule out, suspected this could be coronary vasospasm.   Patient states she is having ongoing pain in the middle of her back for the past 2 years.  The pain is worse when she lays down flat at night and radiates to her chest and left side.  States she is seen by sports medicine and had an MRI of her thoracic spine done a week ago which was normal.  The pain has gotten progressively worse over time.  It is not necessarily exertional as she is otherwise physically active and walks on her treadmill every day without experiencing any symptoms.   States yesterday she went to a client's house and was not feeling well.  She felt lightheaded and felt like she was going to pass out.  She experienced pain on the left side of her chest which radiated to her jaw and left arm.  She was clammy and felt the urge to have a bowel movement.  Symptoms lasted about 1.5 hours.  States she felt better after she received nitroglycerin in the emergency room.  She is also having a lot of acid reflux and requesting a medication.  She continues to have some mild left-sided chest discomfort at this time.  States her father had a heart attack later on in life and had to undergo open heart surgery.  She takes a medication for anxiety but denies any other drug use.  04/22/21:  Seen at her bedside.  Her husband was present in the room.  Her chest pain has resolved.  No dyspnea or tachycardia.    Seen by cardiology planned for CTA coronary this afternoon which she was unable to get.  CTA coronary was not performed due to uncontrolled heart rate for CT.  Patient states she was anxious and in pain from IV in antecubital fossae.  Per cardiology she will have a heart cath on Tuesday, 04/29/2019 by Dr. Peter Martinique.  Advised to be at admitting at 9:45 AM, the procedure will be at [facility] Course:  Principal Problem:   Chest pain Active Problems:   GAD (generalized  anxiety disorder)   Back pain   GERD (gastroesophageal reflux disease)   Chest pain Reports family history of CAD.  Nuclear medicine stress test done in February 2018 was low risk, no recent evaluation done. Initial EKG revealed new ST depressions in inferior and lateral leads.  Patient was given nitroglycerin with improvement of her pain.  Repeat EKG showed resolution of ST depressions.  High-sensitivity troponin x2 negative.  Also complaining of mid back pain.  CT angiogram negative for PE or aortic dissection.  ED physician discussed the case with cardiology (Dr. Oval Linsey) who recommended admission for  ACS rule out, suspected this could be coronary vasospasm.  2D echo unrevealing. Heart heart cath outpatient   Hyperlipidemia LDL 96 Start Crestor as recommended by cardiology. Also started aspirin 81 mg daily as recommended by cardiology.   GERD -GI cocktail   Mid back pain Likely musculoskeletal.  She is followed by sports medicine.  MRI of thoracic spine done 04/12/2021 was normal. -Outpatient follow-up   Anxiety -Continue home Klonopin    Code Status: Full code   Procedures: CTA coronary   Consultations: Cardiology   Discharge Exam: BP 122/76   Pulse 64   Temp 97.8 F (36.6 C) (Oral)   Resp 17   Ht 5\' 4"  (1.626 m)   Wt 60.7 kg Comment: scale a  SpO2 98%   BMI 22.97 kg/m  General: 50 y.o. year-old female well developed well nourished in no acute distress.  Alert and oriented x3. Cardiovascular: Regular rate and rhythm with no rubs or gallops.  No thyromegaly or JVD noted.   Respiratory: Clear to auscultation with no wheezes or rales. Good inspiratory effort. Abdomen: Soft nontender nondistended with normal bowel sounds x4 quadrants. Musculoskeletal: No lower extremity edema. 2/4 pulses in all 4 extremities. Skin: No ulcerative lesions noted or rashes, Psychiatry: Mood is appropriate for condition and setting  Discharge Instructions You were cared for by a hospitalist during your hospital stay. If you have any questions about your discharge medications or the care you received while you were in the hospital after you are discharged, you can call the unit and asked to speak with the hospitalist on call if the hospitalist that took care of you is not available. Once you are discharged, your primary care physician will handle any further medical issues. Please note that NO REFILLS for any discharge medications will be authorized once you are discharged, as it is imperative that you return to your primary care physician (or establish a relationship with a primary care  physician if you do not have one) for your aftercare needs so that they can reassess your need for medications and monitor your lab values.   Allergies as of 04/22/2021       Reactions   Ciprofloxacin Nausea Only        Medication List     TAKE these medications    acetaminophen 325 MG tablet Commonly known as: TYLENOL Take 650 mg by mouth every 6 (six) hours as needed for mild pain.   ADVIL DUAL ACTION PO Take 1 tablet by mouth as needed.   aspirin 81 MG EC tablet Take 1 tablet (81 mg total) by mouth daily. Swallow whole.   clobetasol ointment 0.05 % Commonly known as: TEMOVATE Apply 1 application topically 2 (two) times daily.   clonazePAM 0.5 MG tablet Commonly known as: KLONOPIN TAKE 1 TABLET BY MOUTH TWICE DAILY AS NEEDED FOR ANXIETY   nitroGLYCERIN 0.4 MG SL tablet Commonly known as:  NITROSTAT Place 1 tablet (0.4 mg total) under the tongue every 5 (five) minutes as needed for chest pain.   progesterone 100 MG capsule Commonly known as: PROMETRIUM Take 100 mg by mouth at bedtime.   rosuvastatin 5 MG tablet Commonly known as: CRESTOR Take 1 tablet (5 mg total) by mouth daily.   VITAMIN D PO Take by mouth.       Allergies  Allergen Reactions   Ciprofloxacin Nausea Only    Follow-up Information     Allwardt, Alyssa M, PA-C. Call today.   Specialty: Physician Assistant Why: Please call for a post hospital follow up appointment Contact information: [place] [address] [address], [place], [place]. Call in 1 day(s).   Specialty: Cardiology Why: Please call for a post hospital follow up appointment. Contact information: [place] [address] [phone]                  The results of significant diagnostics from this hospitalization (including imaging, microbiology, ancillary and laboratory) are listed below for reference.    Significant Diagnostic Studies: CT Angio  Chest PE W and/or Wo Contrast  Result Date: 04/21/2021 CLINICAL DATA:  Chest pain, back pain and diaphoresis. EXAM: CT ANGIOGRAPHY CHEST WITH CONTRAST TECHNIQUE: Multidetector CT imaging of the chest was performed using the standard protocol during bolus administration of intravenous contrast. Multiplanar CT image reconstructions and MIPs were obtained to evaluate the vascular anatomy. CONTRAST:  16mL OMNIPAQUE IOHEXOL 350 MG/ML SOLN COMPARISON:  None. FINDINGS: Cardiovascular: The heart is normal in size. No pericardial effusion. The aorta is normal in caliber. Mild tortuosity. No dissection. The branch vessels are patent. Minimal scattered atherosclerotic calcifications. The pulmonary arterial tree is well opacified. No filling defects to suggest pulmonary embolism. Mediastinum/Nodes: No mediastinal or hilar mass or lymphadenopathy. The esophagus is unremarkable. Lungs/Pleura: The lungs are clear of an acute process. No worrisome pulmonary lesions or pulmonary nodules. No pleural effusions or pleural nodules. The central tracheobronchial tree is unremarkable. Upper Abdomen: No significant upper abdominal findings. Musculoskeletal: Bilateral breast prostheses are noted. No breast masses, supraclavicular or axillary adenopathy. The thyroid gland is unremarkable. The bony thorax is intact. Review of the MIP images confirms the above findings. IMPRESSION: 1. No CT findings for pulmonary embolism. 2. Normal thoracic aorta. 3. No acute pulmonary findings. Aortic Atherosclerosis (ICD10-I70.0). Electronically Signed   By: Marijo Sanes M.D.   On: 04/21/2021 16:11   MR THORACIC SPINE WO CONTRAST  Result Date: 04/13/2021 CLINICAL DATA:  50 year old female with left side mid back pain, persistent pain under the left rib cage radiating to the chest and armpit. EXAM: MRI THORACIC SPINE WITHOUT CONTRAST TECHNIQUE: Multiplanar, multisequence MR imaging of the thoracic spine was performed. No intravenous contrast was  administered. COMPARISON:  Chest radiographs 04/28/2020 and earlier. Lumbar MRI 11/23/2018. FINDINGS: Limited cervical spine imaging:  Unremarkable. Thoracic spine segmentation: Normal on the comparison radiographs, although hypoplastic ribs at T12. Alignment:  Normal thoracic kyphosis. No spondylolisthesis. Vertebrae: No marrow edema or evidence of acute osseous abnormality. Visualized bone marrow signal is within normal limits. The visible posterior ribs appear symmetric and normal. Cord: Normal. Capacious thoracic spinal canal. Normal conus medullaris at T12. Paraspinal and other soft tissues: Negative. Paraspinal soft tissues appears symmetric and normal. Disc levels: C7-T1: Negative. T1-T2: Negative. T2-T3: Negative. T3-T4: Negative. T4-T5: Negative. T5-T6: Negative. T6-T7: Negative. T7-T8: Negative. T8-T9: Negative. T9-T10: Negative. T10-T11: Negative. T11-T12: Negative.  T12-L1: Negative. IMPRESSION: Normal MRI appearance of the thoracic spine. No explanation for left side pain. Electronically Signed   By: Genevie Ann M.D.   On: 04/13/2021 08:18   DG Chest Portable 1 View  Result Date: 04/21/2021 CLINICAL DATA:  Chest pain EXAM: PORTABLE CHEST 1 VIEW COMPARISON:  Chest radiograph dated April 28, 2020 FINDINGS: The heart size and mediastinal contours are within normal limits. Both lungs are clear. The visualized skeletal structures are unremarkable. IMPRESSION: No active disease. Electronically Signed   By: Keane Police D.O.   On: 04/21/2021 15:13   ECHOCARDIOGRAM COMPLETE  Result Date: 04/22/2021    ECHOCARDIOGRAM REPORT   Patient Name:   [name] [name] Date of Exam: 04/22/2021 Medical Rec #:  [id number]       Height:       64.0 in Accession #:    [id number]      Weight:       133.8 lb Date of Birth:  04-26-1971       BSA:          1.649 m Patient Age:    87 years        BP:           96/60 mmHg Patient Gender: F               HR:           59 bpm. Exam Location:  Inpatient Procedure: 2D Echo, 3D  Echo, Cardiac Doppler and Color Doppler Indications:    R07.9* Chest pain, unspecified  History:        Patient has prior history of Echocardiogram examinations, most                 recent 08/04/2018. Signs/Symptoms:Chest Pain and                 Dizziness/Lightheadedness; Risk Factors:Current Smoker.  Sonographer:    Roseanna Rainbow RDCS Referring Phys: [id number] [facility]  Sonographer Comments: Suboptimal parasternal window and Technically difficult study due to poor echo windows. Image acquisition challenging due to breast implants. IMPRESSIONS  1. Left ventricular ejection fraction, by estimation, is 55 to 60%. Left ventricular ejection fraction by 3D volume is 59 %. The left ventricle has normal function. The left ventricle has no regional wall motion abnormalities. There is mild left ventricular hypertrophy. Left ventricular diastolic parameters were normal.  2. Right ventricular systolic function is normal. The right ventricular size is normal. There is normal pulmonary artery systolic pressure.  3. The mitral valve is normal in structure. Trivial mitral valve regurgitation.  4. The aortic valve was not well visualized. Aortic valve regurgitation is not visualized. No aortic stenosis is present.  5. The inferior vena cava is normal in size with greater than 50% respiratory variability, suggesting right atrial pressure of 3 mmHg. FINDINGS  Left Ventricle: Left ventricular ejection fraction, by estimation, is 55 to 60%. Left ventricular ejection fraction by 3D volume is 59 %. The left ventricle has normal function. The left ventricle has no regional wall motion abnormalities. The left ventricular internal cavity size was normal in size. There is mild left ventricular hypertrophy. Left ventricular diastolic parameters were normal. Right Ventricle: The right ventricular size is normal. No increase in right ventricular wall thickness. Right ventricular systolic function is normal. There is normal pulmonary artery  systolic pressure. The tricuspid regurgitant velocity is 1.77 m/s, and  with an assumed right atrial pressure of 3 mmHg, the estimated right ventricular systolic  pressure is 15.5 mmHg. Left Atrium: Left atrial size was normal in size. Right Atrium: Right atrial size was normal in size. Pericardium: Trivial pericardial effusion is present. Mitral Valve: The mitral valve is normal in structure. Trivial mitral valve regurgitation. Tricuspid Valve: The tricuspid valve is normal in structure. Tricuspid valve regurgitation is trivial. Aortic Valve: The aortic valve was not well visualized. Aortic valve regurgitation is not visualized. No aortic stenosis is present. Pulmonic Valve: The pulmonic valve was not well visualized. Pulmonic valve regurgitation is not visualized. Aorta: The aortic root and ascending aorta are structurally normal, with no evidence of dilitation. Venous: The inferior vena cava is normal in size with greater than 50% respiratory variability, suggesting right atrial pressure of 3 mmHg. IAS/Shunts: The interatrial septum was not well visualized.  LEFT VENTRICLE PLAX 2D LVIDd:         3.40 cm         Diastology LVIDs:         2.55 cm         LV e' medial:    9.79 cm/s LV PW:         1.30 cm         LV E/e' medial:  9.1 LV IVS:        1.10 cm         LV e' lateral:   12.60 cm/s LVOT diam:     1.70 cm         LV E/e' lateral: 7.0 LV SV:         50 LV SV Index:   31 LVOT Area:     2.27 cm        3D Volume EF                                LV 3D EF:    Left                                             ventricul LV Volumes (MOD)                            ar LV vol d, MOD    62.2 ml                    ejection A2C:                                        fraction LV vol d, MOD    37.3 ml                    by 3D A4C:                                        volume is LV vol s, MOD    19.5 ml                    59 %. A2C: LV vol s, MOD    13.8 ml A4C:  3D Volume EF: LV SV MOD A2C:   42.7  ml       3D EF:        59 % LV SV MOD A4C:   37.3 ml       LV EDV:       93 ml LV SV MOD BP:    32.0 ml       LV ESV:       38 ml                                LV SV:        54 ml RIGHT VENTRICLE             IVC RV S prime:     10.10 cm/s  IVC diam: 1.60 cm TAPSE (M-mode): 1.8 cm LEFT ATRIUM             Index        RIGHT ATRIUM           Index LA diam:        3.00 cm 1.82 cm/m   RA Area:     11.60 cm LA Vol (A2C):   34.6 ml 20.98 ml/m  RA Volume:   23.90 ml  14.49 ml/m LA Vol (A4C):   24.8 ml 15.04 ml/m LA Biplane Vol: 29.7 ml 18.01 ml/m  AORTIC VALVE LVOT Vmax:   105.00 cm/s LVOT Vmean:  66.600 cm/s LVOT VTI:    0.222 m  AORTA Ao Root diam: 2.60 cm Ao Asc diam:  2.70 cm MITRAL VALVE               TRICUSPID VALVE MV Area (PHT): 3.42 cm    TR Peak grad:   12.5 mmHg MV Decel Time: 222 msec    TR Vmax:        177.00 cm/s MV E velocity: 88.70 cm/s MV A velocity: 62.00 cm/s  SHUNTS MV E/A ratio:  1.43        Systemic VTI:  0.22 m                            Systemic Diam: 1.70 cm Oswaldo Milian MD Electronically signed by Oswaldo Milian MD Signature Date/Time: 04/22/2021/10:34:10 AM    Final     Microbiology: Recent Results (from the past 240 hour(s))  Resp Panel by RT-PCR (Flu A&B, Covid) Nasopharyngeal Swab     Status: None   Collection Time: 04/21/21  3:07 PM   Specimen: Nasopharyngeal Swab; Nasopharyngeal(NP) swabs in vial transport medium  Result Value Ref Range Status   SARS Coronavirus 2 by RT PCR NEGATIVE NEGATIVE Final    Comment: (NOTE) SARS-CoV-2 target nucleic acids are NOT DETECTED.  The SARS-CoV-2 RNA is generally detectable in upper respiratory specimens during the acute phase of infection. The lowest concentration of SARS-CoV-2 viral copies this assay can detect is 138 copies/mL. A negative result does not preclude SARS-Cov-2 infection and should not be used as the sole basis for treatment or other patient management decisions. A negative result may occur with   improper specimen collection/handling, submission of specimen other than nasopharyngeal swab, presence of viral mutation(s) within the areas targeted by this assay, and inadequate number of viral copies(<138 copies/mL). A negative result must be combined with clinical observations, patient history, and epidemiological information. The expected result is Negative.  Fact Sheet for Patients:  EntrepreneurPulse.com.au  Fact Sheet for Healthcare Providers:  IncredibleEmployment.be  This test is no t yet approved or cleared by the [place] FDA and  has been authorized for detection and/or diagnosis of SARS-CoV-2 by FDA under an Emergency Use Authorization (EUA). This EUA will remain  in effect (meaning this test can be used) for the duration of the COVID-19 declaration under Section 564(b)(1) of the Act, 21 U.S.C.section 360bbb-3(b)(1), unless the authorization is terminated  or revoked sooner.       Influenza A by PCR NEGATIVE NEGATIVE Final   Influenza B by PCR NEGATIVE NEGATIVE Final    Comment: (NOTE) The Xpert Xpress SARS-CoV-2/FLU/RSV plus assay is intended as an aid in the diagnosis of influenza from Nasopharyngeal swab specimens and should not be used as a sole basis for treatment. Nasal washings and aspirates are unacceptable for Xpert Xpress SARS-CoV-2/FLU/RSV testing.  Fact Sheet for Patients: EntrepreneurPulse.com.au  Fact Sheet for Healthcare Providers: IncredibleEmployment.be  This test is not yet approved or cleared by the [place] FDA and has been authorized for detection and/or diagnosis of SARS-CoV-2 by FDA under an Emergency Use Authorization (EUA). This EUA will remain in effect (meaning this test can be used) for the duration of the COVID-19 declaration under Section 564(b)(1) of the Act, 21 U.S.C. section 360bbb-3(b)(1), unless the authorization is terminated  or revoked.  Performed at [facility], [address], [place], [place] [address]      Labs: Basic Metabolic Panel: Recent Labs  Lab 04/21/21 1458  NA 141  K 3.9  CL 105  CO2 26  GLUCOSE 107*  BUN 15  CREATININE 0.65  CALCIUM 10.2   Liver Function Tests: Recent Labs  Lab 04/21/21 1458  AST 21  ALT 20  ALKPHOS 72  BILITOT 0.8  PROT 7.9  ALBUMIN 4.9   Recent Labs  Lab 04/21/21 1458  LIPASE 18   No results for input(s): AMMONIA in the last 168 hours. CBC: Recent Labs  Lab 04/21/21 1458  WBC 5.8  NEUTROABS 3.4  HGB 12.8  HCT 38.2  MCV 94.6  PLT 267   Cardiac Enzymes: No results for input(s): CKTOTAL, CKMB, CKMBINDEX, TROPONINI in the last 168 hours. BNP: BNP (last 3 results) No results for input(s): BNP in the last 8760 hours.  ProBNP (last 3 results) No results for input(s): PROBNP in the last 8760 hours.  CBG: No results for input(s): GLUCAP in the last 168 hours.     Signed:  Kayleen Memos, MD [facility] 04/22/2021, 5:40 PM

## 2021-04-22 NOTE — Progress Notes (Signed)
EKG done upon admission, did not crossed over. Placed in chart. Sinus [name] [name].

## 2021-04-22 NOTE — Progress Notes (Signed)
  Echocardiogram 2D Echocardiogram has been performed.  [name] [name] 04/22/2021, 8:26 AM

## 2021-04-22 NOTE — Progress Notes (Signed)
SPoke to CT staff/MD   Unable to get HR controlled for CT Patient says she was anxious and in pain from IV in antecubital fossa  Recomm:   Since trop neg would send home on 81 mg ecasa and 5 mg Crestor     Cath set up for Tues with Peter Martinique MD   Be at admitting at 9:45  Procedure at 12:00  Take it easy this weekend   If comes back, cant control then come back to ED  Dorris Carnes MD

## 2021-04-22 NOTE — Consult Note (Addendum)
Cardiology Consultation:   Patient ID: [name] [name] MRN: [id number]; DOB: 1971-01-01  Admit date: 04/21/2021 Date of Consult: 04/22/2021  PCP:  Fredirick Lathe, PA-C   [facility] Providers Cardiologist:  None   Will be new    Patient Profile:   [name] [name] is a 50 y.o. female with a hx of PMH of IBS, GERD and anxiety  who is being seen 04/22/2021 for the evaluation of chest pain  at the request of Dr. Wayna Chalet.  He works as a Engineer, maintenance (IT) and has underlying anxiety. She smoked tobacco for 15 years.  Currently using Vapors for past 8 years.  Seen by me February 2018 for chest pain concerning for typical and atypical features.  EKG with nonspecific ST changes in inferior leads.  Exercise Myoview 07/08/16 Nuclear stress EF: 54%. Upsloping ST segment depression ST segment depression of 1 mm was noted during stress in the V5 and V6 leads, beginning at 10 minutes of stress, and returning to baseline after less than 1 minute of recovery. Defect 1: There is a medium defect of moderate severity present in the mid anteroseptal, mid inferoseptal and apical septal location. Low risk study with a fixed mid septal defect, likely an artifact. Ischemia is not seen. Low normal LV systolic function. Cannot exclude old septal scar, but the anatomical distribution is not typical for coronary disease. LBBB was not present at baseline or during exercise.  Symptoms were completely resolved after cessation of caffeine intake and increasing fluid intake during follow-up.  As needed office visit recommended.  History of Present Illness:   [name] presented yesterday with multiple complaints.  She reports for past 2 years she is dealing with left-sided upper back pain radiating to her chest.  This is mostly occurs at night while laying down.  Symptoms last for about 1 hour or so and resolves.  She drinks 1 cup of coffee in the morning.  Still dealing with stress and anxiety.  Patient has an apple watch  and took some EKG.  On personal evaluation, sinus rhythm with heart rate in 60s.  She has a evaluation by primary care provider for ongoing symptoms.  Echocardiogram March 2020 showed normal LV function without structural abnormality.  There was a plan for monitor but never placed.  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  She suddenly felt back pain radiating to her chest.  She was clammy and had associated shortness of breath.  Symptoms persisted and came to ER for further evaluation. She was given SL nitro x 1 with improvement.   Urine drug screen positive for benzodiazepine Respiratory panel negative for influenza and COVID CT angio of chest negative for pulmonary embolism.  Normal thoracic aorta.  Aortic atherosclerosis. High-sensitivity troponin negative  Electrolytes and renal function are normal  Past Medical History:  Diagnosis Date   Anxiety    Depression    GERD (gastroesophageal reflux disease)    History of back surgery 12/18/2018   IBS (irritable bowel syndrome)     Past Surgical History:  Procedure Laterality Date   AUGMENTATION MAMMAPLASTY Bilateral 03/2017   BACK SURGERY Bilateral 12/18/2018   BILATERAL SALPINGECTOMY Bilateral 10/31/2012   Procedure: BILATERAL SALPINGECTOMY;  Surgeon: Lyman Speller, MD;  Location: [facility];  Service: Gynecology;  Laterality: Bilateral;   BREAST ENHANCEMENT SURGERY  04/12/2017   CESAREAN SECTION     CYSTO WITH HYDRODISTENSION N/A 10/31/2012   Procedure: CYSTOSCOPY/HYDRODISTENSION;  Surgeon: Reece Packer, MD;  Location: [facility];  Service: Urology;  Laterality: N/A;   DILATION AND CURETTAGE OF UTERUS     DILITATION & CURRETTAGE/HYSTROSCOPY WITH NOVASURE ABLATION N/A 10/31/2012   Procedure: DILATATION & CURETTAGE/HYSTEROSCOPY WITH NOVASURE ABLATION;  Surgeon: Lyman Speller, MD;  Location: [facility];  Service: Gynecology;  Laterality: N/A;  Dr Suzanne Boron Diarmid to start case with a cysto at beginning of case while  patient under anesthesia.   ENDOMETRIAL BIOPSY  09/19/12   neg   LAPAROSCOPY Bilateral 10/31/2012   Procedure: LAPAROSCOPY OPERATIVE;  Surgeon: Lyman Speller, MD;  Location: [facility];  Service: Gynecology;  Laterality: Bilateral;   Inpatient Medications: Scheduled Meds:  enoxaparin (LOVENOX) injection  40 mg Subcutaneous Q2000   Continuous Infusions:  PRN Meds: acetaminophen **OR** acetaminophen, clonazePAM  Allergies:    Allergies  Allergen Reactions   Ciprofloxacin Nausea Only    Social History:   Social History   Socioeconomic History   Marital status: Divorced    Spouse name: Not on file   Number of children: Not on file   Years of education: Not on file   Highest education level: Not on file  Occupational History   Not on file  Tobacco Use   Smoking status: Former    Types: Cigarettes    Quit date: 01/15/2013    Years since quitting: 8.2   Smokeless tobacco: Never  Vaping Use   Vaping Use: Former   Quit date: 07/18/2018  Substance and Sexual Activity   Alcohol use: Yes    Alcohol/week: 5.0 standard drinks    Types: 5 Standard drinks or equivalent per week   Drug use: No   Sexual activity: Yes    Partners: Male    Birth control/protection: Surgical    Comment: BSO  Other Topics Concern   Not on file  Social History Narrative   Married   1 son (2003)   Engineer, maintenance (IT)         Social Determinants of Radio broadcast assistant Strain: Not on file  Food Insecurity: Not on file  Transportation Needs: Not on file  Physical Activity: Not on file  Stress: Not on file  Social Connections: Not on file  Intimate Partner Violence: Not on file    Family History:   Family History  Problem Relation Age of Onset   Stroke Father    Non-Hodgkin's lymphoma Father    Hypertension Father    Kidney disease Father    Hypertension Mother    Club foot Son      ROS:  Please see the history of present illness.  All other ROS reviewed and negative.     Physical  Exam/Data:   Vitals:   04/21/21 1937 04/22/21 0047 04/22/21 0404 04/22/21 0746  BP: 118/85 (!) 99/57 96/60 101/62  Pulse: 66 70 62 60  Resp: _0 Temp: 98.4 F (36.9 C) 98 F (36.7 C) 97.9 F (36.6 C) 98.1 F (36.7 C)  TempSrc: Oral Oral Oral Oral  SpO2: 98% 99% 97% 96%  Weight: 61.4 kg  60.7 kg   Height: _1  (1.626 m)      No intake or output data in the 24 hours ending 04/22/21 0920 Last 3 Weights 04/22/2021 04/21/2021 04/21/2021  Weight (lbs) 133 lb 12.8 oz 135 lb 6.4 oz 135 lb  Weight (kg) 60.691 kg 61.417 kg 61.236 kg     Body mass index is 22.97 kg/m.  General:  Well nourished, well developed, in no acute distress HEENT: normal  Neck: no JVD Vascular: No carotid bruits; Distal pulses 2+ bilaterally Cardiac:  normal S1, S2; RRR; no murmur  Lungs:  clear to auscultation bilaterally, no wheezing, rhonchi or rales  Abd: soft, nontender, no hepatomegaly  Ext: no edema Musculoskeletal:  No deformities, BUE and BLE strength normal and equal Skin: warm and dry  Neuro:  CNs 2-12 intact, no focal abnormalities noted Psych:  Normal affect   EKG:  The EKG was personally reviewed and demonstrates: Sinus rhythm, minimal ST upsloping in inferior lateral leads, compared to prior EKG later leads seems new Telemetry:  Telemetry was personally reviewed and demonstrates: Sinus rhythm, intermittent elevation of rate to 90  Relevant CV Studies:  Echo 07/2018 1. The left ventricle has normal systolic function, with an ejection  fraction of 55-60%. The cavity size was normal. Left ventricular diastolic  parameters were normal.   2. The right ventricle has normal systolic function. The cavity was  normal. There is no increase in right ventricular wall thickness.   3. The mitral valve is normal in structure.   4. The tricuspid valve is normal in structure.   5. The aortic valve is normal in structure.   Laboratory Data:  High Sensitivity Troponin:   Recent Labs  Lab  04/21/21 1458 04/21/21 1726 04/21/21 2232 04/22/21 0254 04/22/21 0541  TROPONINIHS <2 <2 <2 <2 <2     Chemistry Recent Labs  Lab 04/21/21 1458  NA 141  K 3.9  CL 105  CO2 26  GLUCOSE 107*  BUN 15  CREATININE 0.65  CALCIUM 10.2  GFRNONAA >60  ANIONGAP 10    Recent Labs  Lab 04/21/21 1458  PROT 7.9  ALBUMIN 4.9  AST 21  ALT 20  ALKPHOS 72  BILITOT 0.8    Hematology Recent Labs  Lab 04/21/21 1458  WBC 5.8  RBC 4.04  HGB 12.8  HCT 38.2  MCV 94.6  MCH 31.7  MCHC 33.5  RDW 12.0  PLT 267   Radiology/Studies:  CT Angio Chest PE W and/or Wo Contrast  Result Date: 04/21/2021 CLINICAL DATA:  Chest pain, back pain and diaphoresis. EXAM: CT ANGIOGRAPHY CHEST WITH CONTRAST TECHNIQUE: Multidetector CT imaging of the chest was performed using the standard protocol during bolus administration of intravenous contrast. Multiplanar CT image reconstructions and MIPs were obtained to evaluate the vascular anatomy. CONTRAST:  88m OMNIPAQUE IOHEXOL 350 MG/ML SOLN COMPARISON:  None. FINDINGS: Cardiovascular: The heart is normal in size. No pericardial effusion. The aorta is normal in caliber. Mild tortuosity. No dissection. The branch vessels are patent. Minimal scattered atherosclerotic calcifications. The pulmonary arterial tree is well opacified. No filling defects to suggest pulmonary embolism. Mediastinum/Nodes: No mediastinal or hilar mass or lymphadenopathy. The esophagus is unremarkable. Lungs/Pleura: The lungs are clear of an acute process. No worrisome pulmonary lesions or pulmonary nodules. No pleural effusions or pleural nodules. The central tracheobronchial tree is unremarkable. Upper Abdomen: No significant upper abdominal findings. Musculoskeletal: Bilateral breast prostheses are noted. No breast masses, supraclavicular or axillary adenopathy. The thyroid gland is unremarkable. The bony thorax is intact. Review of the MIP images confirms the above findings. IMPRESSION: 1.  No CT findings for pulmonary embolism. 2. Normal thoracic aorta. 3. No acute pulmonary findings. Aortic Atherosclerosis (ICD10-I70.0). Electronically Signed   By: PMarijo SanesM.D.   On: 04/21/2021 16:11   DG Chest Portable 1 View  Result Date: 04/21/2021 CLINICAL DATA:  Chest pain EXAM: PORTABLE CHEST 1 VIEW COMPARISON:  Chest radiograph dated April 28, 2020  FINDINGS: The heart size and mediastinal contours are within normal limits. Both lungs are clear. The visualized skeletal structures are unremarkable. IMPRESSION: No active disease. Electronically Signed   By: Keane Police D.O.   On: 04/21/2021 15:13     Assessment and Plan:   Chest discomfort -She is dealing with 2-year history of back pain radiating to her chest.  This is occurring mostly at night and lasting for 1 hour or so.  This is different then she saw me in 2018.  At that time she had a low risk stress test.  Some sensation of palpitation.  EKG strip in Apple watch without tachycardia or arrhythmia.  Previously her symptoms resolved after cessation of caffeine and increase fluid intake. -Patient has been ruled out.  Troponin negative.  CT angio of the chest without pulmonary embolism -EKG with ST upsloping in inferior lateral leads on arrival.  Seems new in lateral leads.  ST segment resolved on repeat EKG yesterday. -Differential includes coronary vasospasm or arrhythmia/tachycardia.  2.  Questionable palpitation -EKG with short PR interval. -Telemetry shows sinus rhythm with heart rate in 60s however intermittently goes to 90s and one time it went to 110 for seconds to a minute.  She did not felt this during admission. -Questionable she has faster heart rate leading to above symptoms.  She will benefit from monitoring in outpatient setting. -Strongly encourage complete cessation of caffeine intake and increase fluid as recommended previously.  Keep NPO. MD to see.   Risk Assessment/Risk Scores:   HEAR Score (for  undifferentiated chest pain):  HEAR Score: 4          For questions or updates, please contact [place] Please consult [email or web address] for contact info under    Signed, Leanor Kail, PA  04/22/2021 9:20 AM   Patient seen and examined  I agree with findings as noted by B Bhagat above    Pt is a 50 yo with hx of CP   Neg myoview in the past  Also hx of gerd, tobacco/vap abuse   Presents with CP  Back radiating to the front     Patient has a hx of reflux   has difficulty telling if different  Has had worsening reflux recently  cT scan showed no PE but did show some vascular calcifications   ON exam Pt in NAD Neck:  JVP is normal LUngs are CTA Cardiac RRR  No S3  no murmurs Chest   Nontender Ext without edema  EKG as noted above did have some ST changes (upsloping)   Resolved   Occurred at higher heart rates Trop negative    Given previous neg test and pain, she should have further eval to see if has flow limiting dz  Would recomm cT coronary angiogram to see if has flow limiting CAD  If does the go to [facility]  If not then she needs to go back to [facility]) to get clarifiet  Discussed tob/vape use   Needs to quit  With scattered calcifications at the least she should be on a sstatin  Dorris Carnes MD

## 2021-04-22 NOTE — H&P (View-Only) (Signed)
Cardiology Consultation:   Patient ID: KELYSE PASK MRN: [id number]; DOB: 1971-01-01  Admit date: 04/21/2021 Date of Consult: 04/22/2021  PCP:  Fredirick Lathe, PA-C   [facility] Providers Cardiologist:  None   Will be new    Patient Profile:   [name] [name] is a 50 y.o. female with a hx of PMH of IBS, GERD and anxiety  who is being seen 04/22/2021 for the evaluation of chest pain  at the request of Dr. Wayna Chalet.  He works as a Engineer, maintenance (IT) and has underlying anxiety. She smoked tobacco for 15 years.  Currently using Vapors for past 8 years.  Seen by me February 2018 for chest pain concerning for typical and atypical features.  EKG with nonspecific ST changes in inferior leads.  Exercise Myoview 07/08/16 Nuclear stress EF: 54%. Upsloping ST segment depression ST segment depression of 1 mm was noted during stress in the V5 and V6 leads, beginning at 10 minutes of stress, and returning to baseline after less than 1 minute of recovery. Defect 1: There is a medium defect of moderate severity present in the mid anteroseptal, mid inferoseptal and apical septal location. Low risk study with a fixed mid septal defect, likely an artifact. Ischemia is not seen. Low normal LV systolic function. Cannot exclude old septal scar, but the anatomical distribution is not typical for coronary disease. LBBB was not present at baseline or during exercise.  Symptoms were completely resolved after cessation of caffeine intake and increasing fluid intake during follow-up.  As needed office visit recommended.  History of Present Illness:   [name] presented yesterday with multiple complaints.  She reports for past 2 years she is dealing with left-sided upper back pain radiating to her chest.  This is mostly occurs at night while laying down.  Symptoms last for about 1 hour or so and resolves.  She drinks 1 cup of coffee in the morning.  Still dealing with stress and anxiety.  Patient has an apple watch  and took some EKG.  On personal evaluation, sinus rhythm with heart rate in 60s.  She has a evaluation by primary care provider for ongoing symptoms.  Echocardiogram March 2020 showed normal LV function without structural abnormality.  There was a plan for monitor but never placed.  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  She suddenly felt back pain radiating to her chest.  She was clammy and had associated shortness of breath.  Symptoms persisted and came to ER for further evaluation. She was given SL nitro x 1 with improvement.   Urine drug screen positive for benzodiazepine Respiratory panel negative for influenza and COVID CT angio of chest negative for pulmonary embolism.  Normal thoracic aorta.  Aortic atherosclerosis. High-sensitivity troponin negative  Electrolytes and renal function are normal  Past Medical History:  Diagnosis Date   Anxiety    Depression    GERD (gastroesophageal reflux disease)    History of back surgery 12/18/2018   IBS (irritable bowel syndrome)     Past Surgical History:  Procedure Laterality Date   AUGMENTATION MAMMAPLASTY Bilateral 03/2017   BACK SURGERY Bilateral 12/18/2018   BILATERAL SALPINGECTOMY Bilateral 10/31/2012   Procedure: BILATERAL SALPINGECTOMY;  Surgeon: Lyman Speller, MD;  Location: [facility];  Service: Gynecology;  Laterality: Bilateral;   BREAST ENHANCEMENT SURGERY  04/12/2017   CESAREAN SECTION     CYSTO WITH HYDRODISTENSION N/A 10/31/2012   Procedure: CYSTOSCOPY/HYDRODISTENSION;  Surgeon: Reece Packer, MD;  Location: [facility];  Service: Urology;  Laterality: N/A;   DILATION AND CURETTAGE OF UTERUS     DILITATION & CURRETTAGE/HYSTROSCOPY WITH NOVASURE ABLATION N/A 10/31/2012   Procedure: DILATATION & CURETTAGE/HYSTEROSCOPY WITH NOVASURE ABLATION;  Surgeon: Lyman Speller, MD;  Location: [facility];  Service: Gynecology;  Laterality: N/A;  Dr Suzanne Boron Diarmid to start case with a cysto at beginning of case while  patient under anesthesia.   ENDOMETRIAL BIOPSY  09/19/12   neg   LAPAROSCOPY Bilateral 10/31/2012   Procedure: LAPAROSCOPY OPERATIVE;  Surgeon: Lyman Speller, MD;  Location: [facility];  Service: Gynecology;  Laterality: Bilateral;   Inpatient Medications: Scheduled Meds:  enoxaparin (LOVENOX) injection  40 mg Subcutaneous Q2000   Continuous Infusions:  PRN Meds: acetaminophen **OR** acetaminophen, clonazePAM  Allergies:    Allergies  Allergen Reactions   Ciprofloxacin Nausea Only    Social History:   Social History   Socioeconomic History   Marital status: Divorced    Spouse name: Not on file   Number of children: Not on file   Years of education: Not on file   Highest education level: Not on file  Occupational History   Not on file  Tobacco Use   Smoking status: Former    Types: Cigarettes    Quit date: 01/15/2013    Years since quitting: 8.2   Smokeless tobacco: Never  Vaping Use   Vaping Use: Former   Quit date: 07/18/2018  Substance and Sexual Activity   Alcohol use: Yes    Alcohol/week: 5.0 standard drinks    Types: 5 Standard drinks or equivalent per week   Drug use: No   Sexual activity: Yes    Partners: Male    Birth control/protection: Surgical    Comment: BSO  Other Topics Concern   Not on file  Social History Narrative   Married   1 son (2003)   Engineer, maintenance (IT)         Social Determinants of Radio broadcast assistant Strain: Not on file  Food Insecurity: Not on file  Transportation Needs: Not on file  Physical Activity: Not on file  Stress: Not on file  Social Connections: Not on file  Intimate Partner Violence: Not on file    Family History:   Family History  Problem Relation Age of Onset   Stroke Father    Non-Hodgkin's lymphoma Father    Hypertension Father    Kidney disease Father    Hypertension Mother    Club foot Son      ROS:  Please see the history of present illness.  All other ROS reviewed and negative.     Physical  Exam/Data:   Vitals:   04/21/21 1937 04/22/21 0047 04/22/21 0404 04/22/21 0746  BP: 118/85 (!) 99/57 96/60 101/62  Pulse: 66 70 62 60  Resp: _0 Temp: 98.4 F (36.9 C) 98 F (36.7 C) 97.9 F (36.6 C) 98.1 F (36.7 C)  TempSrc: Oral Oral Oral Oral  SpO2: 98% 99% 97% 96%  Weight: 61.4 kg  60.7 kg   Height: _1  (1.626 m)      No intake or output data in the 24 hours ending 04/22/21 0920 Last 3 Weights 04/22/2021 04/21/2021 04/21/2021  Weight (lbs) 133 lb 12.8 oz 135 lb 6.4 oz 135 lb  Weight (kg) 60.691 kg 61.417 kg 61.236 kg     Body mass index is 22.97 kg/m.  General:  Well nourished, well developed, in no acute distress HEENT: normal  Neck: no JVD Vascular: No carotid bruits; Distal pulses 2+ bilaterally Cardiac:  normal S1, S2; RRR; no murmur  Lungs:  clear to auscultation bilaterally, no wheezing, rhonchi or rales  Abd: soft, nontender, no hepatomegaly  Ext: no edema Musculoskeletal:  No deformities, BUE and BLE strength normal and equal Skin: warm and dry  Neuro:  CNs 2-12 intact, no focal abnormalities noted Psych:  Normal affect   EKG:  The EKG was personally reviewed and demonstrates: Sinus rhythm, minimal ST upsloping in inferior lateral leads, compared to prior EKG later leads seems new Telemetry:  Telemetry was personally reviewed and demonstrates: Sinus rhythm, intermittent elevation of rate to 90  Relevant CV Studies:  Echo 07/2018 1. The left ventricle has normal systolic function, with an ejection  fraction of 55-60%. The cavity size was normal. Left ventricular diastolic  parameters were normal.   2. The right ventricle has normal systolic function. The cavity was  normal. There is no increase in right ventricular wall thickness.   3. The mitral valve is normal in structure.   4. The tricuspid valve is normal in structure.   5. The aortic valve is normal in structure.   Laboratory Data:  High Sensitivity Troponin:   Recent Labs  Lab  04/21/21 1458 04/21/21 1726 04/21/21 2232 04/22/21 0254 04/22/21 0541  TROPONINIHS <2 <2 <2 <2 <2     Chemistry Recent Labs  Lab 04/21/21 1458  NA 141  K 3.9  CL 105  CO2 26  GLUCOSE 107*  BUN 15  CREATININE 0.65  CALCIUM 10.2  GFRNONAA >60  ANIONGAP 10    Recent Labs  Lab 04/21/21 1458  PROT 7.9  ALBUMIN 4.9  AST 21  ALT 20  ALKPHOS 72  BILITOT 0.8    Hematology Recent Labs  Lab 04/21/21 1458  WBC 5.8  RBC 4.04  HGB 12.8  HCT 38.2  MCV 94.6  MCH 31.7  MCHC 33.5  RDW 12.0  PLT 267   Radiology/Studies:  CT Angio Chest PE W and/or Wo Contrast  Result Date: 04/21/2021 CLINICAL DATA:  Chest pain, back pain and diaphoresis. EXAM: CT ANGIOGRAPHY CHEST WITH CONTRAST TECHNIQUE: Multidetector CT imaging of the chest was performed using the standard protocol during bolus administration of intravenous contrast. Multiplanar CT image reconstructions and MIPs were obtained to evaluate the vascular anatomy. CONTRAST:  88m OMNIPAQUE IOHEXOL 350 MG/ML SOLN COMPARISON:  None. FINDINGS: Cardiovascular: The heart is normal in size. No pericardial effusion. The aorta is normal in caliber. Mild tortuosity. No dissection. The branch vessels are patent. Minimal scattered atherosclerotic calcifications. The pulmonary arterial tree is well opacified. No filling defects to suggest pulmonary embolism. Mediastinum/Nodes: No mediastinal or hilar mass or lymphadenopathy. The esophagus is unremarkable. Lungs/Pleura: The lungs are clear of an acute process. No worrisome pulmonary lesions or pulmonary nodules. No pleural effusions or pleural nodules. The central tracheobronchial tree is unremarkable. Upper Abdomen: No significant upper abdominal findings. Musculoskeletal: Bilateral breast prostheses are noted. No breast masses, supraclavicular or axillary adenopathy. The thyroid gland is unremarkable. The bony thorax is intact. Review of the MIP images confirms the above findings. IMPRESSION: 1.  No CT findings for pulmonary embolism. 2. Normal thoracic aorta. 3. No acute pulmonary findings. Aortic Atherosclerosis (ICD10-I70.0). Electronically Signed   By: PMarijo SanesM.D.   On: 04/21/2021 16:11   DG Chest Portable 1 View  Result Date: 04/21/2021 CLINICAL DATA:  Chest pain EXAM: PORTABLE CHEST 1 VIEW COMPARISON:  Chest radiograph dated April 28, 2020  FINDINGS: The heart size and mediastinal contours are within normal limits. Both lungs are clear. The visualized skeletal structures are unremarkable. IMPRESSION: No active disease. Electronically Signed   By: Keane Police D.O.   On: 04/21/2021 15:13     Assessment and Plan:   Chest discomfort -She is dealing with 2-year history of back pain radiating to her chest.  This is occurring mostly at night and lasting for 1 hour or so.  This is different then she saw me in 2018.  At that time she had a low risk stress test.  Some sensation of palpitation.  EKG strip in Apple watch without tachycardia or arrhythmia.  Previously her symptoms resolved after cessation of caffeine and increase fluid intake. -Patient has been ruled out.  Troponin negative.  CT angio of the chest without pulmonary embolism -EKG with ST upsloping in inferior lateral leads on arrival.  Seems new in lateral leads.  ST segment resolved on repeat EKG yesterday. -Differential includes coronary vasospasm or arrhythmia/tachycardia.  2.  Questionable palpitation -EKG with short PR interval. -Telemetry shows sinus rhythm with heart rate in 60s however intermittently goes to 90s and one time it went to 110 for seconds to a minute.  She did not felt this during admission. -Questionable she has faster heart rate leading to above symptoms.  She will benefit from monitoring in outpatient setting. -Strongly encourage complete cessation of caffeine intake and increase fluid as recommended previously.  Keep NPO. MD to see.   Risk Assessment/Risk Scores:   HEAR Score (for  undifferentiated chest pain):  HEAR Score: 4          For questions or updates, please contact [place] Please consult [email or web address] for contact info under    Signed, Leanor Kail, PA  04/22/2021 9:20 AM   Patient seen and examined  I agree with findings as noted by B Bhagat above    Pt is a 50 yo with hx of CP   Neg myoview in the past  Also hx of gerd, tobacco/vap abuse   Presents with CP  Back radiating to the front     Patient has a hx of reflux   has difficulty telling if different  Has had worsening reflux recently  cT scan showed no PE but did show some vascular calcifications   ON exam Pt in NAD Neck:  JVP is normal LUngs are CTA Cardiac RRR  No S3  no murmurs Chest   Nontender Ext without edema  EKG as noted above did have some ST changes (upsloping)   Resolved   Occurred at higher heart rates Trop negative    Given previous neg test and pain, she should have further eval to see if has flow limiting dz  Would recomm cT coronary angiogram to see if has flow limiting CAD  If does the [facility]  If not then she needs to go back to [facility]) to get clarifiet  Discussed tob/vape use   Needs to quit  With scattered calcifications at the least she should be on a sstatin  Dorris Carnes MD

## 2021-04-27 ENCOUNTER — Telehealth: Payer: Self-pay | Admitting: *Deleted

## 2021-04-27 NOTE — Telephone Encounter (Signed)
Cardiac catheterization scheduled at [facility] for: Tuesday April 28, 2021 [facility]) at: 10 AM   No solid food after midnight prior to cath, clear liquids until 5 AM day of procedure.  Usual morning medications can be taken pre-cath with sips of water including aspirin 81 mg.    Confirmed patient has responsible adult to drive home post procedure and be with patient first 24 hours after arriving home.  [facility] does allow one visitor to accompany you and wait in the hospital waiting room while you are there for your procedure. You and your visitor will be asked to wear a mask once you enter the hospital.   Patient reports does not currently have any new symptoms concerning for COVID-19 and no household members with COVID-19 like illness.    Reviewed procedure,mask,visitor instructions with patient.        '

## 2021-04-28 ENCOUNTER — Other Ambulatory Visit: Payer: Self-pay

## 2021-04-28 ENCOUNTER — Ambulatory Visit (HOSPITAL_COMMUNITY)
Admission: RE | Disposition: A | Discharge status: Home / Self Care | Payer: Self-pay | Source: Home / Self Care | Attending: Cardiology

## 2021-04-28 ENCOUNTER — Ambulatory Visit (HOSPITAL_COMMUNITY)
Admission: RE | Admit: 2021-04-28 | Discharge: 2021-04-28 | Disposition: A | Discharge status: Home / Self Care | Payer: 59 | Attending: Cardiology | Admitting: Cardiology

## 2021-04-28 ENCOUNTER — Encounter: Payer: Self-pay | Admitting: Physician Assistant

## 2021-04-28 DIAGNOSIS — K589 Irritable bowel syndrome without diarrhea: Secondary | ICD-10-CM | POA: Insufficient documentation

## 2021-04-28 DIAGNOSIS — Z87891 Personal history of nicotine dependence: Secondary | ICD-10-CM | POA: Diagnosis present

## 2021-04-28 DIAGNOSIS — F17200 Nicotine dependence, unspecified, uncomplicated: Secondary | ICD-10-CM | POA: Diagnosis not present

## 2021-04-28 DIAGNOSIS — K219 Gastro-esophageal reflux disease without esophagitis: Secondary | ICD-10-CM | POA: Diagnosis not present

## 2021-04-28 DIAGNOSIS — F419 Anxiety disorder, unspecified: Secondary | ICD-10-CM | POA: Insufficient documentation

## 2021-04-28 DIAGNOSIS — R0789 Other chest pain: Secondary | ICD-10-CM | POA: Diagnosis present

## 2021-04-28 DIAGNOSIS — R079 Chest pain, unspecified: Secondary | ICD-10-CM | POA: Diagnosis not present

## 2021-04-28 HISTORY — PX: LEFT HEART CATH AND CORONARY ANGIOGRAPHY: CATH118249

## 2021-04-28 SURGERY — LEFT HEART CATH AND CORONARY ANGIOGRAPHY
Anesthesia: LOCAL

## 2021-04-28 MED ORDER — ONDANSETRON HCL 4 MG/2ML IJ SOLN
INTRAMUSCULAR | Status: DC | PRN
  Administered 2021-04-28: 4 mg via INTRAVENOUS

## 2021-04-28 MED ORDER — SODIUM CHLORIDE 0.9 % IV SOLN: INTRAVENOUS | Status: DC

## 2021-04-28 MED ORDER — ACETAMINOPHEN 325 MG PO TABS: 650.0000 mg | ORAL_TABLET | ORAL | Status: DC | PRN

## 2021-04-28 MED ORDER — SODIUM CHLORIDE 0.9 % IV SOLN: 250.0000 mL | INTRAVENOUS | Status: DC | PRN

## 2021-04-28 MED ORDER — FENTANYL CITRATE (PF) 100 MCG/2ML IJ SOLN
INTRAMUSCULAR | Status: DC | PRN
  Administered 2021-04-28: 25 ug via INTRAVENOUS

## 2021-04-28 MED ORDER — SODIUM CHLORIDE 0.9% FLUSH: 3.0000 mL | INTRAVENOUS | Status: DC | PRN

## 2021-04-28 MED ORDER — HEPARIN SODIUM (PORCINE) 1000 UNIT/ML IJ SOLN
INTRAMUSCULAR | Status: DC | PRN
  Administered 2021-04-28: 3000 [IU] via INTRAVENOUS

## 2021-04-28 MED ORDER — MIDAZOLAM HCL 2 MG/2ML IJ SOLN
INTRAMUSCULAR | Status: DC | PRN
  Administered 2021-04-28: 2 mg via INTRAVENOUS

## 2021-04-28 MED ORDER — ONDANSETRON HCL 4 MG/2ML IJ SOLN: 4.0000 mg | Freq: Four times a day (QID) | INTRAMUSCULAR | Status: DC | PRN

## 2021-04-28 MED ORDER — SODIUM CHLORIDE 0.9 % WEIGHT BASED INFUSION: 1.0000 mL/kg/h | INTRAVENOUS | Status: DC

## 2021-04-28 MED ORDER — ASPIRIN 81 MG PO CHEW: 81.0000 mg | CHEWABLE_TABLET | ORAL | Status: DC

## 2021-04-28 MED ORDER — SODIUM CHLORIDE 0.9% FLUSH: 3.0000 mL | Freq: Two times a day (BID) | INTRAVENOUS | Status: DC

## 2021-04-28 MED ORDER — LIDOCAINE HCL (PF) 1 % IJ SOLN
INTRAMUSCULAR | Status: DC | PRN
  Administered 2021-04-28: 2 mL

## 2021-04-28 MED ORDER — IOHEXOL 350 MG/ML SOLN
INTRAVENOUS | Status: DC | PRN
  Administered 2021-04-28: 45 mL

## 2021-04-28 MED ORDER — VERAPAMIL HCL 2.5 MG/ML IV SOLN
INTRAVENOUS | Status: DC | PRN
  Administered 2021-04-28: 10 mL via INTRA_ARTERIAL

## 2021-04-28 MED ORDER — HEPARIN (PORCINE) IN NACL 1000-0.9 UT/500ML-% IV SOLN
INTRAVENOUS | Status: DC | PRN
  Administered 2021-04-28 (×2): 500 mL

## 2021-04-28 SURGICAL SUPPLY — 9 items
CATH 5FR JL3.5 JR4 ANG PIG MP (CATHETERS) ×1 IMPLANT
DEVICE RAD TR BAND REGULAR (VASCULAR PRODUCTS) ×1 IMPLANT
GLIDESHEATH SLEND SS 6F .021 (SHEATH) ×1 IMPLANT
GUIDEWIRE INQWIRE 1.5J.035X260 (WIRE) IMPLANT
INQWIRE 1.5J .035X260CM (WIRE) ×2
KIT HEART LEFT (KITS) ×2 IMPLANT
PACK CARDIAC CATHETERIZATION (CUSTOM PROCEDURE TRAY) ×2 IMPLANT
TRANSDUCER W/STOPCOCK (MISCELLANEOUS) ×2 IMPLANT
TUBING CIL FLEX 10 FLL-RA (TUBING) ×2 IMPLANT

## 2021-04-28 NOTE — Discharge Instructions (Signed)
Radial Site Care  This sheet gives you information about how to care for yourself after your procedure. Your health care provider may also give you more specific instructions. If you have problems or questions, contact your health care provider. What can I expect after the procedure? After the procedure, it is common to have: Bruising and tenderness at the catheter insertion area. Follow these instructions at home: Medicines Take over-the-counter and prescription medicines only as told by your health care provider. Insertion site care Follow instructions from your health care provider about how to take care of your insertion site. Make sure you: Wash your hands with soap and water before you remove your bandage (dressing). If soap and water are not available, use hand sanitizer. May remove dressing in 24 hours. Check your insertion site every day for signs of infection. Check for: Redness, swelling, or pain. Fluid or blood. Pus or a bad smell. Warmth. Do no take baths, swim, or use a hot tub for 5 days. You may shower 24-48 hours after the procedure. Remove the dressing and gently wash the site with plain soap and water. Pat the area dry with a clean towel. Do not rub the site. That could cause bleeding. Do not apply powder or lotion to the site. Activity  For 24 hours after the procedure, or as directed by your health care provider: Do not flex or bend the affected arm. Do not push or pull heavy objects with the affected arm. Do not drive yourself home from the hospital or clinic. You may drive 24 hours after the procedure. Do not operate machinery or power tools. KEEP ARM ELEVATED THE REMAINDER OF THE DAY. Do not push, pull or lift anything that is heavier than 10 lb for 5 days. Ask your health care provider when it is okay to: Return to work or school. Resume usual physical activities or sports. Resume sexual activity. General instructions If the catheter site starts to  bleed, raise your arm and put firm pressure on the site. If the bleeding does not stop, get help right away. This is a medical emergency. DRINK PLENTY OF FLUIDS FOR THE NEXT 2-3 DAYS. No alcohol consumption for 24 hours after receiving sedation. If you went home on the same day as your procedure, a responsible adult should be with you for the first 24 hours after you arrive home. Keep all follow-up visits as told by your health care provider. This is important. Contact a health care provider if: You have a fever. You have redness, swelling, or yellow drainage around your insertion site. Get help right away if: You have unusual pain at the radial site. The catheter insertion area swells very fast. The insertion area is bleeding, and the bleeding does not stop when you hold steady pressure on the area. Your arm or hand becomes pale, cool, tingly, or numb. These symptoms may represent a serious problem that is an emergency. Do not wait to see if the symptoms will go away. Get medical help right away. Call your local emergency services (911 in the [place] Do not drive yourself to the hospital. Summary After the procedure, it is common to have bruising and tenderness at the site. Follow instructions from your health care provider about how to take care of your radial site wound. Check the wound every day for signs of infection.  This information is not intended to replace advice given to you by your health care provider. Make sure you discuss any questions you have with   your health care provider. Document Revised: 06/22/2017 Document Reviewed: 06/22/2017 Elsevier Patient Education  2020 Elsevier Inc.  

## 2021-04-28 NOTE — Interval H&P Note (Signed)
History and Physical Interval Note:  04/28/2021 11:49 AM  [name] [name]  has presented today for surgery, with the diagnosis of chest pain.  The various methods of treatment have been discussed with the patient and family. After consideration of risks, benefits and other options for treatment, the patient has consented to  Procedure(s): LEFT HEART CATH AND CORONARY ANGIOGRAPHY (N/A) as a surgical intervention.  The patient's history has been reviewed, patient examined, no change in status, stable for surgery.  I have reviewed the patient's chart and labs.  Questions were answered to the patient's satisfaction.     Collier Salina [place]

## 2021-04-28 NOTE — Interval H&P Note (Signed)
History and Physical Interval Note:  04/28/2021 11:51 AM  [name] [name]  has presented today for surgery, with the diagnosis of chest pain.  The various methods of treatment have been discussed with the patient and family. After consideration of risks, benefits and other options for treatment, the patient has consented to  Procedure(s): LEFT HEART CATH AND CORONARY ANGIOGRAPHY (N/A) as a surgical intervention.  The patient's history has been reviewed, patient examined, no change in status, stable for surgery.  I have reviewed the patient's chart and labs.  Questions were answered to the patient's satisfaction.   Cath Lab Visit (complete for each Cath Lab visit)  Clinical Evaluation Leading to the Procedure:   ACS: No.  Non-ACS:    Anginal Classification: CCS II  Anti-ischemic medical therapy: No Therapy  Non-Invasive Test Results: No non-invasive testing performed  Prior CABG: No previous CABG        [name] 04/28/2021 11:51 AM

## 2021-04-28 NOTE — Progress Notes (Signed)
Discharge instructions reviewed with pt and her boyfriend both voice understanding.

## 2021-04-29 ENCOUNTER — Encounter (HOSPITAL_COMMUNITY): Payer: Self-pay | Admitting: Cardiology

## 2021-04-29 MED FILL — Verapamil HCl IV Soln 2.5 MG/ML: INTRAVENOUS | Qty: 2 | Status: AC

## 2021-05-05 ENCOUNTER — Other Ambulatory Visit: Payer: Self-pay

## 2021-05-05 ENCOUNTER — Ambulatory Visit: Payer: 59 | Admitting: Physician Assistant

## 2021-05-05 VITALS — BP 110/74 | HR 75 | Temp 98.0°F | Ht 61.0 in | Wt 138.5 lb

## 2021-05-05 DIAGNOSIS — K219 Gastro-esophageal reflux disease without esophagitis: Secondary | ICD-10-CM

## 2021-05-05 DIAGNOSIS — R1011 Right upper quadrant pain: Secondary | ICD-10-CM | POA: Diagnosis not present

## 2021-05-05 DIAGNOSIS — R1013 Epigastric pain: Secondary | ICD-10-CM

## 2021-05-05 DIAGNOSIS — F411 Generalized anxiety disorder: Secondary | ICD-10-CM | POA: Diagnosis not present

## 2021-05-05 MED ORDER — CLONAZEPAM 0.5 MG PO TABS
ORAL_TABLET | ORAL | 2 refills | Status: DC
Start: 2021-05-05 — End: 2021-08-25

## 2021-05-05 NOTE — Progress Notes (Signed)
Subjective:    Patient ID: [name] [name], female    DOB: 24-Jan-1971, 50 y.o.   MRN: [id number]  Chief Complaint  Patient presents with   Abdominal Pain    Gallbladder    HPI Patient is in today for possible GB issues.  She was recently in [facility] ED for what she thought was acute MI.  Cardiac cath clear on 04/28/21. She was told to look into possible GI etiology. She sees Dr. Collene Mares, but wanted to started with [place] to try to get work-up done sooner.  Mom & dad had hx of gallbladder removal in their 59s.   She has been having intermittent epigastric pain. Worse around food. Some nausea as well. No vomiting. No blood in stool. Bowel movements are varied, constipation and loose.   Famotidine 40 mg twice daily has been helping.   She has been taking Klonopin 0.5 mg one to two times daily and this is helping her anxiety surrounding the situation, needing refill today.    Past Medical History:  Diagnosis Date   Anxiety    Depression    GERD (gastroesophageal reflux disease)    History of back surgery 12/18/2018   IBS (irritable bowel syndrome)     Past Surgical History:  Procedure Laterality Date   AUGMENTATION MAMMAPLASTY Bilateral 03/2017   BACK SURGERY Bilateral 12/18/2018   BILATERAL SALPINGECTOMY Bilateral 10/31/2012   Procedure: BILATERAL SALPINGECTOMY;  Surgeon: Lyman Speller, MD;  Location: [facility];  Service: Gynecology;  Laterality: Bilateral;   BREAST ENHANCEMENT SURGERY  04/12/2017   CESAREAN SECTION     CYSTO WITH HYDRODISTENSION N/A 10/31/2012   Procedure: CYSTOSCOPY/HYDRODISTENSION;  Surgeon: Reece Packer, MD;  Location: [facility];  Service: Urology;  Laterality: N/A;   DILATION AND CURETTAGE OF UTERUS     DILITATION & CURRETTAGE/HYSTROSCOPY WITH NOVASURE ABLATION N/A 10/31/2012   Procedure: DILATATION & CURETTAGE/HYSTEROSCOPY WITH NOVASURE ABLATION;  Surgeon: Lyman Speller, MD;  Location: [facility];  Service: Gynecology;  Laterality: N/A;  Dr Suzanne Boron Diarmid  to start case with a cysto at beginning of case while patient under anesthesia.   ENDOMETRIAL BIOPSY  09/19/12   neg   LAPAROSCOPY Bilateral 10/31/2012   Procedure: LAPAROSCOPY OPERATIVE;  Surgeon: Lyman Speller, MD;  Location: [facility];  Service: Gynecology;  Laterality: Bilateral;   LEFT HEART CATH AND CORONARY ANGIOGRAPHY N/A 04/28/2021   Procedure: LEFT HEART CATH AND CORONARY ANGIOGRAPHY;  Surgeon: Martinique, Peter M, MD;  Location: [facility];  Service: Cardiovascular;  Laterality: N/A;    Family History  Problem Relation Age of Onset   Stroke Father    Non-Hodgkin's lymphoma Father    Hypertension Father    Kidney disease Father    Hypertension Mother    Club foot Son     Social History   Tobacco Use   Smoking status: Former    Types: Cigarettes    Quit date: 01/15/2013    Years since quitting: 8.3   Smokeless tobacco: Never  Vaping Use   Vaping Use: Former   Quit date: 07/18/2018  Substance Use Topics   Alcohol use: Yes    Alcohol/week: 5.0 standard drinks    Types: 5 Standard drinks or equivalent per week   Drug use: No     Allergies  Allergen Reactions   Ciprofloxacin Nausea Only    Review of Systems NEGATIVE UNLESS OTHERWISE INDICATED IN HPI      Objective:     BP 110/74  Pulse 75   Temp 98 F (36.7 C)   Ht _0  (1.549 m)   Wt 138 lb 8 oz (62.8 kg)   LMP  (LMP Unknown)   SpO2 98%   BMI 26.17 kg/m   Wt Readings from Last 3 Encounters:  05/05/21 138 lb 8 oz (62.8 kg)  04/28/21 135 lb (61.2 kg)  04/22/21 133 lb 12.8 oz (60.7 kg)    BP Readings from Last 3 Encounters:  05/05/21 110/74  04/28/21 101/64  04/22/21 122/76     Physical Exam Vitals and nursing note reviewed.  Constitutional:      Appearance: Normal appearance. She is normal weight. She is not toxic-appearing.  HENT:     Head: Normocephalic and atraumatic.     Right Ear: Tympanic membrane, ear canal and external ear normal.     Left Ear: Tympanic membrane, ear  canal and external ear normal.     Nose: Nose normal.     Mouth/Throat:     Mouth: Mucous membranes are moist.  Eyes:     Extraocular Movements: Extraocular movements intact.     Conjunctiva/sclera: Conjunctivae normal.     Pupils: Pupils are equal, round, and reactive to light.  Cardiovascular:     Rate and Rhythm: Normal rate and regular rhythm.     Pulses: Normal pulses.     Heart sounds: Normal heart sounds.  Pulmonary:     Effort: Pulmonary effort is normal.     Breath sounds: Normal breath sounds.  Abdominal:     General: Abdomen is flat. Bowel sounds are normal.     Palpations: Abdomen is soft.     Tenderness: There is abdominal tenderness in the right upper quadrant, epigastric area and left upper quadrant. There is no right CVA tenderness, left CVA tenderness or guarding. Negative signs include Murphy's sign, McBurney's sign and psoas sign.  Musculoskeletal:        General: Normal range of motion.     Cervical back: Normal range of motion and neck supple.  Skin:    General: Skin is warm and dry.  Neurological:     General: No focal deficit present.     Mental Status: She is alert and oriented to person, place, and time.  Psychiatric:        Mood and Affect: Mood normal.        Behavior: Behavior normal.        Thought Content: Thought content normal.        Judgment: Judgment normal.       Assessment & Plan:   Problem List Items Addressed This Visit       Digestive   GERD (gastroesophageal reflux disease)   Relevant Orders   US Abdomen Complete   H. pylori breath test     Other   GAD (generalized anxiety disorder)   Other Visit Diagnoses     Epigastric pain    -  Primary   Relevant Orders   US Abdomen Complete   H. pylori breath test   Right upper quadrant abdominal pain       Relevant Orders   US Abdomen Complete   H. pylori breath test        Meds ordered this encounter  Medications   clonazePAM (KLONOPIN) 0.5 MG tablet    Sig: TAKE 1  TABLET BY MOUTH TWICE DAILY AS NEEDED FOR ANXIETY    Dispense:  60 tablet    Refill:  2   1. Epigastric pain  2. Right upper quadrant abdominal pain 3. Gastroesophageal reflux disease without esophagitis -Personally reviewed her ED reports and labs from November. -Agree that this could be GB issues. Will plan for [place].  -Low threshold for ED if acutely worse / change in symptoms. -Plan to cont Famotidine twice daily for now.  -Also check H. Pylori breath test today.  4. GAD (generalized anxiety disorder) PDMP reviewed today, no red flags, filling appropriately.  Refilled Klonopin for her today.    Gracin Soohoo M Laylani Pudwill, PA-C

## 2021-05-05 NOTE — Patient Instructions (Signed)
Good to see you today! I have placed an order for abdominal US. Please have H. Pylori breath test done today. Continue on famotidine twice daily.  Refilled Klonopin to use as needed for anxiety as well.

## 2021-05-06 LAB — H. PYLORI BREATH TEST: H. pylori Breath Test: NOT DETECTED

## 2021-05-08 ENCOUNTER — Ambulatory Visit
Admission: RE | Admit: 2021-05-08 | Discharge: 2021-05-08 | Disposition: A | Discharge status: Home / Self Care | Payer: 59 | Source: Ambulatory Visit | Attending: Physician Assistant | Admitting: Physician Assistant

## 2021-05-08 DIAGNOSIS — R1013 Epigastric pain: Secondary | ICD-10-CM

## 2021-05-08 DIAGNOSIS — K219 Gastro-esophageal reflux disease without esophagitis: Secondary | ICD-10-CM

## 2021-05-08 DIAGNOSIS — R1011 Right upper quadrant pain: Secondary | ICD-10-CM

## 2021-05-08 IMAGING — US US ABDOMEN COMPLETE
2 series · 14 of 25 positions shown · non-contrast
Comparison: Ultrasound abdomen [DATE].

CLINICAL DATA: Right upper quadrant and epigastric pain.

EXAM:
ABDOMEN ULTRASOUND COMPLETE

[Series 1: us abdomen complete · 0.17mm/px · 13 of 91 slices shown (1 of 2)]
[im 1/91]
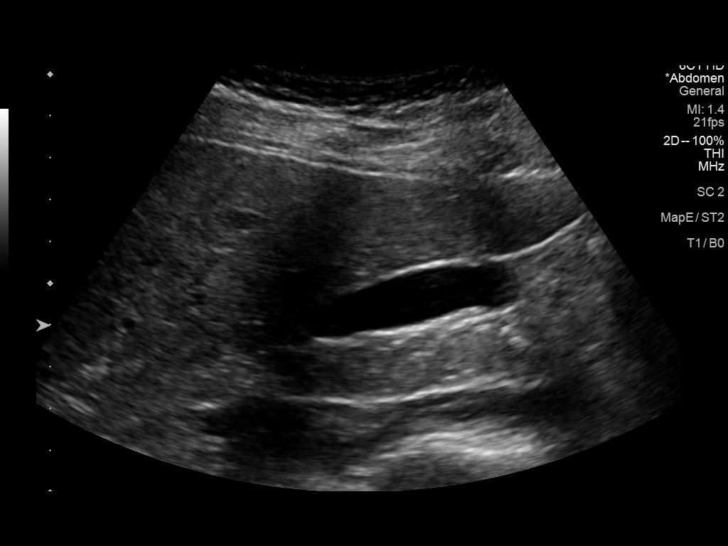
[im 9/91]
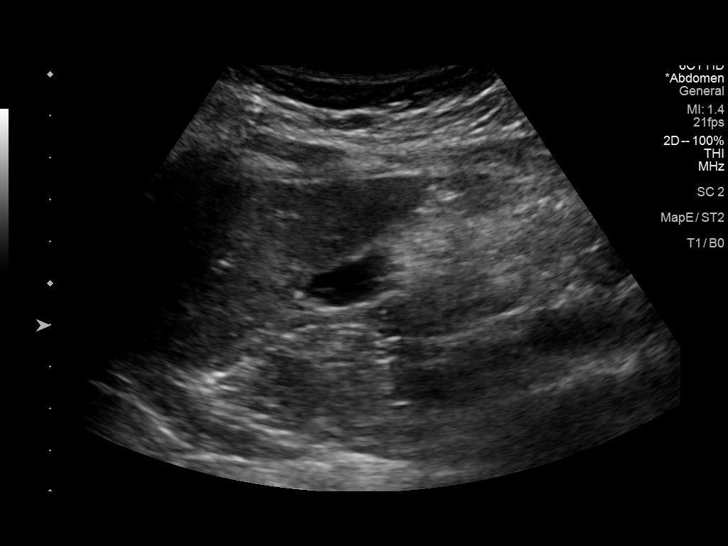
[im 17/91]
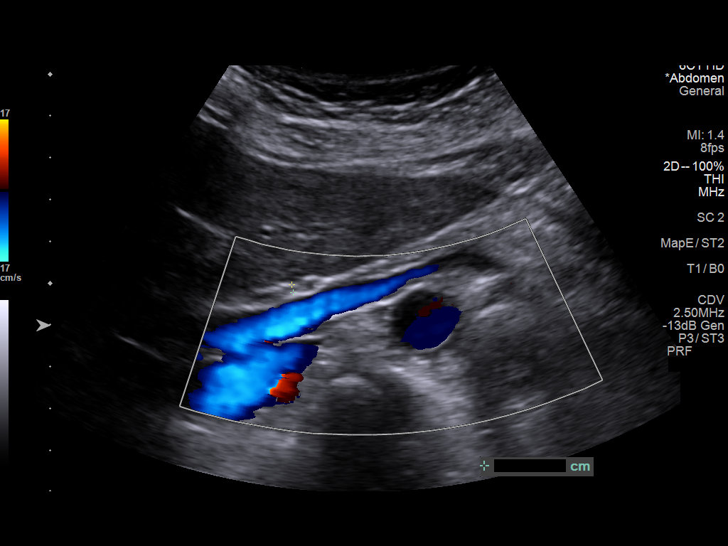
[im 25/91]
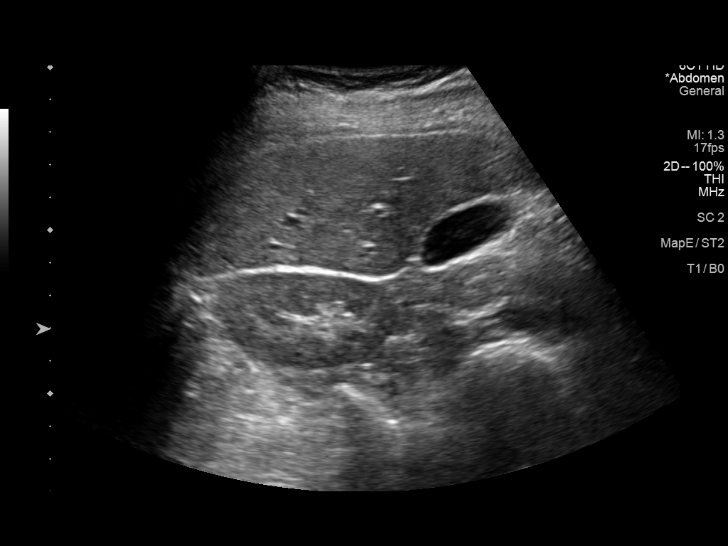
[im 33/91]
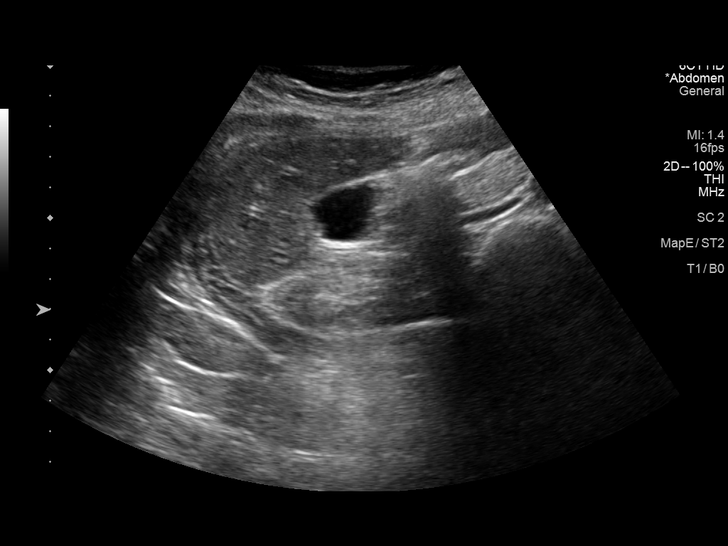
[im 37/91]
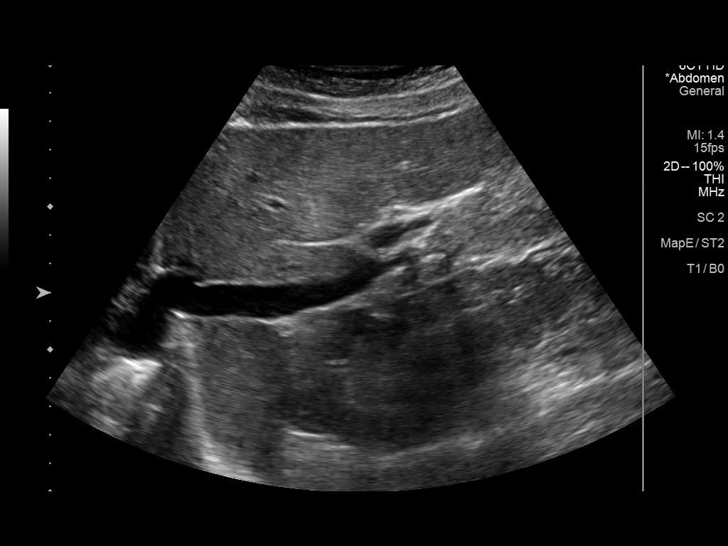
[im 46/91]
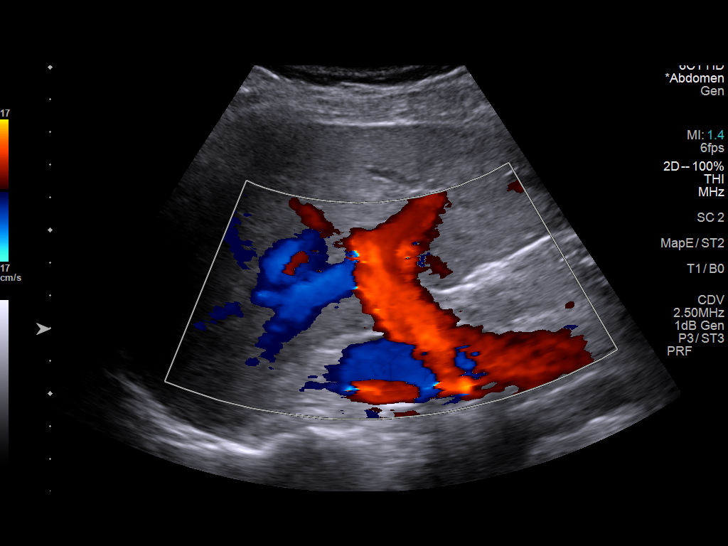
[im 54/91]
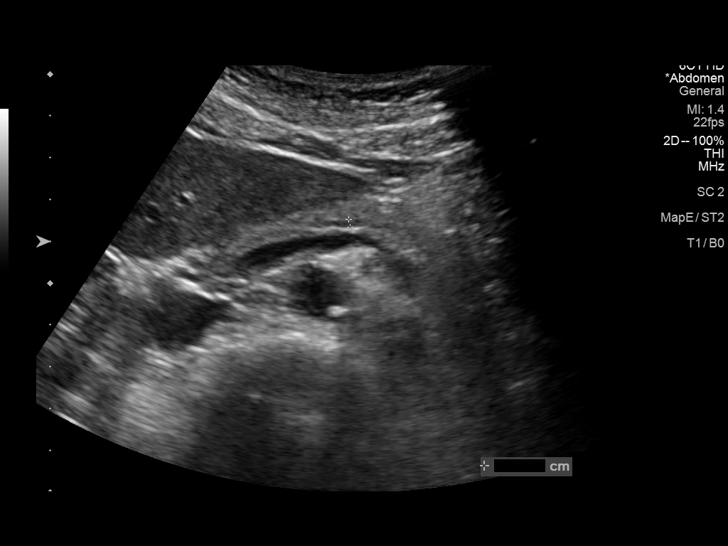
[im 62/91]
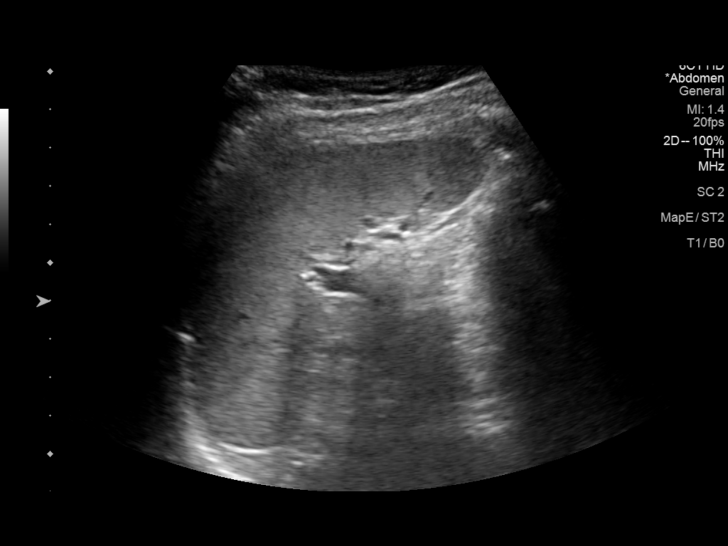
[im 66/91]
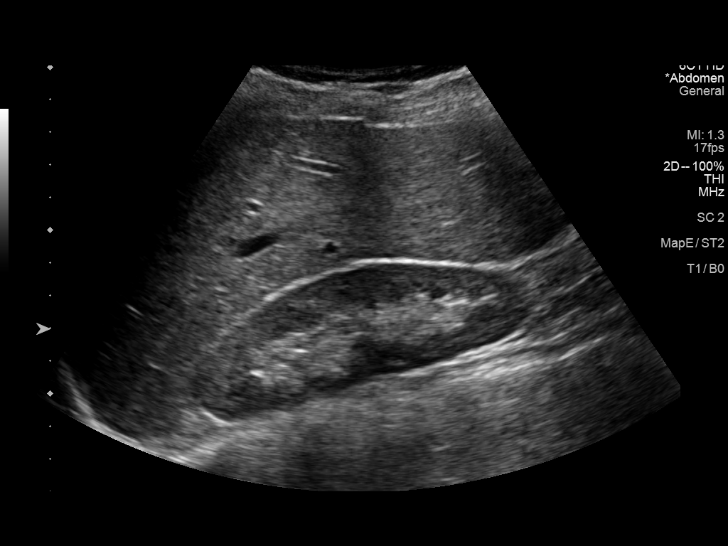
[im 74/91]
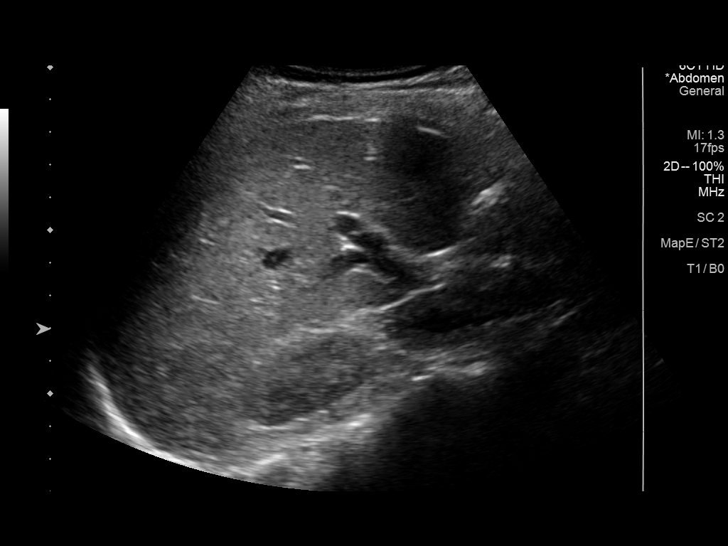
[im 82/91]
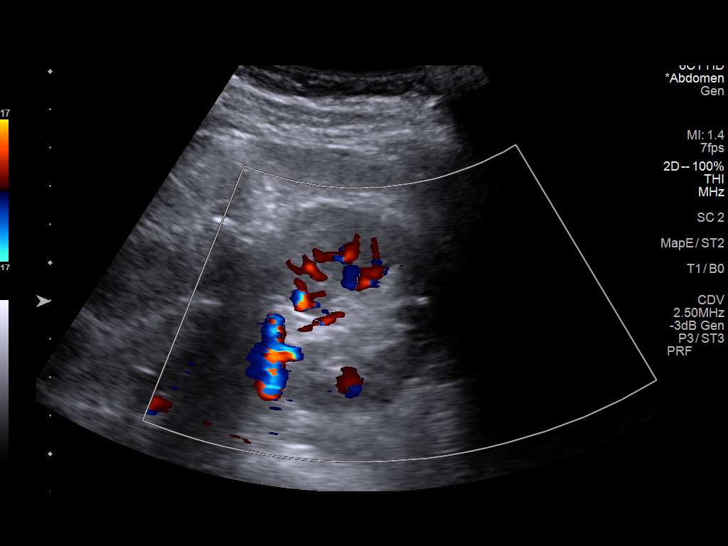
[im 91/91]
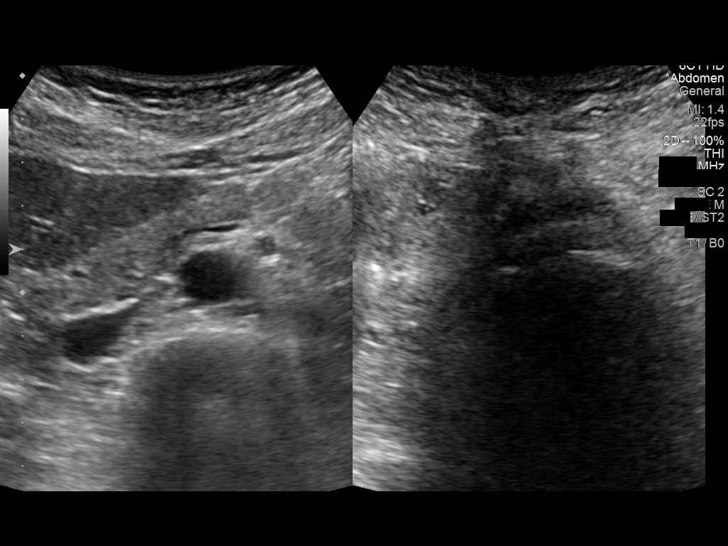

[Series 3: us abdomen complete · 0.17mm/px · 1 of 8 slices shown (2 of 2)]
[im 8/8]
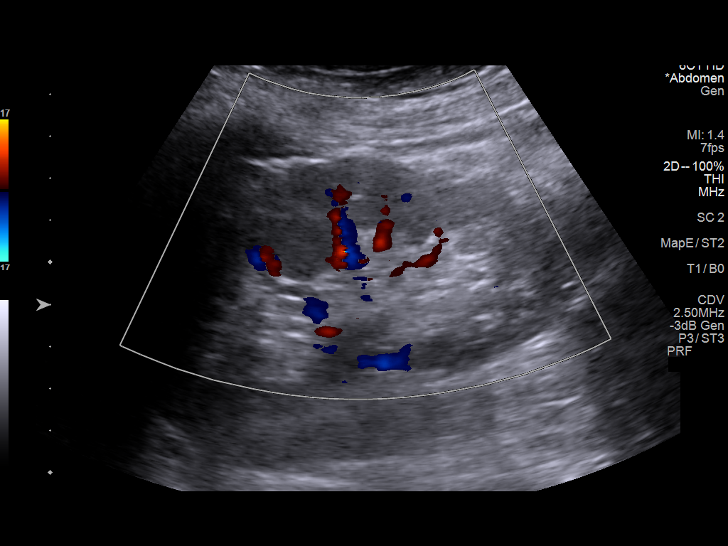

[14 of 25 positions shown; findings below may reference images not displayed]

FINDINGS: Gallbladder: No gallstones or wall thickening visualized. No
sonographic Murphy sign noted by sonographer.

Common bile duct: Diameter: 1.9 mm

Liver: No focal lesion identified. Within normal limits in
parenchymal echogenicity. Portal vein is patent on color Doppler
imaging with normal direction of blood flow towards the liver.

IVC: No abnormality visualized.

Pancreas: Visualized portion unremarkable.

Spleen: Size and appearance within normal limits.

Right Kidney: Length: 11 cm. Echogenicity within normal limits. No
mass or hydronephrosis visualized.

Left Kidney: Length: 10.5 cm. Echogenicity within normal limits. No
mass or hydronephrosis visualized.

Abdominal aorta: No aneurysm visualized.  2.2 cm.

Other findings: None.
IMPRESSION: Normal abdominal ultrasound

## 2021-05-11 ENCOUNTER — Other Ambulatory Visit: Payer: 59

## 2021-05-18 ENCOUNTER — Telehealth: Payer: Self-pay | Admitting: Cardiology

## 2021-05-18 ENCOUNTER — Ambulatory Visit: Payer: 59 | Admitting: Cardiology

## 2021-05-18 ENCOUNTER — Other Ambulatory Visit: Payer: Self-pay

## 2021-05-18 ENCOUNTER — Encounter: Payer: Self-pay | Admitting: Cardiology

## 2021-05-18 VITALS — BP 106/73 | HR 75 | Ht 63.75 in | Wt 140.2 lb

## 2021-05-18 DIAGNOSIS — M79601 Pain in right arm: Secondary | ICD-10-CM | POA: Diagnosis not present

## 2021-05-18 NOTE — Patient Instructions (Addendum)
Medication Instructions:  Continue same medications Take anti inflammatory medication ( Advil ) every day with food for the next 1 week then take as needed.   Lab Work: None ordered   Testing/Procedures: Right upper ext arterial doppler   Follow-Up: At [facility], you and your health needs are our priority.  As part of our continuing mission to provide you with exceptional heart care, we have created designated Provider Care Teams.  These Care Teams include your primary Cardiologist (physician) and Advanced Practice Providers (APPs -  Physician Assistants and Nurse Practitioners) who all work together to provide you with the care you need, when you need it.  We recommend signing up for the patient portal called "MyChart".  Sign up information is provided on this After Visit Summary.  MyChart is used to connect with patients for Virtual Visits (Telemedicine).  Patients are able to view lab/test results, encounter notes, upcoming appointments, etc.  Non-urgent messages can be sent to your provider as well.   To learn more about what you can do with MyChart, go to [email or web address].     Your next appointment:  Keep appointment already scheduled 06/18/21 at 10:00 am    The format for your next appointment: Office   Provider: Dr.Ross's PA

## 2021-05-18 NOTE — Telephone Encounter (Signed)
right arm is hurting from wrist to under armpit after heart cath.. tried ice and heat and it is so sore she can barely touch it.. this has been going on for four days.. please advise.

## 2021-05-18 NOTE — Telephone Encounter (Signed)
Spoke with pt regarding soreness and pain that goes from her wrist all the way up to her armpit. Pt states that there is no bruising or swelling but pt states that she can barely move her arm without pain. Pt has tried ice and heat, she states that ice helps a little more. Pt states she can almost trace the line from her heart cath. Pt has left heart cath on 11/29. Explained to pt that I would send her message to Dr. Martinique to advise.

## 2021-05-18 NOTE — Progress Notes (Signed)
Cardiology Office Note   Date:  05/18/2021   ID:  [name] [name], DOB 1970-12-12, MRN [id number]  PCP:  Fredirick Lathe, PA-C  Cardiologist:   Dorris Carnes MD  Chief Complaint  Patient presents with   Arm Pain      History of Present Illness: [name] [name] is a 50 y.o. female who presents for evaluation of painful right arm post cardiac cath. She was referred for cardiac cath by Dr Harrington Challenger and this was performed on 04/28/21 via a right radial approach. Cardiac cath was normal. She also had a normal Echo. Since her heart cath she has complained of pain from the wrist to the right axilla. No bruising. Painful to touch especially at the elbow. No swelling. Has tried heat and ice with limited improvement with the ice.     Past Medical History:  Diagnosis Date   Anxiety    Depression    GERD (gastroesophageal reflux disease)    History of back surgery 12/18/2018   IBS (irritable bowel syndrome)     Past Surgical History:  Procedure Laterality Date   AUGMENTATION MAMMAPLASTY Bilateral 03/2017   BACK SURGERY Bilateral 12/18/2018   BILATERAL SALPINGECTOMY Bilateral 10/31/2012   Procedure: BILATERAL SALPINGECTOMY;  Surgeon: Lyman Speller, MD;  Location: [facility];  Service: Gynecology;  Laterality: Bilateral;   BREAST ENHANCEMENT SURGERY  04/12/2017   CESAREAN SECTION     CYSTO WITH HYDRODISTENSION N/A 10/31/2012   Procedure: CYSTOSCOPY/HYDRODISTENSION;  Surgeon: Reece Packer, MD;  Location: [facility];  Service: Urology;  Laterality: N/A;   DILATION AND CURETTAGE OF UTERUS     DILITATION & CURRETTAGE/HYSTROSCOPY WITH NOVASURE ABLATION N/A 10/31/2012   Procedure: DILATATION & CURETTAGE/HYSTEROSCOPY WITH NOVASURE ABLATION;  Surgeon: Lyman Speller, MD;  Location: [facility];  Service: Gynecology;  Laterality: N/A;  Dr Suzanne Boron Diarmid to start case with a cysto at beginning of case while patient under anesthesia.   ENDOMETRIAL BIOPSY  09/19/12   neg   LAPAROSCOPY Bilateral 10/31/2012    Procedure: LAPAROSCOPY OPERATIVE;  Surgeon: Lyman Speller, MD;  Location: [facility];  Service: Gynecology;  Laterality: Bilateral;   LEFT HEART CATH AND CORONARY ANGIOGRAPHY N/A 04/28/2021   Procedure: LEFT HEART CATH AND CORONARY ANGIOGRAPHY;  Surgeon: Martinique, Mansoor Hillyard M, MD;  Location: [facility];  Service: Cardiovascular;  Laterality: N/A;     Current Outpatient Medications  Medication Sig Dispense Refill   acetaminophen (TYLENOL) 325 MG tablet Take 650 mg by mouth every 6 (six) hours as needed for mild pain.     aspirin EC 81 MG EC tablet Take 1 tablet (81 mg total) by mouth daily. Swallow whole. 360 tablet 0   clonazePAM (KLONOPIN) 0.5 MG tablet TAKE 1 TABLET BY MOUTH TWICE DAILY AS NEEDED FOR ANXIETY 60 tablet 2   estradiol (VIVELLE-DOT) 0.05 MG/24HR patch 1 patch 2 (two) times a week.     famotidine (PEPCID) 20 MG tablet Take 20 mg by mouth 2 (two) times daily.     Ibuprofen-Acetaminophen (ADVIL DUAL ACTION PO) Take 1 tablet by mouth as needed.     progesterone (PROMETRIUM) 100 MG capsule Take 100 mg by mouth at bedtime.     rosuvastatin (CRESTOR) 5 MG tablet Take 1 tablet (5 mg total) by mouth daily. 90 tablet 0   VITAMIN D PO Take by mouth daily in the afternoon.     clobetasol ointment (TEMOVATE) 1.65 % Apply 1 application topically 2 (two) times daily. (Patient not taking:  Reported on 05/18/2021) 30 g 1   nitroGLYCERIN (NITROSTAT) 0.4 MG SL tablet Place 1 tablet (0.4 mg total) under the tongue every 5 (five) minutes as needed for chest pain. (Patient not taking: Reported on 05/18/2021) 20 tablet 0   No current facility-administered medications for this visit.    Allergies:   Ciprofloxacin    Social History:  The patient  reports that she quit smoking about 8 years ago. Her smoking use included cigarettes. She has never used smokeless tobacco. She reports current alcohol use of about 5.0 standard drinks per week. She reports current drug use. Drug: Barbituates.    Family History:  The patient's family history includes Club foot in her son; Hypertension in her father and mother; Kidney disease in her father; Non-Hodgkin's lymphoma in her father; Stroke in her father.    ROS:  Please see the history of present illness.   Otherwise, review of systems are positive for none.   All other systems are reviewed and negative.    PHYSICAL EXAM: VS:  BP 106/73 (BP Location: Left Arm, Patient Position: Sitting, Cuff Size: Normal)    Pulse 75    Ht 5' 3.75" (1.619 m)    Wt 140 lb 3.2 oz (63.6 kg)    LMP  (LMP Unknown)    SpO2 98%    BMI 24.25 kg/m  , BMI Body mass index is 24.25 kg/m. GEN: Well nourished, well developed, in no acute distress HEENT: normal Neck: no JVD, carotid bruits, or masses Cardiac: RRR; no murmurs, rubs, or gallops,no edema  Respiratory:  clear to auscultation bilaterally, normal work of breathing GI: soft, nontender, nondistended, + BS The right arm is examined. She has palpable radial and ulnar pulses. No swelling or bruising. There is tenderness in the antecubital fossa to palpation. This radiates up the arm to the mid humeral area.    EKG:  EKG is not ordered today. The ekg ordered today demonstrates N/A   Recent Labs: 04/21/2021: ALT 20; BUN 15; Creatinine, Ser 0.65; Hemoglobin 12.8; Platelets 267; Potassium 3.9; Sodium 141    Lipid Panel    Component Value Date/Time   CHOL 174 04/22/2021 0541   CHOL 173 05/30/2017 0904   TRIG 56 04/22/2021 0541   HDL 67 04/22/2021 0541   HDL 75 05/30/2017 0904   CHOLHDL 2.6 04/22/2021 0541   VLDL 11 04/22/2021 0541   LDLCALC 96 04/22/2021 0541   LDLCALC 89 05/30/2017 0904      Wt Readings from Last 3 Encounters:  05/18/21 140 lb 3.2 oz (63.6 kg)  05/05/21 138 lb 8 oz (62.8 kg)  04/28/21 135 lb (61.2 kg)      Other studies Reviewed: Additional studies/ records that were reviewed today include:  Echo 04/22/21: IMPRESSIONS     1. Left ventricular ejection fraction, by  estimation, is 55 to 60%. Left  ventricular ejection fraction by 3D volume is 59 %. The left ventricle has  normal function. The left ventricle has no regional wall motion  abnormalities. There is mild left  ventricular hypertrophy. Left ventricular diastolic parameters were  normal.   2. Right ventricular systolic function is normal. The right ventricular  size is normal. There is normal pulmonary artery systolic pressure.   3. The mitral valve is normal in structure. Trivial mitral valve  regurgitation.   4. The aortic valve was not well visualized. Aortic valve regurgitation  is not visualized. No aortic stenosis is present.   5. The inferior vena cava is normal  in size with greater than 50%  respiratory variability, suggesting right atrial pressure of 3 mmHg.   Cardiac cath 04/28/21:  LEFT HEART CATH AND CORONARY ANGIOGRAPHY   Conclusion      The left ventricular systolic function is normal.   LV end diastolic pressure is normal.   The left ventricular ejection fraction is 55-65% by visual estimate.   Normal coronary anatomy Normal LV function Normal LVEDP   Plan: risk factor modification  ASSESSMENT AND PLAN:  1.  Right arm pain post cardiac cath. This is unusual since it started > 1 week post cath and there is no swelling or bruising. Radial pulse is palpable. Recommend round the clock anti-inflammatories with NSAIDs. Will arrange for upper extremity arterial doppler.   Current medicines are reviewed at length with the patient today.  The patient does not have concerns regarding medicines.  The following changes have been made:  no change         Disposition:   FU TBD  Signed, Keyandre Pileggi Martinique, MD  05/18/2021 4:19 PM    [facility] [address], [place], [place], [address] Phone [phone], Fax [phone]

## 2021-05-18 NOTE — Telephone Encounter (Signed)
Spoke to patient appointment scheduled with Dr.Jordan this afternoon at 3:40 pm.

## 2021-05-21 ENCOUNTER — Ambulatory Visit (HOSPITAL_COMMUNITY)
Admission: RE | Admit: 2021-05-21 | Discharge: 2021-05-21 | Disposition: A | Discharge status: Home / Self Care | Payer: 59 | Source: Ambulatory Visit | Attending: Cardiovascular Disease | Admitting: Cardiovascular Disease

## 2021-05-21 ENCOUNTER — Other Ambulatory Visit: Payer: Self-pay

## 2021-05-21 DIAGNOSIS — M79601 Pain in right arm: Secondary | ICD-10-CM

## 2021-05-22 ENCOUNTER — Ambulatory Visit (HOSPITAL_COMMUNITY): Payer: 59

## 2021-06-18 ENCOUNTER — Ambulatory Visit: Payer: 59 | Admitting: Physician Assistant

## 2021-06-18 ENCOUNTER — Other Ambulatory Visit: Payer: Self-pay | Admitting: Physician Assistant

## 2021-06-18 DIAGNOSIS — Z1231 Encounter for screening mammogram for malignant neoplasm of breast: Secondary | ICD-10-CM

## 2021-07-02 ENCOUNTER — Other Ambulatory Visit: Payer: Self-pay | Admitting: Physician Assistant

## 2021-07-02 ENCOUNTER — Ambulatory Visit
Admission: RE | Admit: 2021-07-02 | Discharge: 2021-07-02 | Disposition: A | Discharge status: Home / Self Care | Payer: 59 | Source: Ambulatory Visit

## 2021-07-02 DIAGNOSIS — Z1231 Encounter for screening mammogram for malignant neoplasm of breast: Secondary | ICD-10-CM

## 2021-07-02 IMAGING — MG DIGITAL SCREENING BREAST BILAT IMPLANT W/ TOMO W/ CAD
9 of 12 series · 9 of 28 positions shown · non-contrast
Comparison: Previous exam(s).

CLINICAL DATA: Screening.   History of cysts.

EXAM:
DIGITAL SCREENING BILATERAL MAMMOGRAM WITH IMPLANTS, CAD AND
TOMOSYNTHESIS
TECHNIQUE: Bilateral screening digital craniocaudal and mediolateral oblique
mammograms were obtained. Bilateral screening digital breast
tomosynthesis was performed. The images were evaluated with
computer-aided detection. Standard and/or implant displaced views
were performed.

[R MLO]
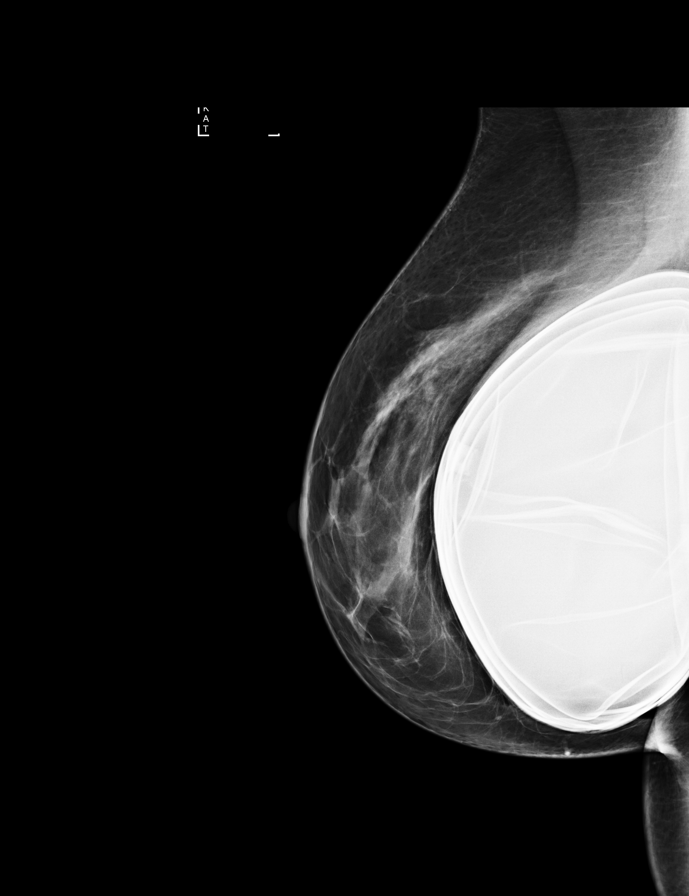

[L CC]
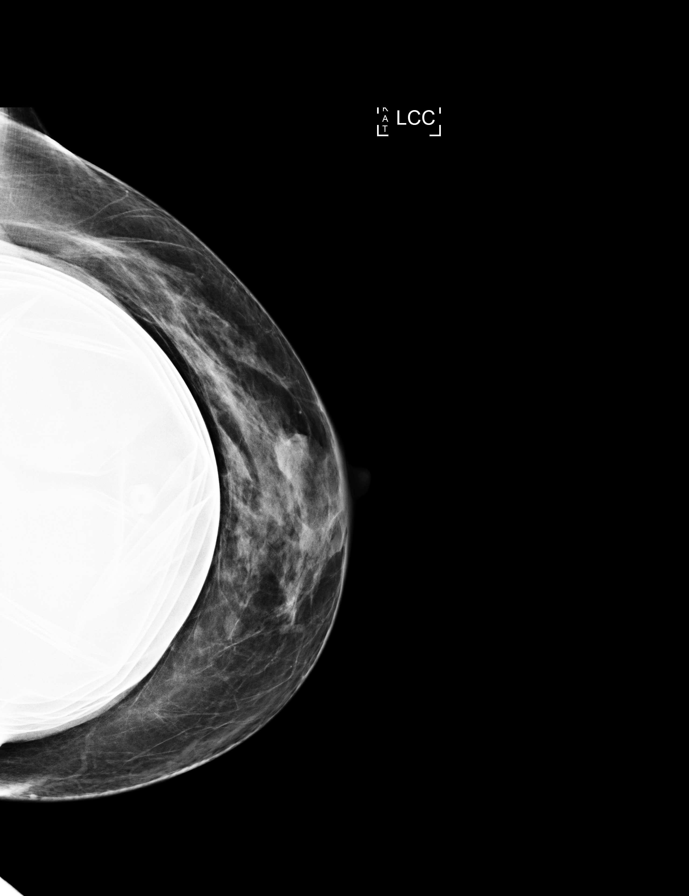

[L MLO]
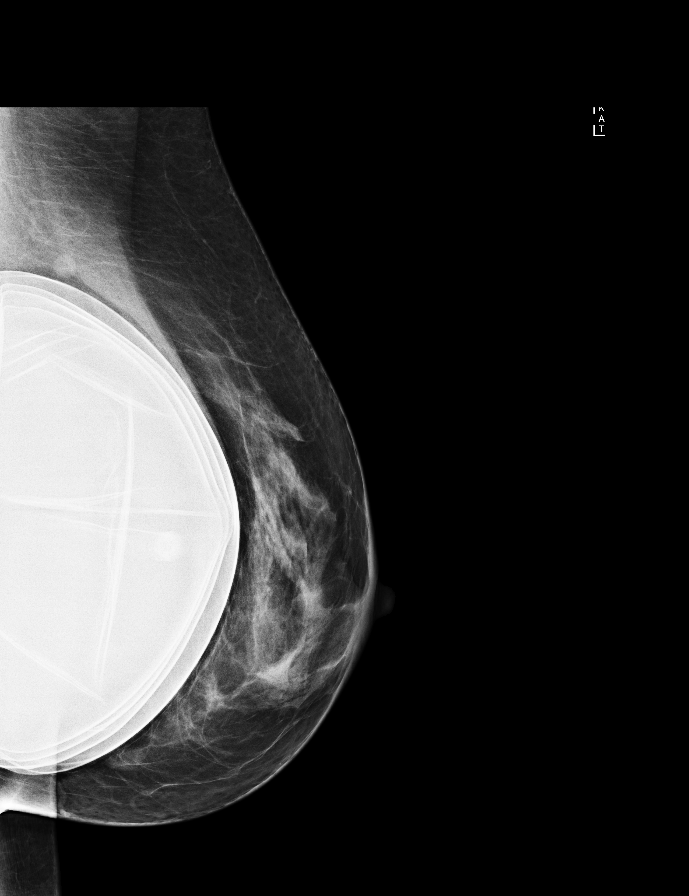

[R CC]
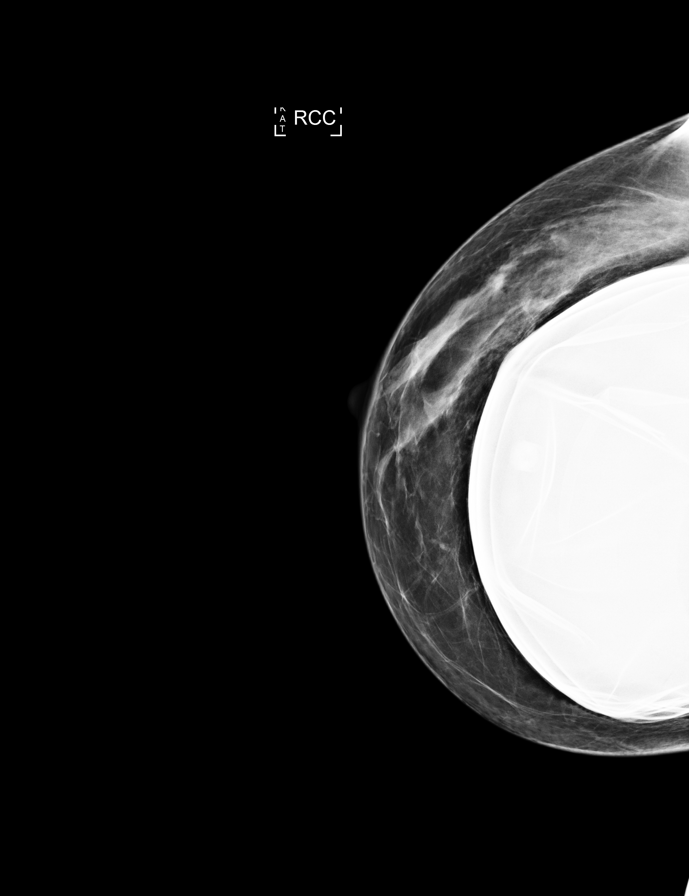

[R MLO synth-2D]
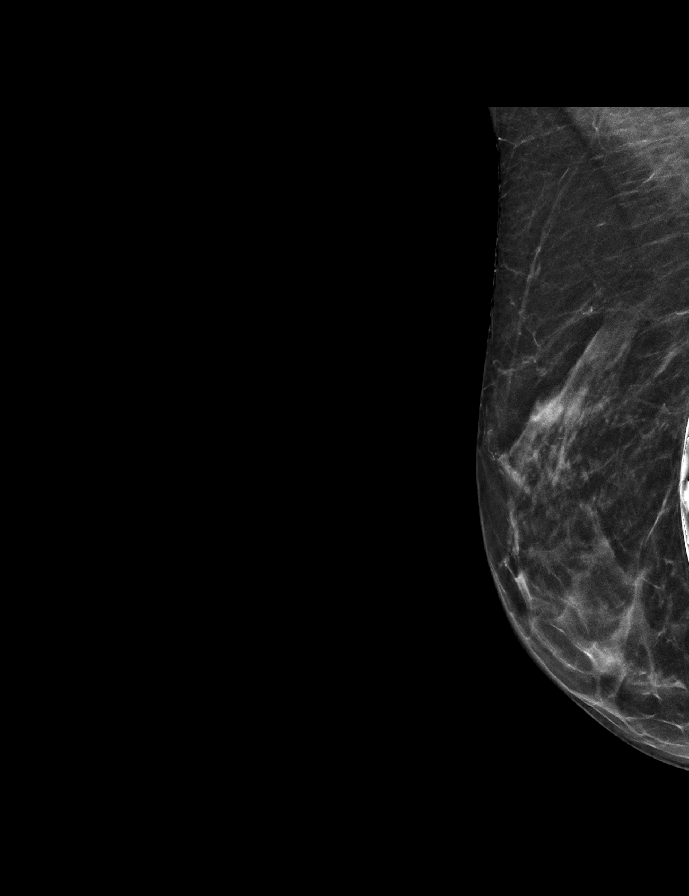

[L CC synth-2D]
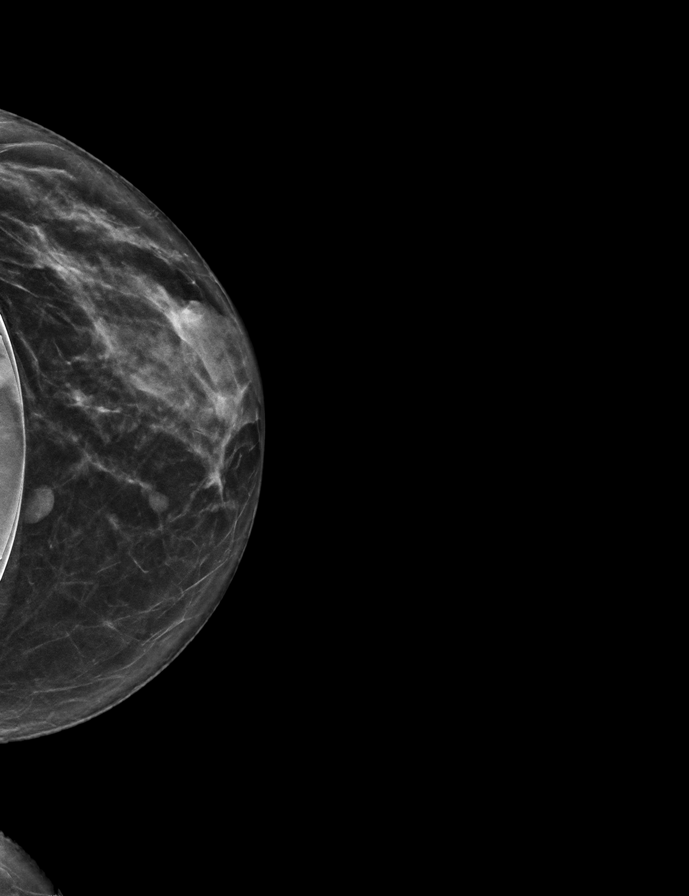

[R CC synth-2D]
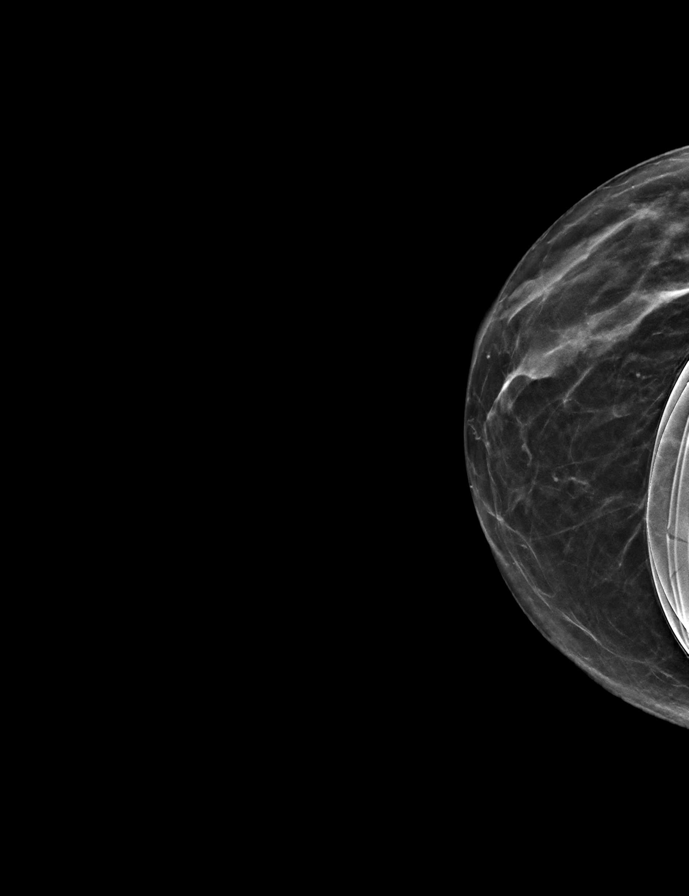

[L MLO synth-2D]
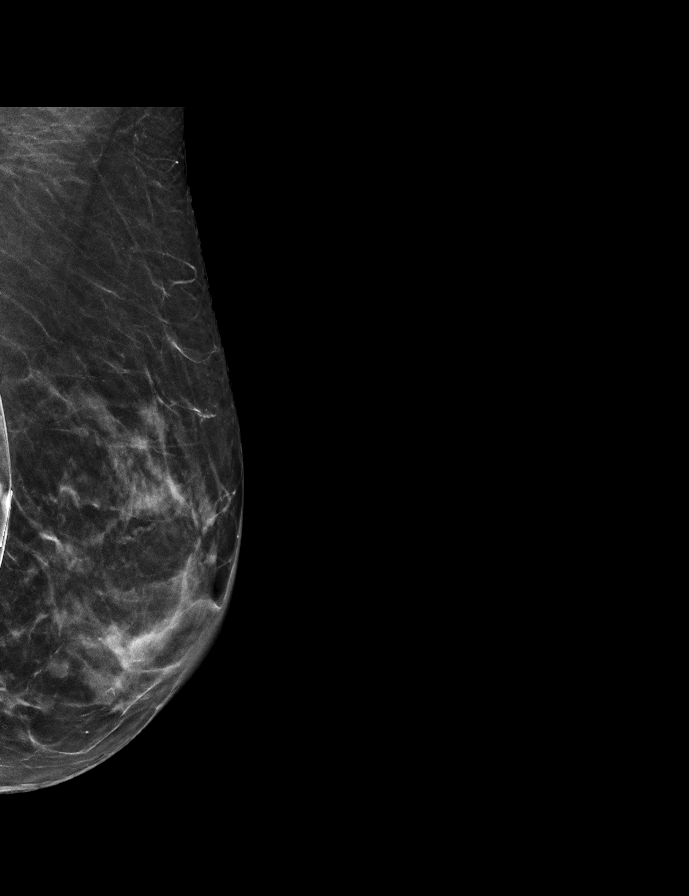

[L CCID BREAST TOMOSYNTHESIS IMAGE tomo · tomo slice 27/53.0]
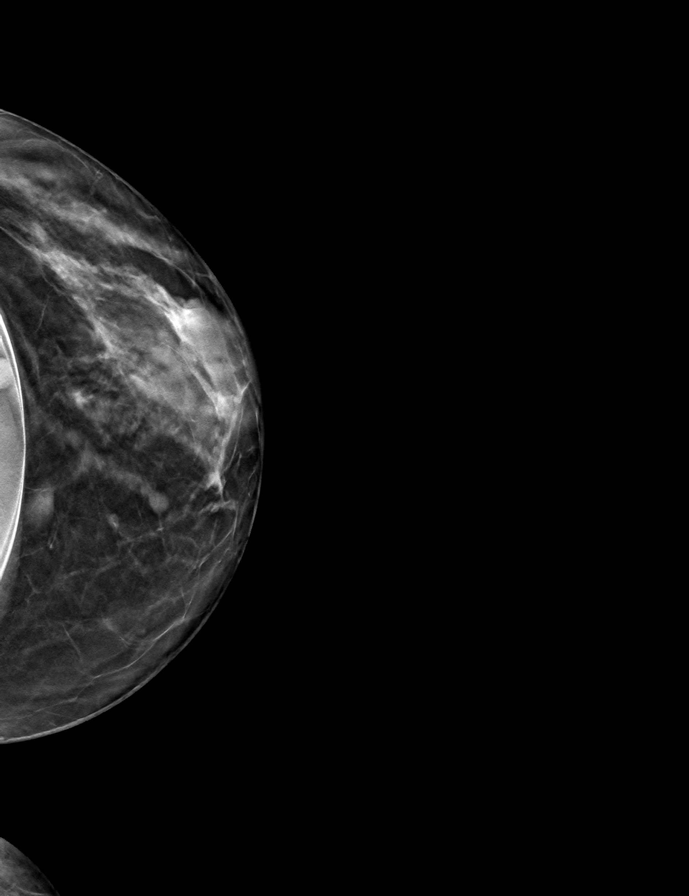

[9 of 28 positions shown; findings below may reference images not displayed]

ACR Breast Density Category c: The breast tissue is heterogeneously
dense, which may obscure small masses.
FINDINGS: The patient has retropectoral saline implants.

In the left breast, a possible focal asymmetry warrants further
evaluation. In the right breast, no suspicious masses or malignant
type calcifications are identified.
IMPRESSION: Further evaluation is suggested for possible focal asymmetry in the
left breast.

RECOMMENDATION:
Diagnostic mammogram and possibly ultrasound of the left breast.
(Code:[7J])

The patient will be contacted regarding the findings, and additional
imaging will be scheduled.

BI-RADS CATEGORY  0: Incomplete. Need additional imaging evaluation
and/or prior mammograms for comparison.

## 2021-07-03 ENCOUNTER — Other Ambulatory Visit: Payer: Self-pay | Admitting: Physician Assistant

## 2021-07-03 DIAGNOSIS — R928 Other abnormal and inconclusive findings on diagnostic imaging of breast: Secondary | ICD-10-CM

## 2021-07-08 ENCOUNTER — Ambulatory Visit: Payer: 59 | Admitting: Physician Assistant

## 2021-07-09 ENCOUNTER — Other Ambulatory Visit: Payer: Self-pay

## 2021-07-09 ENCOUNTER — Ambulatory Visit (INDEPENDENT_AMBULATORY_CARE_PROVIDER_SITE_OTHER): Payer: 59 | Admitting: Physician Assistant

## 2021-07-09 VITALS — BP 92/65 | HR 80 | Temp 97.8°F | Ht 63.75 in | Wt 138.4 lb

## 2021-07-09 DIAGNOSIS — F411 Generalized anxiety disorder: Secondary | ICD-10-CM

## 2021-07-09 DIAGNOSIS — M545 Low back pain, unspecified: Secondary | ICD-10-CM

## 2021-07-09 NOTE — Progress Notes (Signed)
Subjective:    Patient ID: [name] [name], female    DOB: Feb 01, 1971, 51 y.o.   MRN: [id number]  Chief Complaint  Patient presents with   Follow-up    HPI Patient is in today for follow-up on GAD. She has been taking Klonopin 0.5 mg 1-2 tab daily, which helps her symptoms. Daily SSRIs such as Lexapro previously made her nauseated. She has also been taking probiotics, which seems to help her anxiety and colon health.   She is also having lower back pain x 1 week. NKI. She has been taking Advil and Tylenol. No radiation down legs. Worse with certain movements or lifting heavier objects.   Past Medical History:  Diagnosis Date   Anxiety    Depression    GERD (gastroesophageal reflux disease)    History of back surgery 12/18/2018   IBS (irritable bowel syndrome)     Past Surgical History:  Procedure Laterality Date   AUGMENTATION MAMMAPLASTY Bilateral 03/2017   BACK SURGERY Bilateral 12/18/2018   BILATERAL SALPINGECTOMY Bilateral 10/31/2012   Procedure: BILATERAL SALPINGECTOMY;  Surgeon: [name] Speller, MD;  Location: [facility];  Service: Gynecology;  Laterality: Bilateral;   BREAST ENHANCEMENT SURGERY  04/12/2017   CESAREAN SECTION     CYSTO WITH HYDRODISTENSION N/A 10/31/2012   Procedure: CYSTOSCOPY/HYDRODISTENSION;  Surgeon: [name] Packer, MD;  Location: [facility];  Service: Urology;  Laterality: N/A;   DILATION AND CURETTAGE OF UTERUS     DILITATION & CURRETTAGE/HYSTROSCOPY WITH NOVASURE ABLATION N/A 10/31/2012   Procedure: DILATATION & CURETTAGE/HYSTEROSCOPY WITH NOVASURE ABLATION;  Surgeon: [name] Speller, MD;  Location: [facility];  Service: Gynecology;  Laterality: N/A;  Dr [name] [name] to start case with a cysto at beginning of case while patient under anesthesia.   ENDOMETRIAL BIOPSY  09/19/12   neg   LAPAROSCOPY Bilateral 10/31/2012   Procedure: LAPAROSCOPY OPERATIVE;  Surgeon: [name] Speller, MD;  Location: [facility];  Service: Gynecology;  Laterality: Bilateral;    LEFT HEART CATH AND CORONARY ANGIOGRAPHY N/A 04/28/2021   Procedure: LEFT HEART CATH AND CORONARY ANGIOGRAPHY;  Surgeon: Martinique, Peter M, MD;  Location: [facility];  Service: Cardiovascular;  Laterality: N/A;    Family History  Problem Relation Age of Onset   Stroke Father    Non-Hodgkin's lymphoma Father    Hypertension Father    Kidney disease Father    Hypertension Mother    Club foot Son     Social History   Tobacco Use   Smoking status: Former    Types: Cigarettes    Quit date: 01/15/2013    Years since quitting: 8.4   Smokeless tobacco: Never  Vaping Use   Vaping Use: Former   Quit date: 07/18/2018  Substance Use Topics   Alcohol use: Yes    Alcohol/week: 5.0 standard drinks    Types: 5 Standard drinks or equivalent per week   Drug use: Yes    Types: Barbituates     Allergies  Allergen Reactions   Ciprofloxacin Nausea Only    Review of Systems NEGATIVE UNLESS OTHERWISE INDICATED IN HPI      Objective:     BP 92/65    Pulse 80    Temp 97.8 F (36.6 C)    Ht 5' 3.75" (1.619 [name])    Wt 138 lb 6.1 oz (62.8 kg)    LMP  (LMP Unknown)    SpO2 99%    BMI 23.94 kg/[name]   Wt Readings from Last 3 Encounters:  07/09/21 138 lb 6.1 oz (62.8 kg)  05/18/21 140 lb 3.2 oz (63.6 kg)  05/05/21 138 lb 8 oz (62.8 kg)    BP Readings from Last 3 Encounters:  07/09/21 92/65  05/18/21 106/73  05/05/21 110/74     Physical Exam Vitals and nursing note reviewed.  Constitutional:      Appearance: Normal appearance. She is normal weight. She is not toxic-appearing.  HENT:     Head: Normocephalic and atraumatic.     Right Ear: External ear normal.     Left Ear: External ear normal.     Nose: Nose normal.     Mouth/Throat:     Mouth: Mucous membranes are moist.  Eyes:     Extraocular Movements: Extraocular movements intact.     Conjunctiva/sclera: Conjunctivae normal.     Pupils: Pupils are equal, round, and reactive to light.  Cardiovascular:     Rate and Rhythm:  Normal rate and regular rhythm.     Pulses: Normal pulses.     Heart sounds: Normal heart sounds.  Pulmonary:     Effort: Pulmonary effort is normal.     Breath sounds: Normal breath sounds.  Musculoskeletal:        General: Normal range of motion.     Cervical back: Normal range of motion and neck supple.     Lumbar back: Negative right straight leg raise test and negative left straight leg raise test.  Skin:    General: Skin is warm and dry.  Neurological:     General: No focal deficit present.     Mental Status: She is alert and oriented to person, place, and time.  Psychiatric:        Mood and Affect: Mood normal.        Behavior: Behavior normal.        Thought Content: Thought content normal.        Judgment: Judgment normal.       Assessment & Plan:   Problem List Items Addressed This Visit       Other   GAD (generalized anxiety disorder) - Primary   Back pain    1. GAD (generalized anxiety disorder) PDMP reviewed today, no red flags, filling appropriately.  Stable with Klonopin 0.5 mg 1 to two tab daily. She is also doing a good job with exercise / yoga/ stress reducing measures. F/up in 6 months  2. Acute bilateral low back pain without sciatica Seems to be MSK / strain in nature. Encouraged rest, ice method. She will try Salonpas patches. She will f/up if worse or no imp. No red flags noted.    [name] Selkirk [name] [name] Martenson, PA-C

## 2021-07-31 ENCOUNTER — Ambulatory Visit
Admission: RE | Admit: 2021-07-31 | Discharge: 2021-07-31 | Disposition: A | Discharge status: Home / Self Care | Payer: 59 | Source: Ambulatory Visit | Attending: Physician Assistant | Admitting: Physician Assistant

## 2021-07-31 ENCOUNTER — Other Ambulatory Visit: Payer: Self-pay | Admitting: Physician Assistant

## 2021-07-31 DIAGNOSIS — R928 Other abnormal and inconclusive findings on diagnostic imaging of breast: Secondary | ICD-10-CM

## 2021-07-31 IMAGING — MG MM DIGITAL DIAGNOSTIC UNILAT*L* W/ TOMO W/ CAD
4 series · 4 of 12 positions shown · non-contrast
Comparison: Previous exams including recent screening mammogram
dated [DATE].

CLINICAL DATA: Patient returns today to evaluate a possible LEFT
breast mass identified on recent screening mammogram.

EXAM:
DIGITAL DIAGNOSTIC UNILATERAL LEFT MAMMOGRAM WITH TOMOSYNTHESIS AND
CAD; ULTRASOUND LEFT BREAST LIMITED
TECHNIQUE: Left digital diagnostic mammography and breast tomosynthesis was
performed. The images were evaluated with computer-aided detection.;
Targeted ultrasound examination of the left breast was performed.

[L MLO synth-2D]
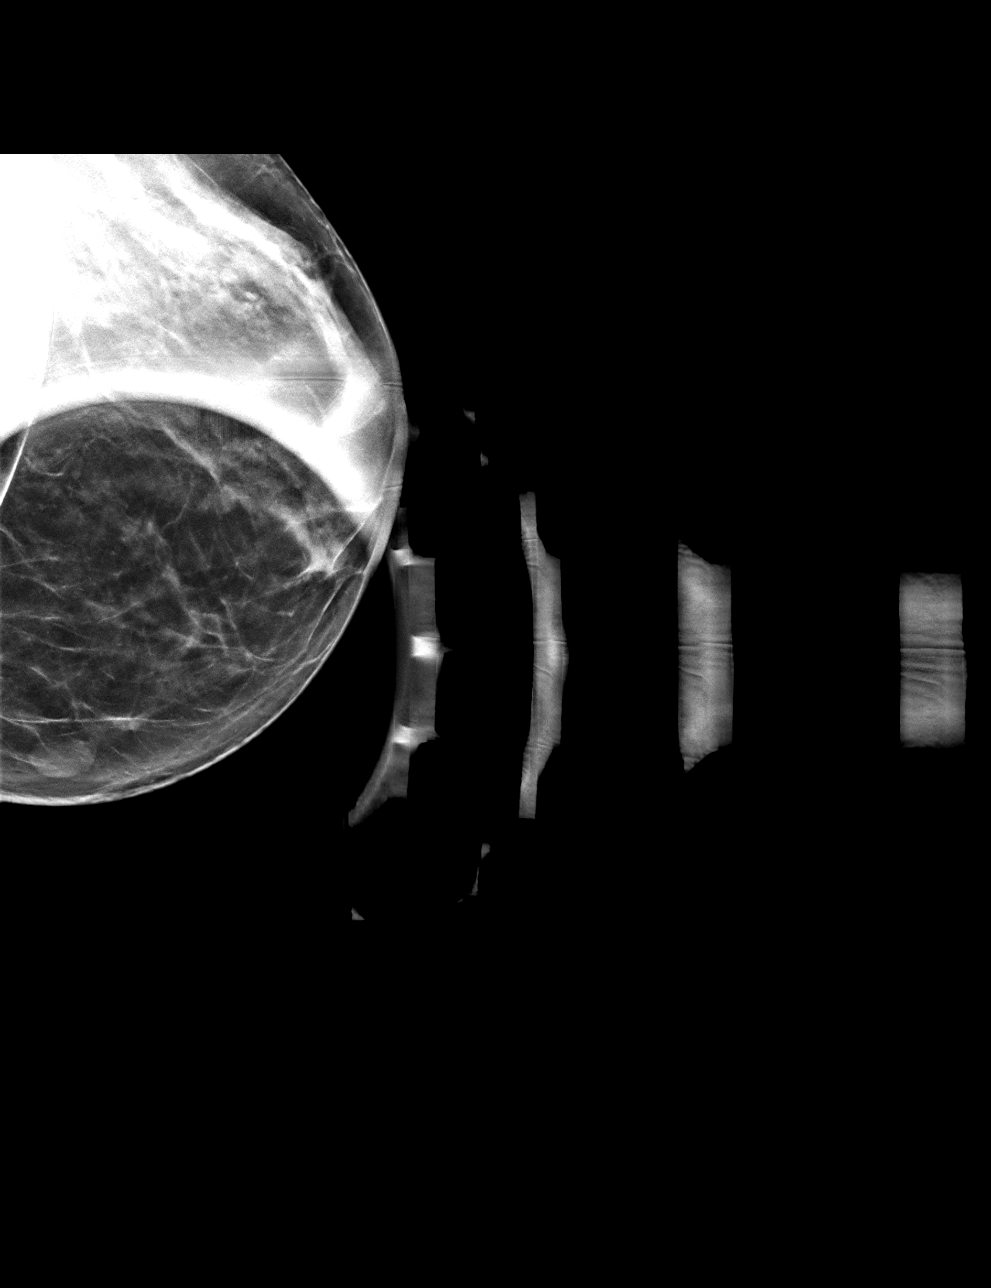

[L CC synth-2D]
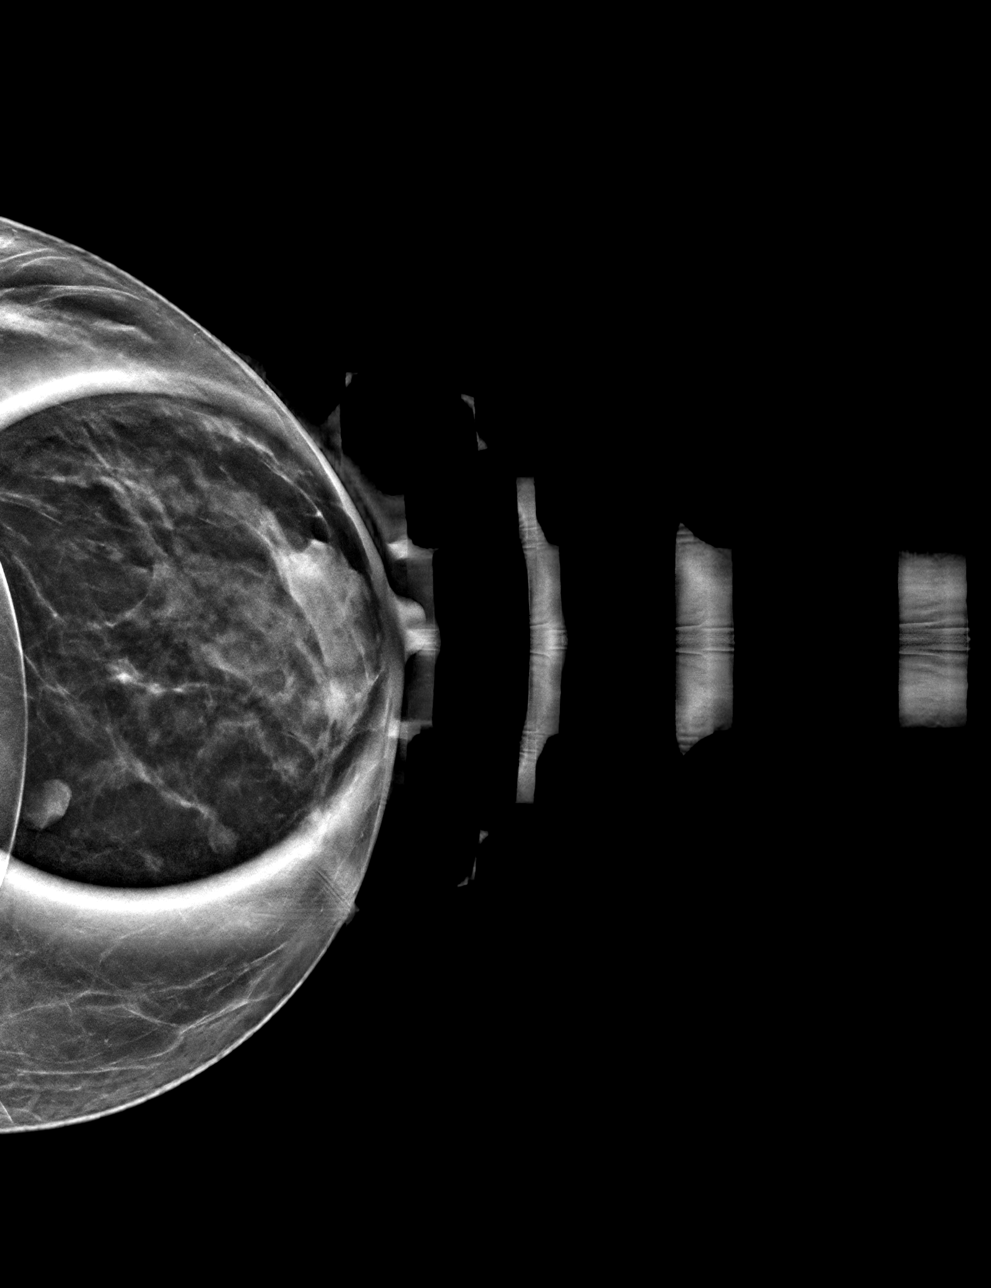

[L MLO tomo · tomo slice 24/47.0]
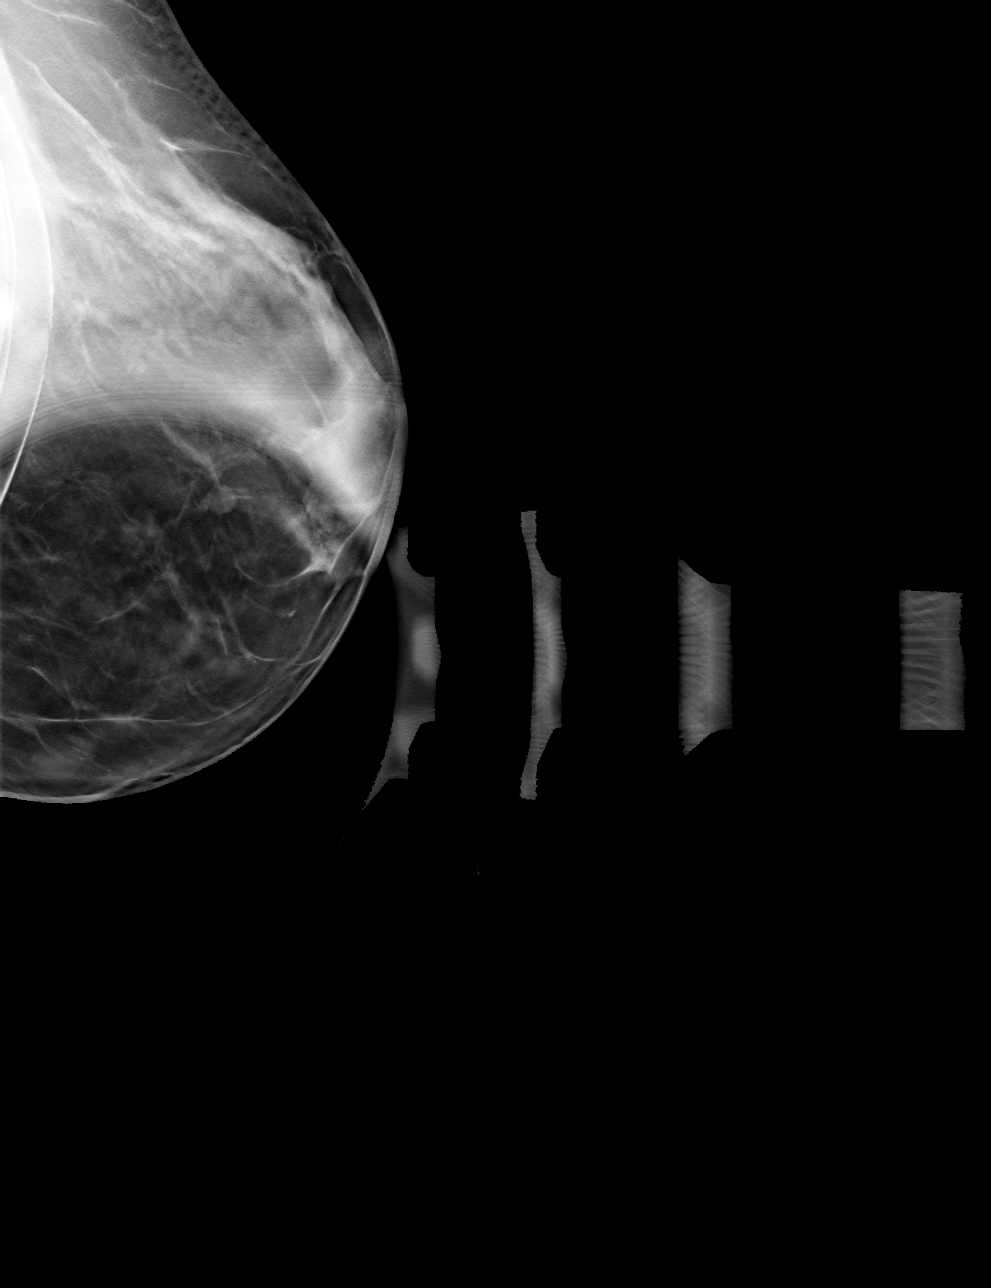

[L CC tomo · tomo slice 25/49.0]
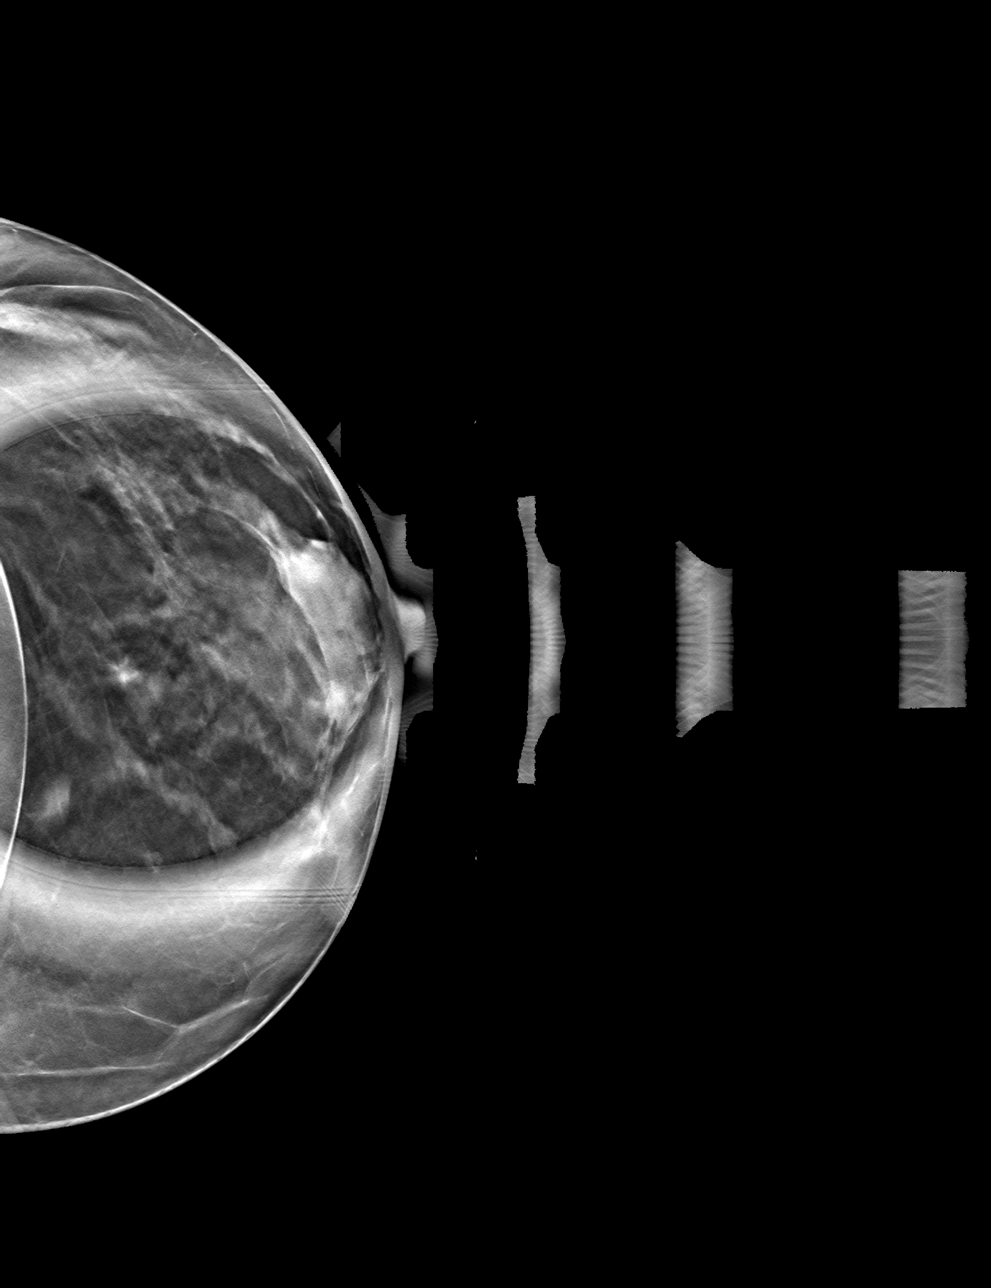

[4 of 12 positions shown; findings below may reference images not displayed]

ACR Breast Density Category c: The breast tissue is heterogeneously
dense, which may obscure small masses.
FINDINGS: On today's additional diagnostic views with spot compression and 3D
tomosynthesis, an oval circumscribed mass is confirmed within the
lower inner quadrant of the LEFT breast, measuring approximately 9
mm greatest dimension. The patient has retropectoral implants.

Targeted ultrasound is performed, showing a benign simple cyst in
the LEFT breast at the 7 o'clock axis, 9 cm from the nipple,
measuring 9 mm, corresponding to the mammographic finding
IMPRESSION: No evidence of malignancy. Benign simple cyst in the LEFT breast at
the 7 o'clock axis, measuring 9 mm, corresponding to the
mammographic finding.

Patient may return to routine annual bilateral screening mammogram
schedule.

RECOMMENDATION:
Screening mammogram in one year.(Code:[R3])

I have discussed the findings and recommendations with the patient.
If applicable, a reminder letter will be sent to the patient
regarding the next appointment.

BI-RADS CATEGORY  2: Benign.

## 2021-07-31 IMAGING — US US BREAST*L* LIMITED INC AXILLA
1 series · 5 of 5 positions shown · non-contrast
Comparison: Previous exams including recent screening mammogram
dated [DATE].

CLINICAL DATA: Patient returns today to evaluate a possible LEFT
breast mass identified on recent screening mammogram.

EXAM:
DIGITAL DIAGNOSTIC UNILATERAL LEFT MAMMOGRAM WITH TOMOSYNTHESIS AND
CAD; ULTRASOUND LEFT BREAST LIMITED
TECHNIQUE: Left digital diagnostic mammography and breast tomosynthesis was
performed. The images were evaluated with computer-aided detection.;
Targeted ultrasound examination of the left breast was performed.

[Series 1: us breast*left* limited inc axilla · 0.06mm/px · 5 of 5 slices shown]
[im 1/5]
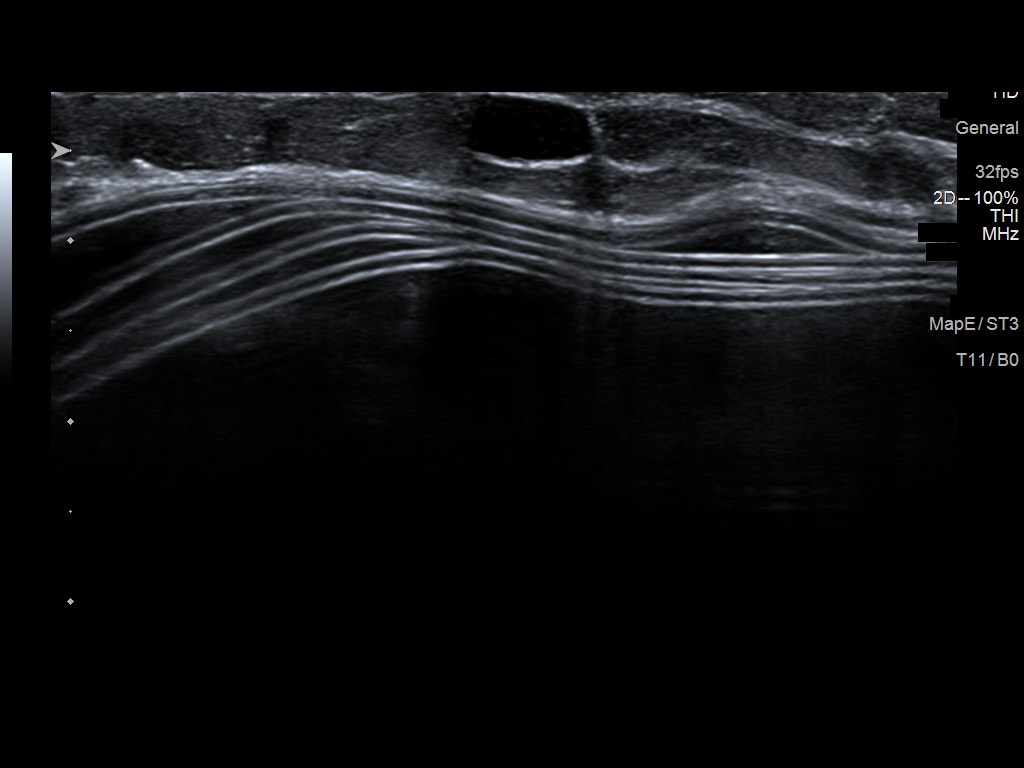
[im 2/5]
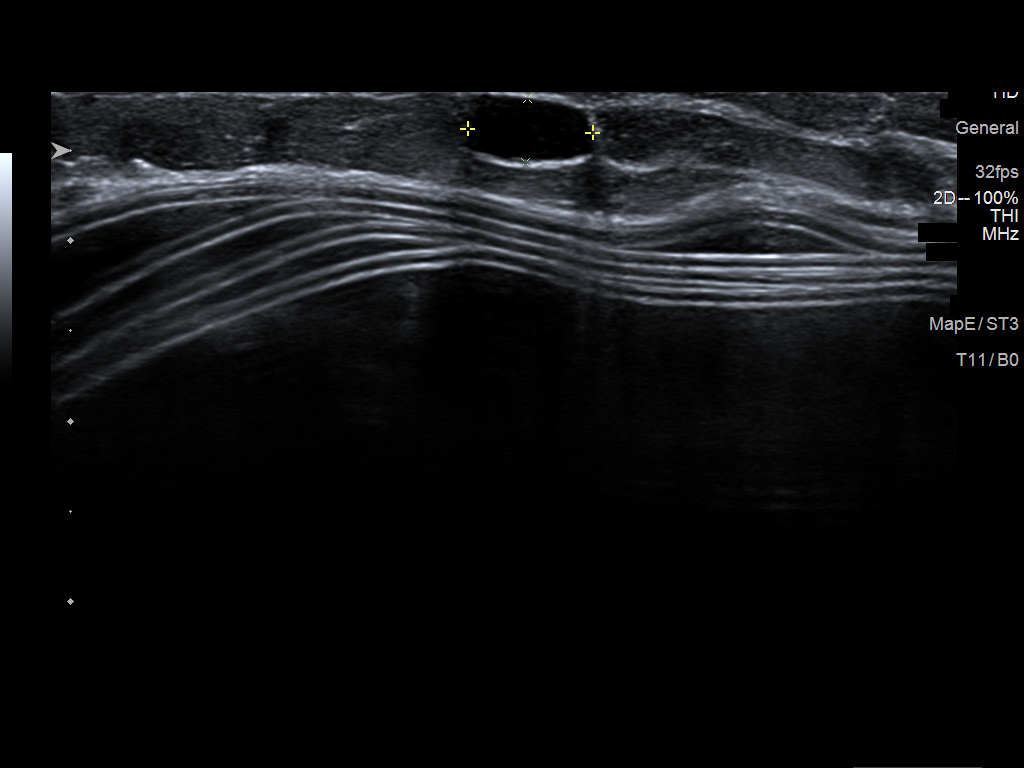
[im 3/5]
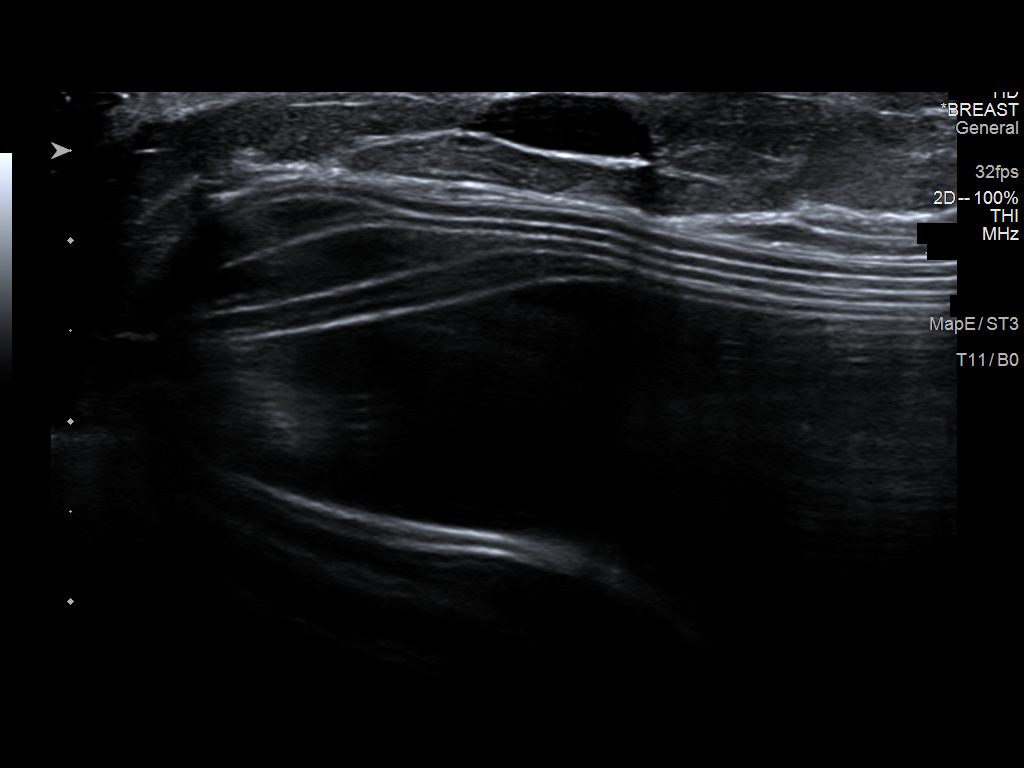
[im 4/5]
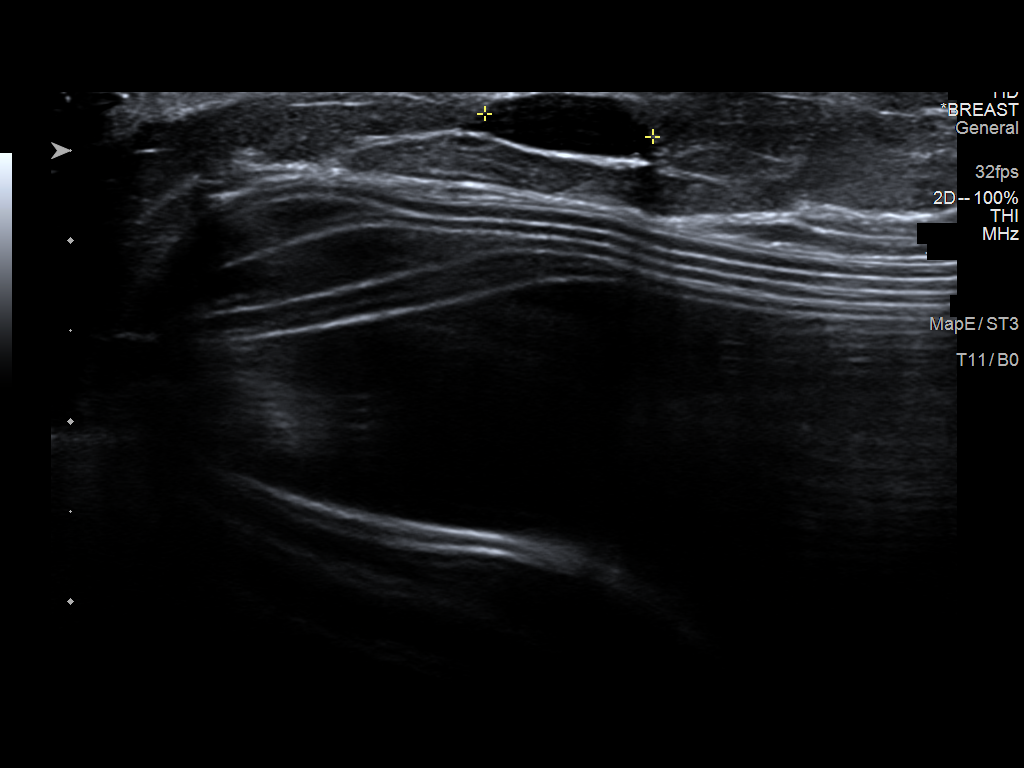
[im 5/5]
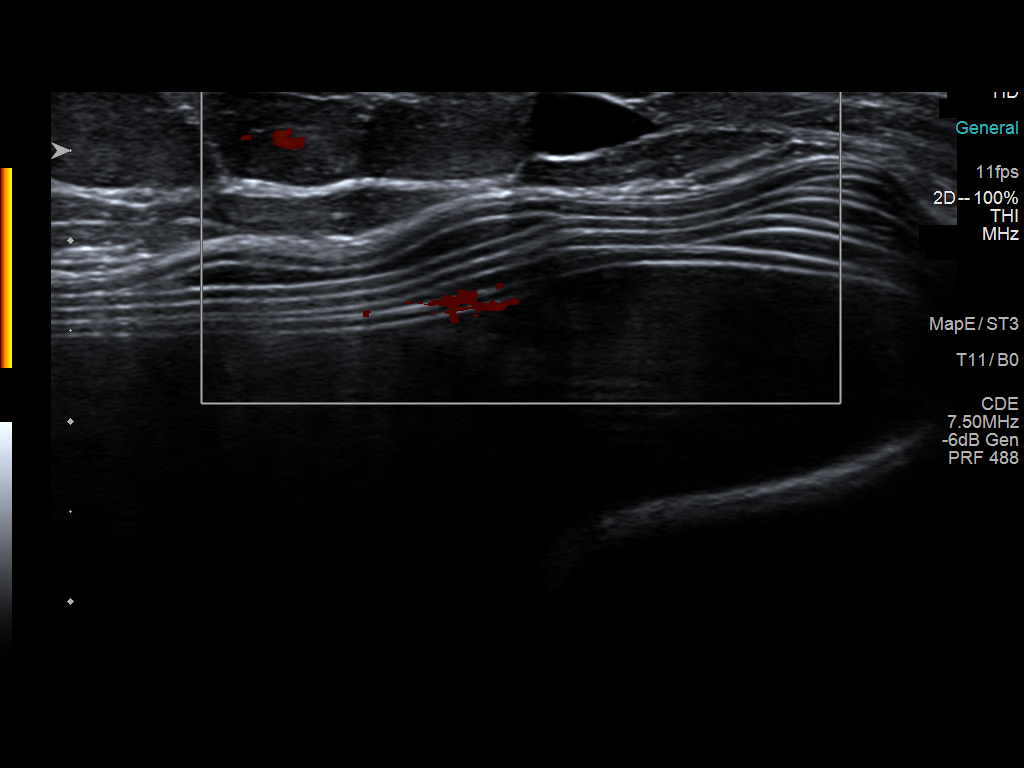

[5 of 5 positions shown; findings below may reference images not displayed]

ACR Breast Density Category c: The breast tissue is heterogeneously
dense, which may obscure small masses.
FINDINGS: On today's additional diagnostic views with spot compression and 3D
tomosynthesis, an oval circumscribed mass is confirmed within the
lower inner quadrant of the LEFT breast, measuring approximately 9
mm greatest dimension. The patient has retropectoral implants.

Targeted ultrasound is performed, showing a benign simple cyst in
the LEFT breast at the 7 o'clock axis, 9 cm from the nipple,
measuring 9 mm, corresponding to the mammographic finding
IMPRESSION: No evidence of malignancy. Benign simple cyst in the LEFT breast at
the 7 o'clock axis, measuring 9 mm, corresponding to the
mammographic finding.

Patient may return to routine annual bilateral screening mammogram
schedule.

RECOMMENDATION:
Screening mammogram in one year.(Code:[R3])

I have discussed the findings and recommendations with the patient.
If applicable, a reminder letter will be sent to the patient
regarding the next appointment.

BI-RADS CATEGORY  2: Benign.

## 2021-08-23 ENCOUNTER — Other Ambulatory Visit: Payer: Self-pay | Admitting: Physician Assistant

## 2021-11-27 ENCOUNTER — Other Ambulatory Visit: Payer: Self-pay | Admitting: Physician Assistant

## 2021-12-07 ENCOUNTER — Ambulatory Visit (INDEPENDENT_AMBULATORY_CARE_PROVIDER_SITE_OTHER): Payer: 59 | Admitting: Obstetrics & Gynecology

## 2021-12-07 ENCOUNTER — Encounter (HOSPITAL_BASED_OUTPATIENT_CLINIC_OR_DEPARTMENT_OTHER): Payer: Self-pay | Admitting: Obstetrics & Gynecology

## 2021-12-07 VITALS — BP 120/89 | HR 83 | Ht 64.0 in | Wt 140.0 lb

## 2021-12-07 DIAGNOSIS — Z01419 Encounter for gynecological examination (general) (routine) without abnormal findings: Secondary | ICD-10-CM

## 2021-12-07 DIAGNOSIS — F419 Anxiety disorder, unspecified: Secondary | ICD-10-CM

## 2021-12-07 DIAGNOSIS — N951 Menopausal and female climacteric states: Secondary | ICD-10-CM | POA: Diagnosis not present

## 2021-12-07 DIAGNOSIS — L900 Lichen sclerosus et atrophicus: Secondary | ICD-10-CM | POA: Diagnosis not present

## 2021-12-07 MED ORDER — PROGESTERONE MICRONIZED 100 MG PO CAPS: ORAL_CAPSULE | ORAL | 1 refills | Status: DC

## 2021-12-07 MED ORDER — CLOBETASOL PROPIONATE 0.05 % EX OINT: TOPICAL_OINTMENT | CUTANEOUS | 2 refills | Status: AC

## 2021-12-07 MED ORDER — NONFORMULARY OR COMPOUNDED ITEM: 1 refills | Status: DC

## 2021-12-07 NOTE — Progress Notes (Signed)
51 y.o. [id number] Divorced White or Caucasian female here for annual exam.  Doing well.  Son just graduated from high school.  Doing an apprenticeship.    Had anxiety issues in the winter.  Ended up with cardiology and had cardiac cath with Dr. Martinique.  Evaluation was all normal.    Has been able to get better control of anxiety and is feeling much better.  Denies vaginal bleeding.  Having some dryness and painful intercourse and decreased libido.  Went to [facility] MD.  Testosterone was low.  She started pellets and testosterone was about 600.  Did try progesterone at night.  This helped with sleep.  Estrogen patch  No LMP recorded (lmp unknown). Patient is postmenopausal.          Sexually active: Yes.    The current method of family planning is post menopausal status.    Smoker:  no  Health Maintenance: Pap:  pap neg with neg HR HPV 2021 History of abnormal Pap:  h/o ASCUS with +HR HPV MMG:  07/2021 Colonoscopy:  2020, follow up 10 years BMD:   not indicated Screening Labs: lab work done 03/2021   reports that she quit smoking about 8 years ago. Her smoking use included cigarettes. She has never used smokeless tobacco. She reports current alcohol use of about 5.0 standard drinks of alcohol per week. She reports current drug use. Drug: Barbituates.  Past Medical History:  Diagnosis Date   Anxiety    Depression    GERD (gastroesophageal reflux disease)    History of back surgery 12/18/2018   IBS (irritable bowel syndrome)     Past Surgical History:  Procedure Laterality Date   AUGMENTATION MAMMAPLASTY Bilateral 03/2017   BACK SURGERY Bilateral 12/18/2018   BILATERAL SALPINGECTOMY Bilateral 10/31/2012   Procedure: BILATERAL SALPINGECTOMY;  Surgeon: Lyman Speller, MD;  Location: [facility];  Service: Gynecology;  Laterality: Bilateral;   BREAST ENHANCEMENT SURGERY  04/12/2017   CESAREAN SECTION     CYSTO WITH HYDRODISTENSION N/A 10/31/2012   Procedure: CYSTOSCOPY/HYDRODISTENSION;   Surgeon: Reece Packer, MD;  Location: [facility];  Service: Urology;  Laterality: N/A;   DILATION AND CURETTAGE OF UTERUS     DILITATION & CURRETTAGE/HYSTROSCOPY WITH NOVASURE ABLATION N/A 10/31/2012   Procedure: DILATATION & CURETTAGE/HYSTEROSCOPY WITH NOVASURE ABLATION;  Surgeon: Lyman Speller, MD;  Location: [facility];  Service: Gynecology;  Laterality: N/A;  Dr Suzanne Boron Diarmid to start case with a cysto at beginning of case while patient under anesthesia.   ENDOMETRIAL BIOPSY  09/19/12   neg   LAPAROSCOPY Bilateral 10/31/2012   Procedure: LAPAROSCOPY OPERATIVE;  Surgeon: Lyman Speller, MD;  Location: [facility];  Service: Gynecology;  Laterality: Bilateral;   LEFT HEART CATH AND CORONARY ANGIOGRAPHY N/A 04/28/2021   Procedure: LEFT HEART CATH AND CORONARY ANGIOGRAPHY;  Surgeon: Martinique, Peter M, MD;  Location: [facility];  Service: Cardiovascular;  Laterality: N/A;    Current Outpatient Medications  Medication Sig Dispense Refill   clobetasol ointment (TEMOVATE) 4.25 % Apply 1 application topically 2 (two) times daily. 30 g 1   clonazePAM (KLONOPIN) 0.5 MG tablet TAKE 1 TABLET BY MOUTH TWICE DAILY AS NEEDED FOR ANXIETY 60 tablet 0   Ibuprofen-Acetaminophen (ADVIL DUAL ACTION PO) Take 1 tablet by mouth as needed.     progesterone (PROMETRIUM) 100 MG capsule Take 1-2 capsules nightly as needed. 180 capsule 1   VITAMIN D PO Take by mouth daily in the afternoon.  NONFORMULARY OR COMPOUNDED ITEM Estradiol 0.25 / testosterone 0.49m/ml intravaginal cream.  131mpv 2 to 3 times weekly.  Dispense 3 month supply/1RF 1 each 1   No current facility-administered medications for this visit.    Family History  Problem Relation Age of Onset   Stroke Father    Non-Hodgkin's lymphoma Father    Hypertension Father    Kidney disease Father    Hypertension Mother    Club foot Son    ROS: Genitourinary:negative  Exam:   BP 120/89 (BP Location: Right Arm, Patient Position: Sitting, Cuff  Size: Normal)   Pulse 83   Ht _0  (1.626 m) Comment: Reported  Wt 140 lb (63.5 kg)   LMP  (LMP Unknown)   BMI 24.03 kg/m   Height: _1  (162.6 cm) (Reported)  General appearance: alert, cooperative and appears stated age Head: Normocephalic, without obvious abnormality, atraumatic Neck: no adenopathy, supple, symmetrical, trachea midline and thyroid normal to inspection and palpation Lungs: clear to auscultation bilaterally Breasts: normal appearance, no masses or tenderness Heart: regular rate and rhythm Abdomen: soft, non-tender; bowel sounds normal; no masses,  no organomegaly Extremities: extremities normal, atraumatic, no cyanosis or edema Skin: Skin color, texture, turgor normal. No rashes or lesions Lymph nodes: Cervical, supraclavicular, and axillary nodes normal. No abnormal inguinal nodes palpated Neurologic: Grossly normal   Pelvic: External genitalia:  no lesions              Urethra:  normal appearing urethra with no masses, tenderness or lesions              Bartholins and Skenes: normal                 Vagina: normal appearing vagina with normal color and no discharge, no lesions              Cervix: no lesions              Pap taken: No. Bimanual Exam:  Uterus:  normal size, contour, position, consistency, mobility, non-tender              Adnexa: normal adnexa and no mass, fullness, tenderness               Rectovaginal: Confirms               Anus:  normal sphincter tone, no lesions  Chaperone, ToOctaviano BattyCMA, was present for exam.  Assessment/Plan: 1. Well woman exam with routine gynecological exam - pap neg with neg HR HPV 06/2019.  [facility] - MMG 07/2021 - colonoscopy 2020 - vaccines discussed.  Considering shingrix vaccination  2. Menopausal symptoms - will start estradiol and testosterone cream and progesterone at night.   - Recheck 3 months - NONFORMULARY OR COMPOUNDED  ITEM; Estradiol 0.25 / testosterone 0.2555ml intravaginal cream.  1ml23m 2 to 3 times weekly.  Dispense 3 month supply/1RF  Dispense: 1 each; Refill: 1 - progesterone (PROMETRIUM) 100 MG capsule; Take 1-2 capsules nightly as needed.  Dispense: 180 capsule; Refill: 1  3. Anxiety  4. Lichen sclerosus - rx for clobetasol 0.05% ointment topically nightly x 4 weeks.  Then apply topically twice weekly until follow up.

## 2021-12-17 ENCOUNTER — Encounter (HOSPITAL_BASED_OUTPATIENT_CLINIC_OR_DEPARTMENT_OTHER): Payer: Self-pay | Admitting: *Deleted

## 2022-01-06 ENCOUNTER — Encounter: Payer: Self-pay | Admitting: Physician Assistant

## 2022-01-06 ENCOUNTER — Ambulatory Visit (INDEPENDENT_AMBULATORY_CARE_PROVIDER_SITE_OTHER): Payer: 59 | Admitting: Physician Assistant

## 2022-01-06 VITALS — BP 108/68 | HR 74 | Temp 97.3°F | Ht 64.0 in | Wt 141.0 lb

## 2022-01-06 DIAGNOSIS — F411 Generalized anxiety disorder: Secondary | ICD-10-CM

## 2022-01-06 DIAGNOSIS — Z0000 Encounter for general adult medical examination without abnormal findings: Secondary | ICD-10-CM

## 2022-01-06 DIAGNOSIS — Z1159 Encounter for screening for other viral diseases: Secondary | ICD-10-CM | POA: Diagnosis not present

## 2022-01-06 MED ORDER — CLONAZEPAM 0.5 MG PO TABS: 0.5000 mg | ORAL_TABLET | Freq: Two times a day (BID) | ORAL | 2 refills | Status: DC | PRN

## 2022-01-06 NOTE — Progress Notes (Signed)
Subjective:    Patient ID: [name] [name], female    DOB: 03/28/1971, 51 y.o.   MRN: [id number]  Chief Complaint  Patient presents with   Annual Exam    Pt in for annual CPE; pt wasn't aware to be fasting; pt is ok with scheduling lab appt to come in fasting; pt needing refill and med in cart for approval    HPI Patient is in today for annual exam. Doing well overall. Getting married in [place] next weekend.   Acute concerns: -No health concerns today -Possibly had COVID a few weeks ago, but says symptoms have resolved other than some headaches.   Health maintenance: Lifestyle/ exercise: Walking daily; just moved into fiance's house Nutrition: Cooking at home, well-balanced  Mental health: Doing good; Klonopin Rx only 1-2 times daily as needed Caffeine: Rare, only occ for headaches  Sleep: Doing good  Substance use: None  ETOH: Occasionally  Sexual activity: Monogamous  Immunizations: Needs 2nd shingrix, will wait til next visit  Colonoscopy: UTD Pap: UTD Mammogram: UTD   Past Medical History:  Diagnosis Date   Anxiety    Depression    GERD (gastroesophageal reflux disease)    History of back surgery 12/18/2018   IBS (irritable bowel syndrome)     Past Surgical History:  Procedure Laterality Date   AUGMENTATION MAMMAPLASTY Bilateral 03/2017   BACK SURGERY Bilateral 12/18/2018   BILATERAL SALPINGECTOMY Bilateral 10/31/2012   Procedure: BILATERAL SALPINGECTOMY;  Surgeon: Lyman Speller, MD;  Location: [facility];  Service: Gynecology;  Laterality: Bilateral;   BREAST ENHANCEMENT SURGERY  04/12/2017   CESAREAN SECTION     CYSTO WITH HYDRODISTENSION N/A 10/31/2012   Procedure: CYSTOSCOPY/HYDRODISTENSION;  Surgeon: Reece Packer, MD;  Location: [facility];  Service: Urology;  Laterality: N/A;   DILATION AND CURETTAGE OF UTERUS     DILITATION & CURRETTAGE/HYSTROSCOPY WITH NOVASURE ABLATION N/A 10/31/2012   Procedure: DILATATION & CURETTAGE/HYSTEROSCOPY WITH NOVASURE  ABLATION;  Surgeon: Lyman Speller, MD;  Location: [facility];  Service: Gynecology;  Laterality: N/A;  Dr Suzanne Boron Diarmid to start case with a cysto at beginning of case while patient under anesthesia.   ENDOMETRIAL BIOPSY  09/19/12   neg   LAPAROSCOPY Bilateral 10/31/2012   Procedure: LAPAROSCOPY OPERATIVE;  Surgeon: Lyman Speller, MD;  Location: [facility];  Service: Gynecology;  Laterality: Bilateral;   LEFT HEART CATH AND CORONARY ANGIOGRAPHY N/A 04/28/2021   Procedure: LEFT HEART CATH AND CORONARY ANGIOGRAPHY;  Surgeon: Martinique, Peter M, MD;  Location: [place];  Service: Cardiovascular;  Laterality: N/A;    Family History  Problem Relation Age of Onset   Stroke Father    Non-Hodgkin's lymphoma Father    Hypertension Father    Kidney disease Father    Hypertension Mother    Club foot Son     Social History   Tobacco Use   Smoking status: Former    Types: Cigarettes    Quit date: 01/15/2013    Years since quitting: 8.9   Smokeless tobacco: Never  Vaping Use   Vaping Use: Former   Quit date: 07/18/2018  Substance Use Topics   Alcohol use: Yes    Alcohol/week: 5.0 standard drinks of alcohol    Types: 5 Standard drinks or equivalent per week   Drug use: Yes    Types: Barbituates     Allergies  Allergen Reactions   Ciprofloxacin Nausea Only    Review of Systems NEGATIVE UNLESS OTHERWISE INDICATED IN HPI  Objective:     BP 108/68 (BP Location: Left Arm)   Pulse 74   Temp (!) 97.3 F (36.3 C) (Temporal)   Ht _0  (1.626 m)   Wt 141 lb (64 kg)   LMP  (LMP Unknown)   SpO2 99%   BMI 24.20 kg/m   Wt Readings from Last 3 Encounters:  01/06/22 141 lb (64 kg)  12/07/21 140 lb (63.5 kg)  07/09/21 138 lb 6.1 oz (62.8 kg)    BP Readings from Last 3 Encounters:  01/06/22 108/68  12/07/21 120/89  07/09/21 92/65     Physical Exam Vitals and nursing note reviewed.  Constitutional:      Appearance: Normal appearance. She is normal weight. She is  not toxic-appearing.  HENT:     Head: Normocephalic and atraumatic.     Right Ear: Tympanic membrane, ear canal and external ear normal.     Left Ear: Tympanic membrane, ear canal and external ear normal.     Nose: Nose normal.     Mouth/Throat:     Mouth: Mucous membranes are moist.  Eyes:     Extraocular Movements: Extraocular movements intact.     Conjunctiva/sclera: Conjunctivae normal.     Pupils: Pupils are equal, round, and reactive to light.  Cardiovascular:     Rate and Rhythm: Normal rate and regular rhythm.     Pulses: Normal pulses.     Heart sounds: Normal heart sounds.  Pulmonary:     Effort: Pulmonary effort is normal.     Breath sounds: Normal breath sounds.  Abdominal:     General: Abdomen is flat. Bowel sounds are normal.     Palpations: Abdomen is soft.  Musculoskeletal:        General: Normal range of motion.     Cervical back: Normal range of motion and neck supple.  Skin:    General: Skin is warm and dry.  Neurological:     General: No focal deficit present.     Mental Status: She is alert and oriented to person, place, and time.  Psychiatric:        Mood and Affect: Mood normal.        Behavior: Behavior normal.        Thought Content: Thought content normal.        Judgment: Judgment normal.        Assessment & Plan:   Problem List Items Addressed This Visit       Other   GAD (generalized anxiety disorder)   Other Visit Diagnoses     Encounter for annual physical exam    -  Primary   Relevant Orders   Hepatitis C Antibody   CBC with Differential/Platelet   Comprehensive metabolic panel   Lipid panel   TSH   Need for hepatitis C screening test       Relevant Orders   Hepatitis C Antibody        Meds ordered this encounter  Medications   clonazePAM (KLONOPIN) 0.5 MG tablet    Sig: Take 1 tablet (0.5 mg total) by mouth 2 (two) times daily as needed. for anxiety    Dispense:  60 tablet    Refill:  2   1. Encounter for annual  physical exam 2. Need for hepatitis C screening test Age-appropriate screening and counseling performed today. Will check labs and call with results. Preventive measures discussed and printed in AVS for patient.   Patient Counseling: _1   Nutrition: Stressed importance  of moderation in sodium/caffeine intake, saturated fat and cholesterol, caloric balance, sufficient intake of fresh fruits, vegetables, and fiber.  _0   Stressed the importance of regular exercise.   _1   Substance Abuse: Discussed cessation/primary prevention of tobacco, alcohol, or other drug use; driving or other dangerous activities under the influence; availability of treatment for abuse.   _2   Injury prevention: Discussed safety belts, safety helmets, smoke detector, smoking near bedding or upholstery.   _3   Sexuality: Discussed sexually transmitted diseases, partner selection, use of condoms, avoidance of unintended pregnancy  and contraceptive alternatives.   _4   Dental health: Discussed importance of regular tooth brushing, flossing, and dental visits.  _5   Health maintenance and immunizations reviewed. Please refer to Health maintenance section.     3. GAD (generalized anxiety disorder) Stable PDMP reviewed today, no red flags, filling appropriately.  Refilled Klonopin 0.5 mg BID prn #60 with 2 refills.     Return in about 6 months (around 07/09/2022) for medication recheck; also please schedule fasting lab appt prior to Sept 1, 2023 .    Dajanee Voorheis M Linden Mikes, PA-C

## 2022-01-19 ENCOUNTER — Other Ambulatory Visit: Payer: Self-pay

## 2022-01-19 ENCOUNTER — Other Ambulatory Visit (INDEPENDENT_AMBULATORY_CARE_PROVIDER_SITE_OTHER): Payer: 59

## 2022-01-19 DIAGNOSIS — Z1159 Encounter for screening for other viral diseases: Secondary | ICD-10-CM

## 2022-01-19 DIAGNOSIS — Z0000 Encounter for general adult medical examination without abnormal findings: Secondary | ICD-10-CM

## 2022-01-19 LAB — COMPREHENSIVE METABOLIC PANEL
ALT: 22 U/L (ref 0–35)
AST: 21 U/L (ref 0–37)
Albumin: 4 g/dL (ref 3.5–5.2)
Alkaline Phosphatase: 72 U/L (ref 39–117)
BUN: 14 mg/dL (ref 6–23)
CO2: 25 mEq/L (ref 19–32)
Calcium: 8.9 mg/dL (ref 8.4–10.5)
Chloride: 106 mEq/L (ref 96–112)
Creatinine, Ser: 0.6 mg/dL (ref 0.40–1.20)
GFR: 102.46 mL/min (ref >60.00)
Glucose, Bld: 86 mg/dL (ref 70–99)
Potassium: 3.9 mEq/L (ref 3.5–5.1)
Sodium: 139 mEq/L (ref 135–145)
Total Bilirubin: 0.9 mg/dL (ref 0.2–1.2)
Total Protein: 6.6 g/dL (ref 6.0–8.3)

## 2022-01-19 LAB — CBC WITH DIFFERENTIAL/PLATELET
Basophils Absolute: 0 10*3/uL (ref 0.0–0.1)
Basophils Relative: 0.6 % (ref 0.0–3.0)
Eosinophils Absolute: 0.1 10*3/uL (ref 0.0–0.7)
Eosinophils Relative: 2.7 % (ref 0.0–5.0)
HCT: 35.1 % — ABNORMAL LOW (ref 36.0–46.0)
Hemoglobin: 12 g/dL (ref 12.0–15.0)
Lymphocytes Relative: 32 % (ref 12.0–46.0)
Lymphs Abs: 1.4 10*3/uL (ref 0.7–4.0)
MCHC: 34.1 g/dL (ref 30.0–36.0)
MCV: 94.2 fl (ref 78.0–100.0)
Monocytes Absolute: 0.3 10*3/uL (ref 0.1–1.0)
Monocytes Relative: 7.1 % (ref 3.0–12.0)
Neutro Abs: 2.5 10*3/uL (ref 1.4–7.7)
Neutrophils Relative %: 57.6 % (ref 43.0–77.0)
Platelets: 230 10*3/uL (ref 150.0–400.0)
RBC: 3.72 Mil/uL — ABNORMAL LOW (ref 3.87–5.11)
RDW: 12.8 % (ref 11.5–15.5)
WBC: 4.3 10*3/uL (ref 4.0–10.5)

## 2022-01-19 LAB — LIPID PANEL
Cholesterol: 179 mg/dL (ref 0–200)
HDL: 70.8 mg/dL (ref >39.00)
LDL Cholesterol: 99 mg/dL (ref 0–99)
NonHDL: 108.4
Total CHOL/HDL Ratio: 3
Triglycerides: 48 mg/dL (ref 0.0–149.0)
VLDL: 9.6 mg/dL (ref 0.0–40.0)

## 2022-01-19 LAB — TSH: TSH: 1.22 u[IU]/mL (ref 0.35–5.50)

## 2022-01-20 LAB — HEPATITIS C ANTIBODY: Hepatitis C Ab: NONREACTIVE

## 2022-02-10 ENCOUNTER — Encounter (HOSPITAL_BASED_OUTPATIENT_CLINIC_OR_DEPARTMENT_OTHER): Payer: Self-pay | Admitting: Obstetrics & Gynecology

## 2022-02-10 ENCOUNTER — Ambulatory Visit (HOSPITAL_BASED_OUTPATIENT_CLINIC_OR_DEPARTMENT_OTHER): Payer: 59 | Admitting: Obstetrics & Gynecology

## 2022-02-10 ENCOUNTER — Other Ambulatory Visit (HOSPITAL_BASED_OUTPATIENT_CLINIC_OR_DEPARTMENT_OTHER): Payer: Self-pay

## 2022-02-10 VITALS — BP 124/73 | HR 70 | Ht 63.5 in | Wt 142.6 lb

## 2022-02-10 DIAGNOSIS — N764 Abscess of vulva: Secondary | ICD-10-CM | POA: Diagnosis not present

## 2022-02-10 MED ORDER — SULFAMETHOXAZOLE-TRIMETHOPRIM 800-160 MG PO TABS
1.0000 | ORAL_TABLET | Freq: Two times a day (BID) | ORAL | 0 refills | Status: DC
  Filled 2022-02-10: qty 14, 7d supply, fill #0

## 2022-02-13 NOTE — Progress Notes (Signed)
GYNECOLOGY  VISIT  CC:   tender, draining lesion near clitoris   HPI: 51 y.o. [id number] Married White or Caucasian female here for complaint of tender and enlarged area near clitoris.  Reports area became quite hard and extremely tender to the touch until this morning when it started draining.  Reports pressure is better now as it pain.  Feels like there is odor.  Denies fever.  Tenderness has improved as well.   Past Medical History:  Diagnosis Date   Anxiety    Depression    GERD (gastroesophageal reflux disease)    History of back surgery 12/18/2018   IBS (irritable bowel syndrome)     MEDS:   Current Outpatient Medications on File Prior to Visit  Medication Sig Dispense Refill   clobetasol ointment (TEMOVATE) 0.05 % Apply topically to affected skin nightly for 4 weeks.  Then use use topically twice weekly until next appt. 30 g 2   clonazePAM (KLONOPIN) 0.5 MG tablet Take 1 tablet (0.5 mg total) by mouth 2 (two) times daily as needed. for anxiety 60 tablet 2   Ibuprofen-Acetaminophen (ADVIL DUAL ACTION PO) Take 1 tablet by mouth as needed.     NONFORMULARY OR COMPOUNDED ITEM Estradiol 0.25 / testosterone 0.'25mg'$ /ml intravaginal cream.  61m pv 2 to 3 times weekly.  Dispense 3 month supply/1RF 1 each 1   progesterone (PROMETRIUM) 100 MG capsule Take 1-2 capsules nightly as needed. 180 capsule 1   VITAMIN D PO Take by mouth daily in the afternoon.     No current facility-administered medications on file prior to visit.    ALLERGIES: Ciprofloxacin  SH:  married,  non smoker  Review of Systems  Constitutional: Negative.     PHYSICAL EXAMINATION:    BP 124/73 (BP Location: Left Arm, Patient Position: Sitting, Cuff Size: Large)   Pulse 70   Ht 5' 3.5" (1.613 m) Comment: Reported  Wt 142 lb 9.6 oz (64.7 kg)   LMP  (LMP Unknown)   BMI 24.86 kg/m     General appearance: alert, cooperative and appears stated age Lymph:  no inguinal LAD noted  Pelvic: External genitalia:   erythematous and mildly enlarged clitoris with drainage to the left.  Lesion milked with purulent drainage present              Urethra:  normal appearing urethra with no masses, tenderness or lesions              Bartholins and Skenes: normal                   Assessment/Plan: 1. Vulvar abscess - will start abx and obtain culture.  Warm compressed/warm baths discussed as well. - sulfamethoxazole-trimethoprim (BACTRIM DS) 800-160 MG tablet; Take 1 tablet by mouth 2 (two) times daily.  Dispense: 14 tablet; Refill: 0 - WOUND CULTURE

## 2022-02-14 LAB — WOUND CULTURE

## 2022-02-15 ENCOUNTER — Encounter (HOSPITAL_BASED_OUTPATIENT_CLINIC_OR_DEPARTMENT_OTHER): Payer: Self-pay | Admitting: Obstetrics & Gynecology

## 2022-02-16 MED ORDER — HYDROXYZINE HCL 10 MG PO TABS: 10.0000 mg | ORAL_TABLET | Freq: Three times a day (TID) | ORAL | 0 refills | Status: DC | PRN

## 2022-02-16 MED ORDER — PENICILLIN V POTASSIUM 500 MG PO TABS: 500.0000 mg | ORAL_TABLET | Freq: Four times a day (QID) | ORAL | 0 refills | Status: AC

## 2022-02-16 MED ORDER — FLUCONAZOLE 150 MG PO TABS: 150.0000 mg | ORAL_TABLET | Freq: Once | ORAL | 0 refills | Status: AC

## 2022-02-16 NOTE — Addendum Note (Signed)
Addended by: Megan Salon on: 02/16/2022 04:00 PM   Modules accepted: Orders

## 2022-02-19 ENCOUNTER — Other Ambulatory Visit (HOSPITAL_BASED_OUTPATIENT_CLINIC_OR_DEPARTMENT_OTHER): Payer: 59

## 2022-02-19 ENCOUNTER — Other Ambulatory Visit (HOSPITAL_BASED_OUTPATIENT_CLINIC_OR_DEPARTMENT_OTHER): Payer: Self-pay

## 2022-02-19 ENCOUNTER — Ambulatory Visit (HOSPITAL_BASED_OUTPATIENT_CLINIC_OR_DEPARTMENT_OTHER): Payer: 59 | Admitting: Obstetrics & Gynecology

## 2022-02-19 ENCOUNTER — Encounter (HOSPITAL_BASED_OUTPATIENT_CLINIC_OR_DEPARTMENT_OTHER): Payer: Self-pay | Admitting: Obstetrics & Gynecology

## 2022-02-19 VITALS — BP 103/76 | HR 85 | Ht 63.75 in | Wt 142.0 lb

## 2022-02-19 DIAGNOSIS — N764 Abscess of vulva: Secondary | ICD-10-CM

## 2022-02-19 DIAGNOSIS — L900 Lichen sclerosus et atrophicus: Secondary | ICD-10-CM | POA: Diagnosis not present

## 2022-02-19 DIAGNOSIS — B3731 Acute candidiasis of vulva and vagina: Secondary | ICD-10-CM | POA: Diagnosis not present

## 2022-02-19 DIAGNOSIS — R6882 Decreased libido: Secondary | ICD-10-CM

## 2022-02-19 DIAGNOSIS — Z23 Encounter for immunization: Secondary | ICD-10-CM

## 2022-02-19 MED ORDER — MOMETASONE FUROATE 0.1 % EX OINT
TOPICAL_OINTMENT | Freq: Every day | CUTANEOUS | 0 refills | Status: DC
  Filled 2022-02-19: qty 45, 30d supply, fill #0

## 2022-02-19 MED ORDER — FLUCONAZOLE 150 MG PO TABS
150.0000 mg | ORAL_TABLET | Freq: Once | ORAL | 0 refills | Status: AC
Start: 2022-02-19 — End: 2022-02-21
  Filled 2022-02-19: qty 2, 2d supply, fill #0

## 2022-02-19 MED ORDER — MOMETASONE FUROATE 0.1 % EX OINT: TOPICAL_OINTMENT | Freq: Every day | CUTANEOUS | 0 refills | Status: DC

## 2022-02-19 NOTE — Progress Notes (Unsigned)
GYNECOLOGY  VISIT  CC:   follow up, clitoral abscess   HPI: 51 y.o. [id number] Married White or Caucasian female here for recheck of clitoral abscess.  Pt initially started on antibiotics, Bactrim, and culture obtained.  This showed enterococcus and antibiotics changed to PCN.  Pt reports in last couple of days since starting the PCN, area is much, much better.  Firmness is resolved as well as tenderness.  Having minimal drainage now.  Was given rx for diflucan as well as PCN can often cause yeast vaginitis.  Husband had trouble finding this prescription.  I spoke with her personally about this but she couldn't remember exactly what this was for so I gave instructions again today.  Lastly, she does have lichen sclerosus and has been using clobetasol because her skin appearance was more significant at [facility] in July.  Has stopped the last two weeks with these other issues.  Not sure if needs to continue at this point.  Has not been using testosterone as prescribed for libido.  Was to have follow up lab work today.  Pt will plan to restart once she is finished with antibiotics and plan to check testosterone lab work in 2 months.  She will let me know when ready for this.  Lastly, want flu shot today.   Past Medical History:  Diagnosis Date   Anxiety    Depression    GERD (gastroesophageal reflux disease)    History of back surgery 12/18/2018   IBS (irritable bowel syndrome)     MEDS:   Current Outpatient Medications on File Prior to Visit  Medication Sig Dispense Refill   clobetasol ointment (TEMOVATE) 0.05 % Apply topically to affected skin nightly for 4 weeks.  Then use use topically twice weekly until next appt. 30 g 2   clonazePAM (KLONOPIN) 0.5 MG tablet Take 1 tablet (0.5 mg total) by mouth 2 (two) times daily as needed. for anxiety 60 tablet 2   hydrOXYzine (ATARAX) 10 MG tablet Take 1 tablet (10 mg total) by mouth 3 (three) times daily as needed for itching. 15 tablet 0    Ibuprofen-Acetaminophen (ADVIL DUAL ACTION PO) Take 1 tablet by mouth as needed.     NONFORMULARY OR COMPOUNDED ITEM Estradiol 0.25 / testosterone 0.'25mg'$ /ml intravaginal cream.  30m pv 2 to 3 times weekly.  Dispense 3 month supply/1RF 1 each 1   penicillin v potassium (VEETID) 500 MG tablet Take 1 tablet (500 mg total) by mouth 4 (four) times daily for 7 days. 28 tablet 0   progesterone (PROMETRIUM) 100 MG capsule Take 1-2 capsules nightly as needed. 180 capsule 1   VITAMIN D PO Take by mouth daily in the afternoon.     No current facility-administered medications on file prior to visit.    ALLERGIES: Ciprofloxacin  SH:  married, non smoker  Review of Systems  Constitutional: Negative.   Genitourinary:        Small amount of drainage from abscess    PHYSICAL EXAMINATION:    BP 103/76 (BP Location: Right Arm, Patient Position: Sitting, Cuff Size: Normal)   Pulse 85   Ht 5' 3.75" (1.619 m) Comment: Reported  Wt 142 lb (64.4 kg)   LMP  (LMP Unknown)   BMI 24.57 kg/m     Physical Exam Constitutional:      Appearance: Normal appearance.  Genitourinary:   Neurological:     General: No focal deficit present.     Mental Status: She is alert.  Assessment/Plan: 1. Vulvar abscess - much improved.  Advised to let me know if does not fully resolve with complete healing and complete resolution of drainage.  2. Lichen sclerosus - will transition from clobetasol to mometasone to be used topically twice weekly for maintenance to area indicated above on picture.  Pt aware of exactly where to place steroid ointment. - mometasone (ELOCON) 0.1 % ointment; Apply topically daily. Apply topically to affected area twice weekly.  Dispense: 45 g; Refill: 0  3. Yeast vaginitis - rx sent in as never got filled due to pharmacy being out of stock - fluconazole (DIFLUCAN) 150 MG tablet; Take 1 tablet (150 mg total) by mouth once for 1 dose. Repeat in 72 hours if symptoms are not completely  resolved.  Dispense: 2 tablet; Refill: 0  4. Influenza vaccination given - Flu Vaccine QUAD 36+ mos IM (Fluarix, Quad PF)   5.  Decreased libido - pt will restart in next week or so and return for testosterone level in two months.  She will call for lab appt.

## 2022-02-20 ENCOUNTER — Encounter (HOSPITAL_BASED_OUTPATIENT_CLINIC_OR_DEPARTMENT_OTHER): Payer: Self-pay | Admitting: Obstetrics & Gynecology

## 2022-02-20 DIAGNOSIS — R6882 Decreased libido: Secondary | ICD-10-CM | POA: Insufficient documentation

## 2022-02-20 DIAGNOSIS — L900 Lichen sclerosus et atrophicus: Secondary | ICD-10-CM | POA: Insufficient documentation

## 2022-02-22 ENCOUNTER — Other Ambulatory Visit (HOSPITAL_BASED_OUTPATIENT_CLINIC_OR_DEPARTMENT_OTHER): Payer: Self-pay

## 2022-03-16 ENCOUNTER — Other Ambulatory Visit: Payer: Self-pay | Admitting: Physician Assistant

## 2022-03-16 ENCOUNTER — Telehealth: Payer: Self-pay

## 2022-03-16 MED ORDER — CLONAZEPAM 1 MG PO TABS: 0.5000 mg | ORAL_TABLET | Freq: Two times a day (BID) | ORAL | 1 refills | Status: DC | PRN

## 2022-03-16 NOTE — Telephone Encounter (Signed)
Received from [facility] that pt Clonazepam 0.'5mg'$  is on back order and they are requesting we send in an alternative for patient.

## 2022-05-19 ENCOUNTER — Other Ambulatory Visit: Payer: Self-pay | Admitting: Physician Assistant

## 2022-05-19 NOTE — Telephone Encounter (Signed)
Last OV: 01/06/22  Next OV: 07/09/22  Last filled: 03/16/22  Quantity: 30 w/ 1 refill

## 2022-07-01 ENCOUNTER — Other Ambulatory Visit: Payer: Self-pay | Admitting: Physical Medicine and Rehabilitation

## 2022-07-01 DIAGNOSIS — Z1231 Encounter for screening mammogram for malignant neoplasm of breast: Secondary | ICD-10-CM

## 2022-07-09 ENCOUNTER — Ambulatory Visit: Payer: 59 | Admitting: Physician Assistant

## 2022-07-09 ENCOUNTER — Encounter: Payer: Self-pay | Admitting: Physician Assistant

## 2022-07-09 VITALS — BP 97/68 | HR 87 | Temp 98.2°F | Ht 63.0 in | Wt 141.0 lb

## 2022-07-09 DIAGNOSIS — R0789 Other chest pain: Secondary | ICD-10-CM

## 2022-07-09 DIAGNOSIS — Z87891 Personal history of nicotine dependence: Secondary | ICD-10-CM

## 2022-07-09 DIAGNOSIS — F411 Generalized anxiety disorder: Secondary | ICD-10-CM

## 2022-07-09 MED ORDER — DICLOFENAC SODIUM 75 MG PO TBEC: 75.0000 mg | DELAYED_RELEASE_TABLET | Freq: Two times a day (BID) | ORAL | 2 refills | Status: AC

## 2022-07-09 NOTE — Assessment & Plan Note (Signed)
Smoking cessation about 9-10 years ago per patient. She is interested in CT lung cancer screening after tax season is over. Will discuss at next visit.

## 2022-07-09 NOTE — Assessment & Plan Note (Signed)
PDMP reviewed today, no red flags, filling appropriately.  Stable taking Clonazepam 0.5 mg BID prn. Will call for refills.

## 2022-07-09 NOTE — Assessment & Plan Note (Signed)
Will update CXR to r/o any abnormal etiology there. Diclofenac 75 mg BID prn; take with food. Pt aware of risks vs benefits and possible adverse reactions. Consider PT.

## 2022-07-09 NOTE — Progress Notes (Signed)
Subjective:    Patient ID: [name] [name], female    DOB: 01-20-1971, 52 y.o.   MRN: [id number]  Chief Complaint  Patient presents with   Follow-up   Medication Refill   Back Pain    Pt states diclofenac sodium 57m was given to her years ago when pt had lower back surgery. Pt states she would like that prescribed due to back pain she's been havinf     Medication Refill  Back Pain   Patient is in today for follow-up. Recently married, doing well, still working full-time. Needing refill on clonazepam. No issues with this medication.   Also says she's having left upper back pain. On /off the last three years. MRI 04/13/21 was normal. Diclofenac sodium 75 mg seems to help, requesting refill    Past Medical History:  Diagnosis Date   Anxiety    Depression    GERD (gastroesophageal reflux disease)    History of back surgery 12/18/2018   IBS (irritable bowel syndrome)    Lichen sclerosus     Past Surgical History:  Procedure Laterality Date   AUGMENTATION MAMMAPLASTY Bilateral 03/2017   BACK SURGERY Bilateral 12/18/2018   BILATERAL SALPINGECTOMY Bilateral 10/31/2012   Procedure: BILATERAL SALPINGECTOMY;  Surgeon: MLyman Speller MD;  [facility];  Service: Gynecology;  Laterality: Bilateral;   BREAST ENHANCEMENT SURGERY  04/12/2017   CESAREAN SECTION     CYSTO WITH HYDRODISTENSION N/A 10/31/2012   Procedure: CYSTOSCOPY/HYDRODISTENSION;  Surgeon: SReece Packer MD;  [facility];  Service: Urology;  Laterality: N/A;   DILATION AND CURETTAGE OF UTERUS     DILITATION & CURRETTAGE/HYSTROSCOPY WITH NOVASURE ABLATION N/A 10/31/2012   Procedure: DILATATION & CURETTAGE/HYSTEROSCOPY WITH NOVASURE ABLATION;  Surgeon: MLyman Speller MD;  [facility];  Service: Gynecology;  Laterality: N/A;  Dr MSuzanne BoronDiarmid to start case with a cysto at beginning of case while patient under anesthesia.   ENDOMETRIAL BIOPSY  09/19/12   neg   LAPAROSCOPY Bilateral 10/31/2012    Procedure: LAPAROSCOPY OPERATIVE;  Surgeon: MLyman Speller MD;  [facility];  Service: Gynecology;  Laterality: Bilateral;   LEFT HEART CATH AND CORONARY ANGIOGRAPHY N/A 04/28/2021   Procedure: LEFT HEART CATH AND CORONARY ANGIOGRAPHY;  Surgeon: JMartinique Peter M, MD;  [facility];  Service: Cardiovascular;  Laterality: N/A;    Family History  Problem Relation Age of Onset   Stroke Father    Non-Hodgkin's lymphoma Father    Hypertension Father    Kidney disease Father    Hypertension Mother    Club foot Son     Social History   Tobacco Use   Smoking status: Former    Types: Cigarettes    Quit date: 01/15/2013    Years since quitting: 9.4   Smokeless tobacco: Never  Vaping Use   Vaping Use: Former   Quit date: 07/18/2018  Substance Use Topics   Alcohol use: Yes    Alcohol/week: 5.0 standard drinks of alcohol    Types: 5 Standard drinks or equivalent per week   Drug use: Yes    Types: Barbituates     Allergies  Allergen Reactions   Ciprofloxacin Nausea Only    Review of Systems  Musculoskeletal:  Positive for back pain.   NEGATIVE UNLESS OTHERWISE INDICATED IN HPI      Objective:     BP 97/68 (BP Location: Right Arm, Patient Position: Sitting)   Pulse 87   Temp 98.2 F (36.8 C) (  Temporal)   Ht 5' 3"$  (1.6 m)   Wt 141 lb (64 kg)   LMP  (LMP Unknown)   SpO2 96%   BMI 24.98 kg/m   Wt Readings from Last 3 Encounters:  07/09/22 141 lb (64 kg)  02/19/22 142 lb (64.4 kg)  02/10/22 142 lb 9.6 oz (64.7 kg)    BP Readings from Last 3 Encounters:  07/09/22 97/68  02/19/22 103/76  02/10/22 124/73     Physical Exam Vitals and nursing note reviewed.  Constitutional:      Appearance: Normal appearance.  Cardiovascular:     Rate and Rhythm: Normal rate and regular rhythm.  Pulmonary:     Effort: Pulmonary effort is normal.     Breath sounds: Normal breath sounds.  Musculoskeletal:       Arms:  Neurological:     General: No  focal deficit present.     Mental Status: She is alert.  Psychiatric:        Mood and Affect: Mood normal.        Behavior: Behavior normal.        Assessment & Plan:  GAD (generalized anxiety disorder) Assessment & Plan: PDMP reviewed today, no red flags, filling appropriately.  Stable taking Clonazepam 0.5 mg BID prn. Will call for refills.    Smoking history Assessment & Plan: Smoking cessation about 9-10 years ago per patient. She is interested in CT lung cancer screening after tax season is over. Will discuss at next visit.   Orders: -     DG Chest 2 View; Future  Left-sided chest wall pain Assessment & Plan: Will update CXR to r/o any abnormal etiology there. Diclofenac 75 mg BID prn; take with food. Pt aware of risks vs benefits and possible adverse reactions. Consider PT.   Orders: -     DG Chest 2 View; Future  Other orders -     Diclofenac Sodium; Take 1 tablet (75 mg total) by mouth 2 (two) times daily.  Dispense: 60 tablet; Refill: 2        Return in about 6 months (around 01/07/2023) for med recheck .     Elliette Seabolt M Davonda Ausley, PA-C

## 2022-08-17 ENCOUNTER — Ambulatory Visit
Admission: RE | Admit: 2022-08-17 | Discharge: 2022-08-17 | Disposition: A | Discharge status: Home / Self Care | Payer: 59 | Source: Ambulatory Visit | Attending: Physical Medicine and Rehabilitation | Admitting: Physical Medicine and Rehabilitation

## 2022-08-17 DIAGNOSIS — Z1231 Encounter for screening mammogram for malignant neoplasm of breast: Secondary | ICD-10-CM

## 2022-08-21 ENCOUNTER — Other Ambulatory Visit: Payer: Self-pay | Admitting: Physician Assistant

## 2022-08-23 NOTE — Telephone Encounter (Signed)
Last OV:  07/09/22  Next OV: 01/05/23  Last Filled: 05/19/22  Quantity: 30 w/ 2 refills

## 2022-11-29 ENCOUNTER — Other Ambulatory Visit: Payer: Self-pay | Admitting: Physician Assistant

## 2022-11-29 NOTE — Telephone Encounter (Signed)
Last OV: 07/09/22  Next OV: 01/05/23  Last Filled: 08/23/22  Quantity: 30 w/ 2 refills

## 2022-12-13 ENCOUNTER — Encounter (HOSPITAL_BASED_OUTPATIENT_CLINIC_OR_DEPARTMENT_OTHER): Payer: Self-pay | Admitting: Obstetrics & Gynecology

## 2022-12-13 ENCOUNTER — Ambulatory Visit (HOSPITAL_BASED_OUTPATIENT_CLINIC_OR_DEPARTMENT_OTHER): Payer: 59 | Admitting: Obstetrics & Gynecology

## 2022-12-13 ENCOUNTER — Other Ambulatory Visit (HOSPITAL_COMMUNITY)
Admission: RE | Admit: 2022-12-13 | Discharge: 2022-12-13 | Disposition: A | Discharge status: Home / Self Care | Payer: 59 | Source: Ambulatory Visit | Attending: Obstetrics & Gynecology | Admitting: Obstetrics & Gynecology

## 2022-12-13 ENCOUNTER — Other Ambulatory Visit (HOSPITAL_BASED_OUTPATIENT_CLINIC_OR_DEPARTMENT_OTHER): Payer: Self-pay

## 2022-12-13 VITALS — BP 115/76 | HR 74 | Ht 64.0 in | Wt 145.0 lb

## 2022-12-13 DIAGNOSIS — L900 Lichen sclerosus et atrophicus: Secondary | ICD-10-CM

## 2022-12-13 DIAGNOSIS — Z23 Encounter for immunization: Secondary | ICD-10-CM

## 2022-12-13 DIAGNOSIS — N951 Menopausal and female climacteric states: Secondary | ICD-10-CM | POA: Diagnosis not present

## 2022-12-13 DIAGNOSIS — R6882 Decreased libido: Secondary | ICD-10-CM

## 2022-12-13 DIAGNOSIS — Z124 Encounter for screening for malignant neoplasm of cervix: Secondary | ICD-10-CM | POA: Diagnosis present

## 2022-12-13 DIAGNOSIS — Z01419 Encounter for gynecological examination (general) (routine) without abnormal findings: Secondary | ICD-10-CM | POA: Diagnosis not present

## 2022-12-13 MED ORDER — PROGESTERONE MICRONIZED 100 MG PO CAPS
ORAL_CAPSULE | ORAL | 4 refills | Status: DC
Start: 2022-12-13 — End: 2023-02-15
  Filled 2022-12-13: qty 60, 30d supply, fill #0

## 2022-12-13 NOTE — Progress Notes (Signed)
52 y.o. [id number] Married White or Caucasian female here for annual exam.  Reports she has use testosterone pellets this past year.  Felt this really didn't do anything.  Would like me to manage this.  Discussed I do not use pellets but topical options reviewed.  She would like to try.  Taking progesterone 100mg  nightly.    Denies vaginal bleeding.  No LMP recorded (lmp unknown). Patient is postmenopausal.          Sexually active: Yes.    The current method of family planning is post menopausal status.    Smoker:  no  Health Maintenance: Pap:  06/15/2019 Negative History of abnormal Pap:  h/o ascus with +HR HPV MMG:  08/17/2022 Negative Colonoscopy:  01/24/2019, follow up 10 years Screening Labs: did hormonal blood work on 5/30   reports that she quit smoking about 9 years ago. Her smoking use included cigarettes. She has never used smokeless tobacco. She reports current alcohol use of about 5.0 standard drinks of alcohol per week. She reports current drug use. Drug: Barbituates.  Past Medical History:  Diagnosis Date   Anxiety    Depression    GERD (gastroesophageal reflux disease)    History of back surgery 12/18/2018   IBS (irritable bowel syndrome)    Lichen sclerosus     Past Surgical History:  Procedure Laterality Date   AUGMENTATION MAMMAPLASTY Bilateral 03/2017   BACK SURGERY Bilateral 12/18/2018   BILATERAL SALPINGECTOMY Bilateral 10/31/2012   Procedure: BILATERAL SALPINGECTOMY;  Surgeon: Annamaria Boots, MD;  [facility];  Service: Gynecology;  Laterality: Bilateral;   BREAST ENHANCEMENT SURGERY  04/12/2017   CESAREAN SECTION     CYSTO WITH HYDRODISTENSION N/A 10/31/2012   Procedure: CYSTOSCOPY/HYDRODISTENSION;  Surgeon: Martina Sinner, MD;  Location: [facility];  Service: Urology;  Laterality: N/A;   DILATION AND CURETTAGE OF UTERUS     DILITATION & CURRETTAGE/HYSTROSCOPY WITH NOVASURE ABLATION N/A 10/31/2012   Procedure: DILATATION & CURETTAGE/HYSTEROSCOPY WITH  NOVASURE ABLATION;  Surgeon: Annamaria Boots, MD;  Location[facility];  Service: Gynecology;  Laterality: N/A;  Dr Gildardo Griffes Diarmid to start case with a cysto at beginning of case while patient under anesthesia.   ENDOMETRIAL BIOPSY  09/19/12   neg   LAPAROSCOPY Bilateral 10/31/2012   Procedure: LAPAROSCOPY OPERATIVE;  Surgeon: Annamaria Boots, MD;  Location: [facility];  Service: Gynecology;  Laterality: Bilateral;   LEFT HEART CATH AND CORONARY ANGIOGRAPHY N/A 04/28/2021   Procedure: LEFT HEART CATH AND CORONARY ANGIOGRAPHY;  Surgeon: [place], Peter M, MD;  [facility];  Service: Cardiovascular;  Laterality: N/A;    Current Outpatient Medications  Medication Sig Dispense Refill   clobetasol ointment (TEMOVATE) 0.05 % Apply topically to affected skin nightly for 4 weeks.  Then use use topically twice weekly until next appt. 30 g 2   clonazePAM (KLONOPIN) 0.5 MG tablet Take 1 tablet (0.5 mg total) by mouth 2 (two) times daily as needed. for anxiety 60 tablet 2   clonazePAM (KLONOPIN) 1 MG tablet TAKE 1/2 TABLET(0.5 MG) BY MOUTH TWICE DAILY AS NEEDED FOR ANXIETY 30 tablet 2   hydrOXYzine (ATARAX) 10 MG tablet Take 1 tablet (10 mg total) by mouth 3 (three) times daily as needed for itching. 15 tablet 0   Ibuprofen-Acetaminophen (ADVIL DUAL ACTION PO) Take 1 tablet by mouth as needed.     mometasone (ELOCON) 0.1 % ointment Apply topically daily. Apply topically to affected area twice weekly. 45 g 0  NONFORMULARY OR COMPOUNDED ITEM Estradiol 0.25 / testosterone 0.25mg /ml intravaginal cream.  1ml pv 2 to 3 times weekly.  Dispense 3 month supply/1RF 1 each 1   phentermine (ADIPEX-P) 37.5 MG tablet Take by mouth.     progesterone (PROMETRIUM) 100 MG capsule Take 1-2 capsules nightly as needed. 180 capsule 1   VITAMIN D PO Take by mouth daily in the afternoon.     No current facility-administered medications for this visit.    Family History  Problem Relation Age of Onset   Stroke  Father    Non-Hodgkin's lymphoma Father    Hypertension Father    Kidney disease Father    Hypertension Mother    Club foot Son     ROS: Constitutional: negative Genitourinary:negative  Exam:   BP 115/76 (BP Location: Right Arm, Patient Position: Sitting, Cuff Size: Large)   Pulse 74   Ht 5\' 4"  (1.626 m) Comment: Reported  Wt 145 lb (65.8 kg)   LMP  (LMP Unknown)   BMI 24.89 kg/m   Height: 5\' 4"  (162.6 cm) (Reported)  General appearance: alert, cooperative and appears stated age Head: Normocephalic, without obvious abnormality, atraumatic Neck: no adenopathy, supple, symmetrical, trachea midline and thyroid normal to inspection and palpation Lungs: clear to auscultation bilaterally Breasts: normal appearance, no masses or tenderness Heart: regular rate and rhythm Abdomen: soft, non-tender; bowel sounds normal; no masses,  no organomegaly Extremities: extremities normal, atraumatic, no cyanosis or edema Skin: Skin color, texture, turgor normal. No rashes or lesions Lymph nodes: Cervical, supraclavicular, and axillary nodes normal. No abnormal inguinal nodes palpated Neurologic: Grossly normal   Pelvic: External genitalia:  no lesions, small amount of hypopigmentation around inner labia minora with loss of distinction between labia minora and majora, no ulcerations              Urethra:  normal appearing urethra with no masses, tenderness or lesions              Bartholins and Skenes: normal                 Vagina: normal appearing vagina with normal color and no discharge, no lesions              Cervix: no lesions              Pap taken: Yes.   Bimanual Exam:  Uterus:  normal size, contour, position, consistency, mobility, non-tender              Adnexa: normal adnexa and no mass, fullness, tenderness               Rectovaginal: Confirms               Anus:  normal sphincter tone, no lesions  Chaperone, Ina Homes, CMA, was present for exam.  Assessment/Plan: 1.  Well woman exam with routine gynecological exam - Pap smear obtained today - Mammogram 07/2022 - Colonoscopy 2020 follow up 10 years - Bone mineral density not indicated - lab work done not ordered - vaccines reviewed/updated  2. Cervical cancer screening - Cytology - PAP  3. Menopausal symptoms - progesterone (PROMETRIUM) 100 MG capsule; Take 1-2 capsules nightly as needed.  Dispense: 90 capsule; Refill: 4  4. Lichen sclerosus - does not need mometasone RF or clobetasol RF.   5. Decreased libido - NONFORMULARY OR COMPOUNDED ITEM; Topical testosterone 2% cream. 84mcg/ml. Apply 1 ml topically to skin Monday, Wednesday, and Friday each week.  Dispense 3 months  supply.  #1 RF.  Dispense: 1 each; Refill: 1

## 2022-12-15 ENCOUNTER — Encounter (HOSPITAL_BASED_OUTPATIENT_CLINIC_OR_DEPARTMENT_OTHER): Payer: Self-pay | Admitting: Obstetrics & Gynecology

## 2022-12-15 LAB — CYTOLOGY - PAP
Adequacy: ABSENT
Comment: NEGATIVE
Diagnosis: NEGATIVE
High risk HPV: NEGATIVE

## 2022-12-19 MED ORDER — NONFORMULARY OR COMPOUNDED ITEM
1 refills | Status: DC
Start: 2022-12-19 — End: 2022-12-20

## 2022-12-20 ENCOUNTER — Other Ambulatory Visit (HOSPITAL_BASED_OUTPATIENT_CLINIC_OR_DEPARTMENT_OTHER): Payer: Self-pay | Admitting: *Deleted

## 2022-12-20 ENCOUNTER — Other Ambulatory Visit (HOSPITAL_BASED_OUTPATIENT_CLINIC_OR_DEPARTMENT_OTHER): Payer: Self-pay | Admitting: Obstetrics & Gynecology

## 2022-12-20 DIAGNOSIS — R6882 Decreased libido: Secondary | ICD-10-CM

## 2022-12-20 MED ORDER — NONFORMULARY OR COMPOUNDED ITEM
1 refills | Status: DC
Start: 2022-12-20 — End: 2023-12-19

## 2022-12-20 MED ORDER — NONFORMULARY OR COMPOUNDED ITEM
1 refills | Status: DC
Start: 2022-12-20 — End: 2022-12-20

## 2022-12-20 NOTE — Addendum Note (Signed)
Addended by: Jerene Bears on: 12/20/2022 12:28 PM   Modules accepted: Orders

## 2022-12-22 ENCOUNTER — Other Ambulatory Visit (HOSPITAL_BASED_OUTPATIENT_CLINIC_OR_DEPARTMENT_OTHER): Payer: Self-pay

## 2023-01-05 ENCOUNTER — Ambulatory Visit: Payer: 59 | Admitting: Physician Assistant

## 2023-01-05 ENCOUNTER — Encounter: Payer: Self-pay | Admitting: Physician Assistant

## 2023-01-05 VITALS — BP 118/70 | HR 76 | Temp 97.5°F | Wt 143.8 lb

## 2023-01-05 DIAGNOSIS — F419 Anxiety disorder, unspecified: Secondary | ICD-10-CM | POA: Insufficient documentation

## 2023-01-05 DIAGNOSIS — F32A Depression, unspecified: Secondary | ICD-10-CM

## 2023-01-05 DIAGNOSIS — R1012 Left upper quadrant pain: Secondary | ICD-10-CM | POA: Diagnosis not present

## 2023-01-05 DIAGNOSIS — R109 Unspecified abdominal pain: Secondary | ICD-10-CM

## 2023-01-05 DIAGNOSIS — Z87891 Personal history of nicotine dependence: Secondary | ICD-10-CM | POA: Diagnosis not present

## 2023-01-05 LAB — POC URINALSYSI DIPSTICK (AUTOMATED)
Bilirubin, UA: NEGATIVE
Blood, UA: 10
Glucose, UA: NEGATIVE
Ketones, UA: NEGATIVE
Leukocytes, UA: NEGATIVE
Nitrite, UA: NEGATIVE
Protein, UA: NEGATIVE
Spec Grav, UA: 1.015 (ref 1.010–1.025)
Urobilinogen, UA: 0.2 E.U./dL
pH, UA: 6 (ref 5.0–8.0)

## 2023-01-05 LAB — COMPREHENSIVE METABOLIC PANEL
ALT: 19 U/L (ref 0–35)
AST: 21 U/L (ref 0–37)
Albumin: 4.4 g/dL (ref 3.5–5.2)
Alkaline Phosphatase: 65 U/L (ref 39–117)
BUN: 13 mg/dL (ref 6–23)
CO2: 28 mEq/L (ref 19–32)
Calcium: 9.3 mg/dL (ref 8.4–10.5)
Chloride: 104 mEq/L (ref 96–112)
Creatinine, Ser: 0.7 mg/dL (ref 0.40–1.20)
GFR: 98.06 mL/min (ref >60.00)
Glucose, Bld: 80 mg/dL (ref 70–99)
Potassium: 4.1 mEq/L (ref 3.5–5.1)
Sodium: 139 mEq/L (ref 135–145)
Total Bilirubin: 0.7 mg/dL (ref 0.2–1.2)
Total Protein: 7.2 g/dL (ref 6.0–8.3)

## 2023-01-05 LAB — CBC WITH DIFFERENTIAL/PLATELET
Basophils Absolute: 0 10*3/uL (ref 0.0–0.1)
Basophils Relative: 0.7 % (ref 0.0–3.0)
Eosinophils Absolute: 0.1 10*3/uL (ref 0.0–0.7)
Eosinophils Relative: 1.9 % (ref 0.0–5.0)
HCT: 36.6 % (ref 36.0–46.0)
Hemoglobin: 12.3 g/dL (ref 12.0–15.0)
Lymphocytes Relative: 40.8 % (ref 12.0–46.0)
Lymphs Abs: 2.2 10*3/uL (ref 0.7–4.0)
MCHC: 33.5 g/dL (ref 30.0–36.0)
MCV: 95.4 fl (ref 78.0–100.0)
Monocytes Absolute: 0.5 10*3/uL (ref 0.1–1.0)
Monocytes Relative: 9 % (ref 3.0–12.0)
Neutro Abs: 2.6 10*3/uL (ref 1.4–7.7)
Neutrophils Relative %: 47.6 % (ref 43.0–77.0)
Platelets: 235 10*3/uL (ref 150.0–400.0)
RBC: 3.84 Mil/uL — ABNORMAL LOW (ref 3.87–5.11)
RDW: 12.4 % (ref 11.5–15.5)
WBC: 5.4 10*3/uL (ref 4.0–10.5)

## 2023-01-05 LAB — LIPASE: Lipase: 15 U/L (ref 11.0–59.0)

## 2023-01-05 MED ORDER — BUPROPION HCL ER (SR) 150 MG PO TB12
150.0000 mg | ORAL_TABLET | Freq: Every morning | ORAL | 0 refills | Status: DC
Start: 2023-01-05 — End: 2023-07-08

## 2023-01-05 NOTE — Assessment & Plan Note (Signed)
Klonopin 0.5 mg BID helps keep anxiety stable. She will call for refills. PDMP reviewed today, no red flags, filling appropriately.   Depression worsening recently. Motivation lacking. Will trial Wellbutrin SR 150 mg qAM and see how she's doing in the next few weeks. May increase to XL or BID SR dosing after then. Can also consider Trintellix or Pristiq.

## 2023-01-05 NOTE — Progress Notes (Signed)
Labs all reassuring.

## 2023-01-05 NOTE — Progress Notes (Signed)
Subjective:    Patient ID: [name] [name], female    DOB: 11-03-1970, 52 y.o.   MRN: [id number]  Chief Complaint  Patient presents with   Chest Pain    Left side intermittent more consent for couple weeks    Follow-up    Medication     Chest Pain    Patient is in today for 6 month f/up.  Feeling kind of 'blah' and unmotivated lately. More tearful. A lot going on with kids, not coping well right now. Frustrated at work, difficult to get going there every day.  She had previously taken Lexapro, but states this was really hard to come off of.  Has been more than a decade since she tried Wellbutrin, states that she took this for smoking cessation.  She does want to try to start back on something to help with her moods.  She is still taking Klonopin 0.5 mg twice daily.  Patient also discusses her left chest wall pain and back pain.  States she has been seeing a massage therapist and area where it has been hurting on her back has resolved.  States that she has a new pressure type pain just under her left rib.  It's become more persistent.  Massage does not seem to help.  Patient is worried because her dad had lymphoma.  Denies any change in bowel habits.  No other medical changes or symptoms she can think of.  Past Medical History:  Diagnosis Date   Anxiety    Depression    GERD (gastroesophageal reflux disease)    History of back surgery 12/18/2018   IBS (irritable bowel syndrome)    Lichen sclerosus     Past Surgical History:  Procedure Laterality Date   AUGMENTATION MAMMAPLASTY Bilateral 03/2017   BACK SURGERY Bilateral 12/18/2018   BILATERAL SALPINGECTOMY Bilateral 10/31/2012   Procedure: BILATERAL SALPINGECTOMY;  Surgeon: Annamaria Boots, MD;  Location: [facility];  Service: Gynecology;  Laterality: Bilateral;   BREAST ENHANCEMENT SURGERY  04/12/2017   CESAREAN SECTION     CYSTO WITH HYDRODISTENSION N/A 10/31/2012   Procedure: CYSTOSCOPY/HYDRODISTENSION;  Surgeon: Martina Sinner, MD;  Location: [facility];  Service: Urology;  Laterality: N/A;   DILATION AND CURETTAGE OF UTERUS     DILITATION & CURRETTAGE/HYSTROSCOPY WITH NOVASURE ABLATION N/A 10/31/2012   Procedure: DILATATION & CURETTAGE/HYSTEROSCOPY WITH NOVASURE ABLATION;  Surgeon: Annamaria Boots, MD;  Location: [facility];  Service: Gynecology;  Laterality: N/A;  Dr Gildardo Griffes Diarmid to start case with a cysto at beginning of case while patient under anesthesia.   ENDOMETRIAL BIOPSY  09/19/12   neg   LAPAROSCOPY Bilateral 10/31/2012   Procedure: LAPAROSCOPY OPERATIVE;  Surgeon: Annamaria Boots, MD;  Location: [facility];  Service: Gynecology;  Laterality: Bilateral;   LEFT HEART CATH AND CORONARY ANGIOGRAPHY N/A 04/28/2021   Procedure: LEFT HEART CATH AND CORONARY ANGIOGRAPHY;  Surgeon: Swazil[place], Peter M, MD;  Location: [facility] INVASIVE CV LAB;  Service: Cardiovascular;  Laterality: N/A;    Family History  Problem Relation Age of Onset   Stroke Father    Non-Hodgkin's lymphoma Father    Hypertension Father    Kidney disease Father    Hypertension Mother    Club foot Son     Social History   Tobacco Use   Smoking status: Former    Current packs/day: 0.00    Types: Cigarettes    Quit date: 01/15/2013    Years since quitting: 9.9   Smokeless tobacco: Never  Vaping Use   Vaping status: Former   Quit date: 07/18/2018  Substance Use Topics   Alcohol use: Yes    Alcohol/week: 5.0 standard drinks of alcohol    Types: 5 Standard drinks or equivalent per week   Drug use: Yes    Types: Barbituates     Allergies  Allergen Reactions   Ciprofloxacin Nausea Only    Review of Systems  Cardiovascular:  Positive for chest pain.   NEGATIVE UNLESS OTHERWISE INDICATED IN HPI      Objective:     BP 118/70   Pulse 76   Temp (!) 97.5 F (36.4 C) (Temporal)   Wt 143 lb 12.8 oz (65.2 kg)   LMP  (LMP Unknown)   SpO2 98%   BMI 24.68 kg/m   Wt Readings from Last 3 Encounters:  01/05/23 143 lb 12.8 oz (65.2 kg)   12/13/22 145 lb (65.8 kg)  07/09/22 141 lb (64 kg)    BP Readings from Last 3 Encounters:  01/05/23 118/70  12/13/22 115/76  07/09/22 97/68     Physical Exam Vitals and nursing note reviewed.  Constitutional:      General: She is not in acute distress.    Appearance: Normal appearance. She is well-developed. She is not ill-appearing.  HENT:     Head: Normocephalic and atraumatic.  Cardiovascular:     Rate and Rhythm: Normal rate and regular rhythm.     Pulses: Normal pulses.     Heart sounds: Normal heart sounds.  Pulmonary:     Effort: Pulmonary effort is normal.     Breath sounds: Normal breath sounds.  Abdominal:     General: Abdomen is flat. Bowel sounds are normal.     Palpations: Abdomen is soft.     Tenderness: There is abdominal tenderness in the left upper quadrant. There is left CVA tenderness (minimal). There is no right CVA tenderness.  Skin:    General: Skin is warm and dry.  Neurological:     General: No focal deficit present.     Mental Status: She is alert.  Psychiatric:        Mood and Affect: Mood is depressed.        Assessment & Plan:  Anxiety and depression Assessment & Plan: Klonopin 0.5 mg BID helps keep anxiety stable. She will call for refills. PDMP reviewed today, no red flags, filling appropriately.   Depression worsening recently. Motivation lacking. Will trial Wellbutrin SR 150 mg qAM and see how she's doing in the next few weeks. May increase to XL or BID SR dosing after then. Can also consider Trintellix or Pristiq.   Orders: -     buPROPion HCl ER (SR); Take 1 tablet (150 mg total) by mouth in the morning.  Dispense: 30 tablet; Refill: 0  Left upper quadrant pain -     CBC with Differential/Platelet -     Comprehensive metabolic panel -     Lipase -     CT ABDOMEN PELVIS W CONTRAST; Future  Left flank pain -     CBC with Differential/Platelet -     Comprehensive metabolic panel -     Lipase -     POCT Urinalysis Dipstick  (Automated) -     CT ABDOMEN PELVIS W CONTRAST; Future  Smoking history -     Ambulatory Referral for Lung Cancer Scre        Return in about 6 months (around 07/08/2023) for recheck/follow-up.  This note was prepared  with assistance of Conservation officer, historic buildings. Occasional wrong-word or sound-a-like substitutions may have occurred due to the inherent limitations of voice recognition software.   Racine Erby M Caroll Weinheimer, PA-C

## 2023-01-12 ENCOUNTER — Other Ambulatory Visit: Payer: 59

## 2023-02-02 ENCOUNTER — Ambulatory Visit
Admission: RE | Admit: 2023-02-02 | Discharge: 2023-02-02 | Disposition: A | Discharge status: Home / Self Care | Payer: 59 | Source: Ambulatory Visit | Attending: Physician Assistant | Admitting: Physician Assistant

## 2023-02-02 DIAGNOSIS — R1012 Left upper quadrant pain: Secondary | ICD-10-CM

## 2023-02-02 DIAGNOSIS — R109 Unspecified abdominal pain: Secondary | ICD-10-CM

## 2023-02-02 MED ORDER — IOPAMIDOL (ISOVUE-300) INJECTION 61%: 200.0000 mL | Freq: Once | INTRAVENOUS | Status: DC | PRN

## 2023-02-02 MED ORDER — IOPAMIDOL (ISOVUE-300) INJECTION 61%
100.0000 mL | Freq: Once | INTRAVENOUS | Status: AC | PRN
  Administered 2023-02-02: 100 mL via INTRAVENOUS

## 2023-02-15 ENCOUNTER — Other Ambulatory Visit (HOSPITAL_BASED_OUTPATIENT_CLINIC_OR_DEPARTMENT_OTHER): Payer: Self-pay | Admitting: Obstetrics & Gynecology

## 2023-02-15 DIAGNOSIS — N951 Menopausal and female climacteric states: Secondary | ICD-10-CM

## 2023-03-03 ENCOUNTER — Other Ambulatory Visit: Payer: Self-pay | Admitting: Physician Assistant

## 2023-03-03 NOTE — Telephone Encounter (Signed)
Last OV: 01/05/23  Next OV: 07/08/23  Last Filled: 11/29/22  Quantity: 30 w/ 2 refills

## 2023-03-16 ENCOUNTER — Encounter (HOSPITAL_BASED_OUTPATIENT_CLINIC_OR_DEPARTMENT_OTHER): Payer: Self-pay | Admitting: *Deleted

## 2023-03-21 ENCOUNTER — Other Ambulatory Visit (HOSPITAL_COMMUNITY)
Admission: RE | Admit: 2023-03-21 | Discharge: 2023-03-21 | Disposition: A | Discharge status: Home / Self Care | Payer: 59 | Source: Ambulatory Visit | Attending: Family Medicine | Admitting: Family Medicine

## 2023-03-21 ENCOUNTER — Ambulatory Visit: Payer: 59 | Admitting: Family Medicine

## 2023-03-21 VITALS — BP 110/82 | HR 90 | Temp 97.9°F | Ht 64.0 in | Wt 147.0 lb

## 2023-03-21 DIAGNOSIS — N898 Other specified noninflammatory disorders of vagina: Secondary | ICD-10-CM | POA: Diagnosis not present

## 2023-03-21 DIAGNOSIS — R350 Frequency of micturition: Secondary | ICD-10-CM

## 2023-03-21 DIAGNOSIS — Z23 Encounter for immunization: Secondary | ICD-10-CM | POA: Diagnosis not present

## 2023-03-21 LAB — POCT URINALYSIS DIPSTICK
Bilirubin, UA: NEGATIVE
Blood, UA: POSITIVE
Glucose, UA: NEGATIVE
Ketones, UA: NEGATIVE
Leukocytes, UA: NEGATIVE
Nitrite, UA: NEGATIVE
Protein, UA: NEGATIVE
Spec Grav, UA: 1.025 (ref 1.010–1.025)
Urobilinogen, UA: NEGATIVE U/dL — AB
pH, UA: 6 (ref 5.0–8.0)

## 2023-03-21 NOTE — Progress Notes (Signed)
Subjective   CC:  Chief Complaint  Patient presents with   Urinary Frequency    Frequent urination for the past 2 weeks and today has been the worst along with pressure     HPI: [name] [name] is a 52 y.o. female who presents to the office today to address the problems listed above in the chief complaint. Patient reports urinary frequency x 2 weeks, worse over the last 48 hours. .  She has sensation of increased urinary pressure.  She denies fevers flank pain nausea vomiting or gross hematuria.  + vaginal itching external w/o discharge. She is postmenopausal, drinks one cup of coffee daily and doesn't drink much water. Has been very busy over weekend and drank a caffeinated energy drink.  She denies history of interstitial cystitis.  She denies vaginal symptoms including pelvic pain.  Had + blood on dip (no microscopy to confirm) and normal CT scan; I reviewed notes and scan results.   Assessment  1. Frequent urination   2. Need for influenza vaccination   3. Vagina itching      Plan  Frequent urination: may be due to dehydration/concentrated urine. Neg dipstick. Await culture. Rec increasing water intake. Consider OAB if persists.  R/o yeast /bv with vaginal test/swab today.  Flu shot updated.  Follow up: as needed  Orders Placed This Encounter  Procedures   Urine Culture   Flu vaccine trivalent PF, 6mos and older(Flulaval,Afluria,Fluarix,Fluzone)   POCT Urinalysis Dipstick   No orders of the defined types were placed in this encounter.     I reviewed the patients updated PMH, FH, and SocHx.    Patient Active Problem List   Diagnosis Date Noted   Anxiety and depression 01/05/2023   Lichen sclerosus 02/20/2022   Decreased libido 02/20/2022   Left-sided chest wall pain 04/21/2021   GERD (gastroesophageal reflux disease) 04/21/2021   GAD (generalized anxiety disorder) 08/01/2018   Seizure (HCC) 05/17/2013   Slow transit constipation 08/12/2011   Seasonal allergic  rhinitis 08/12/2011   Smoking history 01/10/2011   HEMORRHOIDS 05/07/2010   Depression, recurrent (HCC) 04/17/2010   Current Meds  Medication Sig   buPROPion (WELLBUTRIN SR) 150 MG 12 hr tablet Take 1 tablet (150 mg total) by mouth in the morning.   clobetasol ointment (TEMOVATE) 0.05 % Apply topically to affected skin nightly for 4 weeks.  Then use use topically twice weekly until next appt. (Patient taking differently: Apply 1 Application topically as needed. Apply topically to affected skin nightly for 4 weeks.  Then use use topically twice weekly until next appt.)   clonazePAM (KLONOPIN) 0.5 MG tablet Take 1 tablet (0.5 mg total) by mouth 2 (two) times daily as needed. for anxiety   clonazePAM (KLONOPIN) 1 MG tablet TAKE 1/2 TABLET(0.5 MG) BY MOUTH TWICE DAILY AS NEEDED FOR ANXIETY   Ibuprofen-Acetaminophen (ADVIL DUAL ACTION PO) Take 1 tablet by mouth as needed.   NONFORMULARY OR COMPOUNDED ITEM Topical testosterone 2% cream. 42mcg/ml. Apply 1 ml topically to skin Monday, Wednesday, and Friday each week.  Dispense 3 months supply.  #1 RF.   progesterone (PROMETRIUM) 100 MG capsule TAKE 1 TO 2 CAPSULES BY MOUTH EVERY NIGHT AS NEEDED   VITAMIN D PO Take by mouth daily in the afternoon.    Review of Systems: Cardiovascular: negative for chest pain Respiratory: negative for SOB or persistent cough Gastrointestinal: negative for abdominal pain Constitutional: Negative for fever malaise or anorexia  Objective  Vitals: BP 110/82   Pulse 90  Temp 97.9 F (36.6 C)   Ht 5\' 4"  (1.626 m)   Wt 147 lb (66.7 kg)   LMP  (LMP Unknown)   SpO2 96%   BMI 25.23 kg/m  General: no acute distress  Psych:  Alert and oriented, normal mood and affect Cardiovascular:  RRR without murmur or gallop. no peripheral edema Respiratory:  Good breath sounds bilaterally, CTAB with normal respiratory effort Gastrointestinal: soft, flat abdomen, normal active bowel sounds, NO CVAT, no suprapubic ttp w/o  rebound or guarding Skin:  Warm, no rashes  Office Visit on 03/21/2023  Component Date Value Ref Range Status   Color, UA 03/21/2023 yellow   Final   Clarity, UA 03/21/2023 clear   Final   Glucose, UA 03/21/2023 Negative  Negative Final   Bilirubin, UA 03/21/2023 negative   Final   Ketones, UA 03/21/2023 negative   Final   Spec Grav, UA 03/21/2023 1.025  1.010 - 1.025 Final   Blood, UA 03/21/2023 positive   Final   pH, UA 03/21/2023 6.0  5.0 - 8.0 Final   Protein, UA 03/21/2023 Negative  Negative Final   Urobilinogen, UA 03/21/2023 negative (A)  0.2 or 1.0 E.U./dL Final   Nitrite, UA 16/03/9603 negative   Final   Leukocytes, UA 03/21/2023 Negative  Negative Final   Commons side effects, risks, benefits, and alternatives for medications and treatment plan prescribed today were discussed, and the patient expressed understanding of the given instructions. Patient is instructed to call or message via MyChart if he/she has any questions or concerns regarding our treatment plan. No barriers to understanding were identified. We discussed Red Flag symptoms and signs in detail. Patient expressed understanding regarding what to do in case of urgent or emergency type symptoms.  Medication list was reconciled, printed and provided to the patient in AVS. Patient instructions and summary information was reviewed with the patient as documented in the AVS. This note was prepared with assistance of Dragon voice recognition software. Occasional wrong-word or sound-a-like substitutions may have occurred due to the inherent limitations of voice recognition software

## 2023-03-23 LAB — CERVICOVAGINAL ANCILLARY ONLY
Bacterial Vaginitis (gardnerella): NEGATIVE
Candida Glabrata: NEGATIVE
Candida Vaginitis: NEGATIVE
Comment: NEGATIVE
Comment: NEGATIVE
Comment: NEGATIVE

## 2023-03-24 LAB — URINE CULTURE
MICRO NUMBER:: 15620613
SPECIMEN QUALITY:: ADEQUATE

## 2023-03-24 MED ORDER — NITROFURANTOIN MONOHYD MACRO 100 MG PO CAPS: 100.0000 mg | ORAL_CAPSULE | Freq: Two times a day (BID) | ORAL | 0 refills | Status: DC

## 2023-03-24 NOTE — Progress Notes (Signed)
Please call patient:urine result does show a low grade infection. I have sent in antibiotics to take for 5 days. Let us know if her symptoms do not resolve. Thanks!

## 2023-03-24 NOTE — Addendum Note (Signed)
Addended by: Asencion Partridge on: 03/24/2023 08:23 AM   Modules accepted: Orders

## 2023-06-02 ENCOUNTER — Other Ambulatory Visit: Payer: Self-pay | Admitting: Physician Assistant

## 2023-06-02 NOTE — Telephone Encounter (Signed)
 Last OV: 01/05/23  Next OV: 07/08/23  Last Filled: 03/03/23  Quantity: 30 w/ 2 refills

## 2023-07-08 ENCOUNTER — Ambulatory Visit (INDEPENDENT_AMBULATORY_CARE_PROVIDER_SITE_OTHER): Payer: 59 | Admitting: Physician Assistant

## 2023-07-08 ENCOUNTER — Encounter (HOSPITAL_BASED_OUTPATIENT_CLINIC_OR_DEPARTMENT_OTHER): Payer: Self-pay | Admitting: Obstetrics & Gynecology

## 2023-07-08 VITALS — BP 110/74 | HR 70 | Temp 97.9°F | Ht 64.0 in | Wt 147.0 lb

## 2023-07-08 DIAGNOSIS — R3915 Urgency of urination: Secondary | ICD-10-CM | POA: Diagnosis not present

## 2023-07-08 DIAGNOSIS — Z1211 Encounter for screening for malignant neoplasm of colon: Secondary | ICD-10-CM | POA: Insufficient documentation

## 2023-07-08 DIAGNOSIS — K625 Hemorrhage of anus and rectum: Secondary | ICD-10-CM | POA: Insufficient documentation

## 2023-07-08 DIAGNOSIS — R1012 Left upper quadrant pain: Secondary | ICD-10-CM | POA: Insufficient documentation

## 2023-07-08 DIAGNOSIS — K219 Gastro-esophageal reflux disease without esophagitis: Secondary | ICD-10-CM | POA: Insufficient documentation

## 2023-07-08 DIAGNOSIS — K5904 Chronic idiopathic constipation: Secondary | ICD-10-CM | POA: Insufficient documentation

## 2023-07-08 DIAGNOSIS — R1011 Right upper quadrant pain: Secondary | ICD-10-CM | POA: Insufficient documentation

## 2023-07-08 DIAGNOSIS — R140 Abdominal distension (gaseous): Secondary | ICD-10-CM | POA: Insufficient documentation

## 2023-07-08 DIAGNOSIS — N951 Menopausal and female climacteric states: Secondary | ICD-10-CM | POA: Diagnosis not present

## 2023-07-08 DIAGNOSIS — R1310 Dysphagia, unspecified: Secondary | ICD-10-CM | POA: Insufficient documentation

## 2023-07-08 DIAGNOSIS — M940 Chondrocostal junction syndrome [Tietze]: Secondary | ICD-10-CM | POA: Insufficient documentation

## 2023-07-08 DIAGNOSIS — K449 Diaphragmatic hernia without obstruction or gangrene: Secondary | ICD-10-CM | POA: Insufficient documentation

## 2023-07-08 DIAGNOSIS — F411 Generalized anxiety disorder: Secondary | ICD-10-CM

## 2023-07-08 LAB — POC URINALSYSI DIPSTICK (AUTOMATED)
Bilirubin, UA: NEGATIVE
Blood, UA: 1
Glucose, UA: NEGATIVE
Ketones, UA: NEGATIVE
Leukocytes, UA: NEGATIVE
Nitrite, UA: NEGATIVE
Protein, UA: NEGATIVE
Spec Grav, UA: 1.02 (ref 1.010–1.025)
Urobilinogen, UA: 0.2 U/dL
pH, UA: 6 (ref 5.0–8.0)

## 2023-07-08 MED ORDER — CITALOPRAM HYDROBROMIDE 20 MG PO TABS
20.0000 mg | ORAL_TABLET | Freq: Every day | ORAL | 2 refills | Status: DC
Start: 2023-07-08 — End: 2023-12-20

## 2023-07-08 NOTE — Progress Notes (Signed)
 Patient ID: [name] [name], female    DOB: September 14, 1970, 53 y.o.   MRN: [id number]   Assessment & Plan:  Menopausal symptoms -     Citalopram  Hydrobromide; Take 1 tablet (20 mg total) by mouth daily.  Dispense: 30 tablet; Refill: 2  GAD (generalized anxiety disorder) -     Citalopram  Hydrobromide; Take 1 tablet (20 mg total) by mouth daily.  Dispense: 30 tablet; Refill: 2  Urinary urgency -     POCT Urinalysis Dipstick (Automated) -     Urine Culture    Assessment and Plan    Menopausal Symptoms Persistent symptoms despite hormone replacement therapy (HRT) including low energy, dryness, and hot flashes. Previous use of HRT through [facility] with unsatisfactory results. Current use of progesterone  and considering re-trial of Combipatch  (estrogen and testosterone). -Contact gynecologist, Dr. Elvie Pinal, to discuss restarting Combipatch . -Consider Minivelle   Anxiety Reports of increased anxiety, sweating, and discomfort in social situations. Currently using Klonopin  with some relief but concerns about increasing doses and long-term use. -Start Celexa  (citalopram ) to provide a baseline for anxiety management. Monitor for side effects and effectiveness over the next 3-4 weeks. Pt aware of risks vs benefits and possible adverse reactions.   Recurrent Urinary Tract Infections Reports of frequent and urgent urination, previous low-grade infection. -Collect urine sample for analysis today. -Consider daily low-dose antibiotic if recurrent infections continue.  General Health Maintenance -Physical examination and fasting labs scheduled for August 2025.          Return in about 6 months (around 01/05/2024) for physical, fasting labs .    Subjective:    Chief Complaint  Patient presents with   Medical Management of Chronic Issues    Pt in office for 6 mon f/u to discuss anxiety and depression; pt states she feels like constantly having to urinate; urinary frequency and  urgency; last visit with Jodie got better but feels like they keep coming back.     HPI   History of Present Illness   [name] [name] is a 53 year old female who presents with persistent symptoms despite hormone replacement therapy.  She has ongoing issues with hormonal imbalance, including low testosterone levels, despite trying various hormone replacement therapies such as pellets and compounded creams. She has previously used a combi patch containing estrogen and testosterone.  She experiences symptoms of menopause, including hot flashes, body chills, and night sweats, which have recently returned. She reports poor sleep, often waking up at 3 AM, and describes feeling anxious and stressed, attributing these to hormonal changes. She has gained weight and feels generally unwell.  She has a history of recurrent urinary tract infections and reports symptoms of urgency and frequency. A recent urine culture showed a low-grade infection, and she associates these symptoms with dryness and irritation.  She experiences significant anxiety, causing her palms and feet to sweat and making her feel like she might pass out. She currently takes Klonopin , half a tablet in the morning and another half in the afternoon, which helps manage her anxiety. She has tried various medications in the past, including Wellbutrin  and Lexapro , but did not find them effective.  She reports poor sleep quality, waking up frequently at night, and experiencing hot flashes.       Past Medical History:  Diagnosis Date   Anxiety    Depression    GERD (gastroesophageal reflux disease)    History of back surgery 12/18/2018   IBS (irritable bowel syndrome)  Lichen sclerosus     Past Surgical History:  Procedure Laterality Date   AUGMENTATION MAMMAPLASTY Bilateral 03/2017   BACK SURGERY Bilateral 12/18/2018   BILATERAL SALPINGECTOMY Bilateral 10/31/2012   Procedure: BILATERAL SALPINGECTOMY;  Surgeon: Ronal Elvie Pinal, MD;  Location: [facility];  Service: Gynecology;  Laterality: Bilateral;   BREAST ENHANCEMENT SURGERY  04/12/2017   CESAREAN SECTION     CYSTO WITH HYDRODISTENSION N/A 10/31/2012   Procedure: CYSTOSCOPY/HYDRODISTENSION;  Surgeon: Glendia DELENA Elizabeth, MD;  Location: [facility];  Service: Urology;  Laterality: N/A;   DILATION AND CURETTAGE OF UTERUS     DILITATION & CURRETTAGE/HYSTROSCOPY WITH NOVASURE ABLATION N/A 10/31/2012   Procedure: DILATATION & CURETTAGE/HYSTEROSCOPY WITH NOVASURE ABLATION;  Surgeon: Ronal Elvie Pinal, MD;  Location: [facility];  Service: Gynecology;  Laterality: N/A;  Dr Rey Diarmid to start case with a cysto at beginning of case while patient under anesthesia.   ENDOMETRIAL BIOPSY  09/19/12   neg   LAPAROSCOPY Bilateral 10/31/2012   Procedure: LAPAROSCOPY OPERATIVE;  Surgeon: Ronal Elvie Pinal, MD;  Location: [facility];  Service: Gynecology;  Laterality: Bilateral;   LEFT HEART CATH AND CORONARY ANGIOGRAPHY N/A 04/28/2021   Procedure: LEFT HEART CATH AND CORONARY ANGIOGRAPHY;  Surgeon: Jordan, Peter M, MD;  Location: [facility] INVASIVE CV LAB;  Service: Cardiovascular;  Laterality: N/A;    Family History  Problem Relation Age of Onset   Stroke Father    Non-Hodgkin's lymphoma Father    Hypertension Father    Kidney disease Father    Hypertension Mother    Club foot Son     Social History   Tobacco Use   Smoking status: Former    Current packs/day: 0.00    Types: Cigarettes    Quit date: 01/15/2013    Years since quitting: 10.4   Smokeless tobacco: Never  Vaping Use   Vaping status: Former   Quit date: 07/18/2018  Substance Use Topics   Alcohol use: Yes    Alcohol/week: 5.0 standard drinks of alcohol    Types: 5 Standard drinks or equivalent per week   Drug use: Yes    Types: Barbituates     Allergies  Allergen Reactions   Ciprofloxacin Nausea Only    Review of Systems NEGATIVE UNLESS OTHERWISE INDICATED IN HPI      Objective:     BP 110/74 (BP Location:  Left Arm, Patient Position: Sitting)   Pulse 70   Temp 97.9 F (36.6 C) (Temporal)   Ht 5' 4 (1.626 m)   Wt 147 lb (66.7 kg)   LMP  (LMP Unknown)   SpO2 97%   BMI 25.23 kg/m   Wt Readings from Last 3 Encounters:  07/08/23 147 lb (66.7 kg)  03/21/23 147 lb (66.7 kg)  01/05/23 143 lb 12.8 oz (65.2 kg)    BP Readings from Last 3 Encounters:  07/08/23 110/74  03/21/23 110/82  01/05/23 118/70     Physical Exam Vitals and nursing note reviewed.  Constitutional:      General: She is not in acute distress.    Appearance: Normal appearance. She is well-developed. She is not ill-appearing.  HENT:     Head: Normocephalic and atraumatic.  Cardiovascular:     Rate and Rhythm: Normal rate and regular rhythm.     Pulses: Normal pulses.     Heart sounds: Normal heart sounds.  Pulmonary:     Effort: Pulmonary effort is normal.     Breath sounds: Normal breath sounds.  Skin:  General: Skin is warm and dry.  Neurological:     General: No focal deficit present.     Mental Status: She is alert.  Psychiatric:        Mood and Affect: Mood is depressed.      Isrrael Fluckiger M Junaid Wurzer, PA-C

## 2023-07-10 LAB — URINE CULTURE
MICRO NUMBER:: 16056850
SPECIMEN QUALITY:: ADEQUATE

## 2023-07-12 ENCOUNTER — Encounter: Payer: Self-pay | Admitting: Physician Assistant

## 2023-07-12 NOTE — Telephone Encounter (Signed)
Noted

## 2023-07-13 ENCOUNTER — Telehealth (HOSPITAL_BASED_OUTPATIENT_CLINIC_OR_DEPARTMENT_OTHER): Payer: 59 | Admitting: Obstetrics & Gynecology

## 2023-07-13 ENCOUNTER — Encounter (HOSPITAL_BASED_OUTPATIENT_CLINIC_OR_DEPARTMENT_OTHER): Payer: Self-pay | Admitting: Obstetrics & Gynecology

## 2023-07-13 DIAGNOSIS — N898 Other specified noninflammatory disorders of vagina: Secondary | ICD-10-CM | POA: Diagnosis not present

## 2023-07-13 DIAGNOSIS — G4720 Circadian rhythm sleep disorder, unspecified type: Secondary | ICD-10-CM | POA: Diagnosis not present

## 2023-07-13 DIAGNOSIS — N951 Menopausal and female climacteric states: Secondary | ICD-10-CM | POA: Diagnosis not present

## 2023-07-13 DIAGNOSIS — R6882 Decreased libido: Secondary | ICD-10-CM | POA: Diagnosis not present

## 2023-07-13 MED ORDER — ESTRADIOL 0.1 MG/24HR TD PTTW
1.0000 | MEDICATED_PATCH | TRANSDERMAL | 3 refills | Status: DC
Start: 2023-07-14 — End: 2023-10-20

## 2023-07-13 NOTE — Progress Notes (Signed)
Virtual Visit via Video Note  I connected with [name] [name] on 07/13/23 at  8:15 AM EST by a video enabled telemedicine application and verified that I am speaking with the correct person using two identifiers.  Location: Patient: home Provider: office   I discussed the limitations of evaluation and management by telemedicine and the availability of in person appointments. The patient expressed understanding and agreed to proceed.  History of Present Illness: 53 yo G5P1 MWF for virtual visit to discuss concerns about menopause.  She has used testosterone cream in the past.  She's been on combipatch in the past.  She is using nightly progesterone as well.  She has used pellets before with [facility] and her levels were elevated and she had a lot of acne from this.  She was advised she was a "hard case" but they didn't have other suggestions for her other than pellets.  She is also having sleep issues and does take prometrium 100mg  nightly.  She is asking about using the combipatch again.  Given issues with dryness and no ability to adjust dosage of estrogen with combipatch, feel we should use estradiol patch only with oral progesterone.  Dosages discussed.  Risks/side effects discussed.  Questions answered.  Will start with estradiol patch 0.1mg  dosing twice weekly and will increase prometrium to 200mg  nightly.  She does not need RF for this but does for the estradiol patch.  Advised to call with breast tenderness, any concerning symptom or worsening headaches.  She does have tension headaches but no hx of migraines and no hx of migraines with aura.  She does yearly mammograms and last one was in 07/2022.  Has recently gotten reminder about doing this.   Observations/Objective: WNWD WF, NAD  Assessment and Plan: 1. Menopausal symptoms (Primary) - will start estradiol patch and increase prometrium to 200mg  nightly - estradiol (VIVELLE-DOT) 0.1 MG/24HR patch; Place 1 patch (0.1 mg total) onto  the skin 2 (two) times a week.  Dispense: 8 patch; Refill: 3 - recheck 3 months.  Virtual visit is fine. - she will get mammogram this spring and needs AEX in the late summer.  Message sent to scheduler about this.  2. Vaginal dryness - could add vaginale estrogen cream but will wait to see how the patch works first for her  3. Disturbed sleep rhythm  4. Decreased libido - at this time will not stop any topical testosterone or other medications for this as we see what the estradiol will do for her symptoms first. She is ok with this plan.   Follow Up Instructions: I discussed the assessment and treatment plan with the patient. The patient was provided an opportunity to ask questions and all were answered. The patient agreed with the plan and demonstrated an understanding of the instructions.   The patient was advised to call back or seek an in-person evaluation if the symptoms worsen or if the condition fails to improve as anticipated.  I provided 22 minutes of non-face-to-face time during this encounter.   Jerene Bears, MD

## 2023-09-11 ENCOUNTER — Other Ambulatory Visit: Payer: Self-pay | Admitting: Physician Assistant

## 2023-09-12 NOTE — Telephone Encounter (Signed)
 Last OV: 07/08/23  Next OV: 12/20/23  Last Filled: 06/02/23  Quantity: 30 w/ 2 refills

## 2023-09-16 ENCOUNTER — Ambulatory Visit
Admission: RE | Admit: 2023-09-16 | Discharge: 2023-09-16 | Disposition: A | Discharge status: Home / Self Care | Source: Ambulatory Visit | Attending: Physician Assistant | Admitting: Physician Assistant

## 2023-09-16 ENCOUNTER — Other Ambulatory Visit: Payer: Self-pay | Admitting: Physician Assistant

## 2023-09-16 DIAGNOSIS — Z1231 Encounter for screening mammogram for malignant neoplasm of breast: Secondary | ICD-10-CM

## 2023-10-20 ENCOUNTER — Telehealth (HOSPITAL_BASED_OUTPATIENT_CLINIC_OR_DEPARTMENT_OTHER): Payer: 59 | Admitting: Obstetrics & Gynecology

## 2023-10-20 DIAGNOSIS — N951 Menopausal and female climacteric states: Secondary | ICD-10-CM

## 2023-10-20 MED ORDER — ESTRADIOL 0.05 MG/24HR TD PTTW
1.0000 | MEDICATED_PATCH | TRANSDERMAL | 1 refills | Status: DC
Start: 2023-10-20 — End: 2023-12-19

## 2023-10-20 MED ORDER — ESTRADIOL 0.1 MG/GM VA CREA: TOPICAL_CREAM | VAGINAL | 1 refills | Status: AC

## 2023-10-20 NOTE — Progress Notes (Unsigned)
 Virtual Visit via Video Note  I connected with [name] [name] on 10/23/23 at  2:15 PM EDT by a video enabled telemedicine application and verified that I am speaking with the correct person using two identifiers.  Location: Patient: hone Provider: office   I discussed the limitations of evaluation and management by telemedicine and the availability of in person appointments. The patient expressed understanding and agreed to proceed.  History of Present Illness: 53 yo G1P1 MWF with virtual visit after starting the vivelle  dot 0.1mg  and increased prometrium  to 200mg .  She used the 0.1mg  estradiol  patch for about a week but after about a week she felt she was having side effects of this dosage.  She cut the patch in half and symptoms resolved very quickly.  She decreased the progesterone  back to 100mg .  Painful intercourse has improved.  Her vulvar skin is not really improved.  Does have topical steroid.   Observations/Objective: WNWD WF, NAD  Assessment and Plan: 1. Menopausal symptoms - estradiol  (ESTRACE ) 0.1 MG/GM vaginal cream; Apply blueberry size amount to skin two to three times weekly.  Dispense: 42.5 g; Refill: 1 - estradiol  (VIVELLE -DOT) 0.05 MG/24HR patch; Place 1 patch (0.05 mg total) onto the skin 2 (two) times a week.  Dispense: 24 patch; Refill: 1   Follow Up Instructions: I discussed the assessment and treatment plan with the patient. The patient was provided an opportunity to ask questions and all were answered. The patient agreed with the plan and demonstrated an understanding of the instructions.   The patient was advised to call back or seek an in-person evaluation if the symptoms worsen or if the condition fails to improve as anticipated.  I provided 22 minutes of non-face-to-face time during this encounter.   Lillian Rein, MD

## 2023-10-23 ENCOUNTER — Encounter (HOSPITAL_BASED_OUTPATIENT_CLINIC_OR_DEPARTMENT_OTHER): Payer: Self-pay | Admitting: Obstetrics & Gynecology

## 2023-12-19 ENCOUNTER — Ambulatory Visit (INDEPENDENT_AMBULATORY_CARE_PROVIDER_SITE_OTHER): Payer: Self-pay | Admitting: Obstetrics & Gynecology

## 2023-12-19 VITALS — BP 106/88 | HR 71 | Wt 135.0 lb

## 2023-12-19 DIAGNOSIS — Z01419 Encounter for gynecological examination (general) (routine) without abnormal findings: Secondary | ICD-10-CM

## 2023-12-19 DIAGNOSIS — Z79890 Hormone replacement therapy (postmenopausal): Secondary | ICD-10-CM

## 2023-12-19 DIAGNOSIS — L900 Lichen sclerosus et atrophicus: Secondary | ICD-10-CM | POA: Diagnosis not present

## 2023-12-19 DIAGNOSIS — Z01411 Encounter for gynecological examination (general) (routine) with abnormal findings: Secondary | ICD-10-CM

## 2023-12-19 DIAGNOSIS — N951 Menopausal and female climacteric states: Secondary | ICD-10-CM | POA: Diagnosis not present

## 2023-12-19 MED ORDER — PROGESTERONE MICRONIZED 100 MG PO CAPS
ORAL_CAPSULE | ORAL | 3 refills | Status: AC
Start: 2023-12-19 — End: ?

## 2023-12-19 MED ORDER — ESTRATEST H.S. 0.625-1.25 MG PO TABS
1.0000 | ORAL_TABLET | Freq: Every day | ORAL | 1 refills | Status: AC
Start: 2023-12-19 — End: ?

## 2023-12-19 NOTE — Progress Notes (Unsigned)
 ANNUAL EXAM Patient name: [name] [name] MRN [id number]  Date of birth: May 05, 1971 Chief Complaint:   No chief complaint on file.  History of Present Illness:   [name] [name] is a 53 y.o. [id number] Caucasian female being seen today for a routine annual exam.  On HRT.  Decreased libido is still her bigger issue.  Discussed trial of estratest .  Taking 100mg  progesterone  at night.  Denies vaginal bleeding.     No LMP recorded (lmp unknown). Patient is postmenopausal.   Last pap 12/13/2022. Results were: {Pap findings:25134}. H/O abnormal pap: {yes/yes***/no:23866} Last mammogram: 09/16/2023. Results were: normal. Family h/o breast cancer: no Last colonoscopy: 01/24/2019. Results were: normal. Family h/o colorectal cancer: no     07/13/2023    8:12 AM 07/08/2023   10:34 AM 03/21/2023    2:55 PM 01/05/2023   10:21 AM 12/13/2022    3:32 PM  Depression screen PHQ 2/9  Decreased Interest 0 3 0 3 0  Down, Depressed, Hopeless 0 2 0 2 0  PHQ - 2 Score 0 5 0 5 0  Altered sleeping  3  2   Tired, decreased energy  3  3   Change in appetite  1  1   Feeling bad or failure about yourself   1  1   Trouble concentrating  1  1   Moving slowly or fidgety/restless  1  0   Suicidal thoughts  1  0   PHQ-9 Score  16  13   Difficult doing work/chores  Very difficult  Extremely dIfficult         03/21/2023    2:55 PM 01/05/2023   10:22 AM 07/09/2022   10:00 AM 01/05/2021   10:34 AM  GAD 7 : Generalized Anxiety Score  Nervous, Anxious, on Edge 0 3 2 3   Control/stop worrying 0 3 2 2   Worry too much - different things 0 3 2 2   Trouble relaxing 0 3 2   Restless 0 2 1 2   Easily annoyed or irritable 0 3 2 2   Afraid - awful might happen 0 3 2 2   Total GAD 7 Score 0 20 13   Anxiety Difficulty Not difficult at all Extremely difficult Somewhat difficult Very difficult     Review of Systems:   Pertinent items are noted in HPI Denies any headaches, blurred vision, fatigue, shortness of breath, chest  pain, abdominal pain, abnormal vaginal discharge/itching/odor/irritation, problems with periods, bowel movements, urination, or intercourse unless otherwise stated above. Pertinent History Reviewed:  Reviewed past medical,surgical, social and family history.  Reviewed problem list, medications and allergies. Physical Assessment:  There were no vitals filed for this visit.There is no height or weight on file to calculate BMI.        Physical Examination:   General appearance - well appearing, and in no distress  Mental status - alert, oriented to person, place, and time  Psych:  She has a normal mood and affect  Skin - warm and dry, normal color, no suspicious lesions noted  Chest - effort normal, all lung fields clear to auscultation bilaterally  Heart - normal rate and regular rhythm  Neck:  midline trachea, no thyromegaly or nodules  Breasts - breasts appear normal, no suspicious masses, no skin or nipple changes or  axillary nodes  Abdomen - soft, nontender, nondistended, no masses or organomegaly  Pelvic - VULVA: hypopigmentation of inner labia minora bilaterally, no ulcerations noted, no tenderness.   VAGINA: normal appearing vagina  with normal color and discharge, no lesions   CERVIX: normal appearing cervix without discharge or lesions, no CMT  Thin prep pap is not indicated  UTERUS: uterus is felt to be normal size, shape, consistency and nontender   ADNEXA: No adnexal masses or tenderness noted.  Rectal - normal rectal, good sphincter tone, no masses felt.   Extremities:  No swelling or varicosities noted  Chaperone present for exam  No results found for this or any previous visit (from the past 24 hours).  Assessment & Plan:    No orders of the defined types were placed in this encounter.   Meds: No orders of the defined types were placed in this encounter.   Follow-up: No follow-ups on file.  Rolande JONELLE Edison, CMA 12/19/2023 2:32 PM

## 2023-12-20 ENCOUNTER — Ambulatory Visit (INDEPENDENT_AMBULATORY_CARE_PROVIDER_SITE_OTHER): Payer: 59 | Admitting: Physician Assistant

## 2023-12-20 VITALS — BP 98/72 | HR 73 | Temp 98.3°F | Ht 63.39 in | Wt 135.4 lb

## 2023-12-20 DIAGNOSIS — F411 Generalized anxiety disorder: Secondary | ICD-10-CM

## 2023-12-20 DIAGNOSIS — Z0000 Encounter for general adult medical examination without abnormal findings: Secondary | ICD-10-CM | POA: Diagnosis not present

## 2023-12-20 DIAGNOSIS — N951 Menopausal and female climacteric states: Secondary | ICD-10-CM | POA: Diagnosis not present

## 2023-12-20 LAB — CBC WITH DIFFERENTIAL/PLATELET
Basophils Absolute: 0 K/uL (ref 0.0–0.1)
Basophils Relative: 0.5 % (ref 0.0–3.0)
Eosinophils Absolute: 0.1 K/uL (ref 0.0–0.7)
Eosinophils Relative: 1.8 % (ref 0.0–5.0)
HCT: 36.5 % (ref 36.0–46.0)
Hemoglobin: 12.5 g/dL (ref 12.0–15.0)
Lymphocytes Relative: 27 % (ref 12.0–46.0)
Lymphs Abs: 1.2 K/uL (ref 0.7–4.0)
MCHC: 34.4 g/dL (ref 30.0–36.0)
MCV: 93.6 fl (ref 78.0–100.0)
Monocytes Absolute: 0.4 K/uL (ref 0.1–1.0)
Monocytes Relative: 7.9 % (ref 3.0–12.0)
Neutro Abs: 2.9 K/uL (ref 1.4–7.7)
Neutrophils Relative %: 62.8 % (ref 43.0–77.0)
Platelets: 236 K/uL (ref 150.0–400.0)
RBC: 3.9 Mil/uL (ref 3.87–5.11)
RDW: 12.8 % (ref 11.5–15.5)
WBC: 4.6 K/uL (ref 4.0–10.5)

## 2023-12-20 LAB — HEMOGLOBIN A1C: Hgb A1c MFr Bld: 5.7 % (ref 4.6–6.5)

## 2023-12-20 LAB — LIPID PANEL
Cholesterol: 168 mg/dL (ref 0–200)
HDL: 60 mg/dL (ref >39.00)
LDL Cholesterol: 96 mg/dL (ref 0–99)
NonHDL: 107.99
Total CHOL/HDL Ratio: 3
Triglycerides: 60 mg/dL (ref 0.0–149.0)
VLDL: 12 mg/dL (ref 0.0–40.0)

## 2023-12-20 LAB — COMPREHENSIVE METABOLIC PANEL WITH GFR
ALT: 14 U/L (ref 0–35)
AST: 18 U/L (ref 0–37)
Albumin: 4.3 g/dL (ref 3.5–5.2)
Alkaline Phosphatase: 65 U/L (ref 39–117)
BUN: 19 mg/dL (ref 6–23)
CO2: 27 meq/L (ref 19–32)
Calcium: 9.1 mg/dL (ref 8.4–10.5)
Chloride: 104 meq/L (ref 96–112)
Creatinine, Ser: 0.66 mg/dL (ref 0.40–1.20)
GFR: 98.79 mL/min (ref >60.00)
Glucose, Bld: 76 mg/dL (ref 70–99)
Potassium: 3.9 meq/L (ref 3.5–5.1)
Sodium: 139 meq/L (ref 135–145)
Total Bilirubin: 0.7 mg/dL (ref 0.2–1.2)
Total Protein: 7.1 g/dL (ref 6.0–8.3)

## 2023-12-20 LAB — TSH: TSH: 0.79 u[IU]/mL (ref 0.35–5.50)

## 2023-12-20 MED ORDER — CITALOPRAM HYDROBROMIDE 20 MG PO TABS: 20.0000 mg | ORAL_TABLET | Freq: Every day | ORAL | 1 refills | Status: DC

## 2023-12-20 NOTE — Progress Notes (Signed)
 Patient ID: [name] [name], female    DOB: 1971/04/16, 53 y.o.   MRN: [id number]   Assessment & Plan:  Annual physical exam -     CBC with Differential/Platelet -     Comprehensive metabolic panel with GFR -     Hemoglobin A1c -     Lipid panel -     TSH  Menopausal symptoms -     Citalopram  Hydrobromide; Take 1 tablet (20 mg total) by mouth daily.  Dispense: 90 tablet; Refill: 1  GAD (generalized anxiety disorder) -     Citalopram  Hydrobromide; Take 1 tablet (20 mg total) by mouth daily.  Dispense: 90 tablet; Refill: 1      Assessment and Plan Assessment & Plan Anxiety Disorder [name] [name] experiences social anxiety and claustrophobia in crowded places. Celexa  20 mg has reduced her anxiety levels, and she no longer requires Klonopin  in the afternoon, indicating improvement. She prefers a 90-day refill of Celexa  to minimize pharmacy visits. - Continue Celexa  20 mg daily with a 90-day refill - Continue Klonopin  1 mg as needed, half tablet twice daily PDMP reviewed today, no red flags, filling appropriately.    Hormone Replacement Therapy [name] [name] is transitioning from an estrogen patch to an oral testosterone medication, which is more affordable and recommended by her gynecologist. She also uses estradiol  cream for skin thinning. - Continue hormone replacement therapy as per gynecologist's recommendations  Annual Health Maintenance [name] [name], a 53 year old female, has lost 12 pounds since February through increased physical activity and healthier eating. She is current on cervical cancer screening, mammogram, colonoscopy, and had a recent skin cancer screening with no findings. Her tetanus shot was last administered in 2017. She plans to receive a shingles vaccine on Friday to avoid side effects during her vacation. - Perform blood work, including cholesterol levels - Administer shingles vaccine on Friday - Monitor weight and continue healthy lifestyle changes - Ensure all preventive  screenings are up to date  Age-appropriate screening and counseling performed today. Will check labs and call with results. Preventive measures discussed and printed in AVS for patient.   Patient Counseling: [x]   Nutrition: Stressed importance of moderation in sodium/caffeine intake, saturated fat and cholesterol, caloric balance, sufficient intake of fresh fruits, vegetables, and fiber.  [x]   Stressed the importance of regular exercise.   [x]   Substance Abuse: Discussed cessation/primary prevention of tobacco, alcohol, or other drug use; driving or other dangerous activities under the influence; availability of treatment for abuse.   []   Injury prevention: Discussed safety belts, safety helmets, smoke detector, smoking near bedding or upholstery.   []   Sexuality: Discussed sexually transmitted diseases, partner selection, use of condoms, avoidance of unintended pregnancy  and contraceptive alternatives.   [x]   Dental health: Discussed importance of regular tooth brushing, flossing, and dental visits.  [x]   Health maintenance and immunizations reviewed. Please refer to Health maintenance section.         Return in about 6 months (around 06/21/2024) for recheck/follow-up.    Subjective:    Chief Complaint  Patient presents with   Annual Exam    Pt in today for annual CPE and labs; pt is not fasting for labs;     HPI Discussed the use of AI scribe software for clinical note transcription with the patient, who gave verbal consent to proceed.  History of Present Illness [name] [name] is a 53 year old female who presents for her annual physical exam.  She has experienced a weight  loss of twelve pounds since February, which she attributes to daily walking and healthier eating habits, including avoiding sweets and increasing protein intake. She notes that her weight tends to increase during tax season due to poor eating habits.  She is currently on Celexa  20 mg for baseline  anxiety, which she started earlier this year. She also has a prescription for Klonopin  1 mg, taking half a tablet twice daily as needed for anxiety. She notes improvement in her anxiety symptoms, particularly not needing the afternoon dose of Klonopin . However, she still experiences social anxiety, especially in crowded places, which causes her palms to sweat and makes her feel claustrophobic.  She is working with her gynecologist on hormone replacement therapy. She is switching from an estrogen patch to an oral pill containing testosterone and continues to use estradiol  cream for skin thinning. She recently filled the new prescription and plans to start it soon.  She had a skin cancer screening recently, which was negative. She plans to schedule a shingles vaccination for a later date due to an upcoming vacation.     Past Medical History:  Diagnosis Date   Anxiety    Depression    GERD (gastroesophageal reflux disease)    History of back surgery 12/18/2018   IBS (irritable bowel syndrome)    Lichen sclerosus     Past Surgical History:  Procedure Laterality Date   AUGMENTATION MAMMAPLASTY Bilateral 03/2017   BACK SURGERY Bilateral 12/18/2018   BILATERAL SALPINGECTOMY Bilateral 10/31/2012   Procedure: BILATERAL SALPINGECTOMY;  Surgeon: Ronal Elvie Pinal, MD;  Location: [facility];  Service: Gynecology;  Laterality: Bilateral;   BREAST ENHANCEMENT SURGERY  04/12/2017   CESAREAN SECTION     CYSTO WITH HYDRODISTENSION N/A 10/31/2012   Procedure: CYSTOSCOPY/HYDRODISTENSION;  Surgeon: Glendia DELENA Elizabeth, MD;  Location: [facility];  Service: Urology;  Laterality: N/A;   DILATION AND CURETTAGE OF UTERUS     DILITATION & CURRETTAGE/HYSTROSCOPY WITH NOVASURE ABLATION N/A 10/31/2012   Procedure: DILATATION & CURETTAGE/HYSTEROSCOPY WITH NOVASURE ABLATION;  Surgeon: Ronal Elvie Pinal, MD;  Location: [facility];  Service: Gynecology;  Laterality: N/A;  Dr Rey Diarmid to start case with a cysto at beginning of  case while patient under anesthesia.   ENDOMETRIAL BIOPSY  09/19/12   neg   LAPAROSCOPY Bilateral 10/31/2012   Procedure: LAPAROSCOPY OPERATIVE;  Surgeon: Ronal Elvie Pinal, MD;  Location: [facility];  Service: Gynecology;  Laterality: Bilateral;   LEFT HEART CATH AND CORONARY ANGIOGRAPHY N/A 04/28/2021   Procedure: LEFT HEART CATH AND CORONARY ANGIOGRAPHY;  Surgeon: Swazil[place], Peter M, MD;  Location: [facility] INVASIVE CV LAB;  Service: Cardiovascular;  Laterality: N/A;    Family History  Problem Relation Age of Onset   Hypertension Mother    Stroke Father    Non-Hodgkin's lymphoma Father    Hypertension Father    Kidney disease Father    Club foot Son    Breast cancer Neg Hx     Social History   Tobacco Use   Smoking status: Former    Current packs/day: 0.00    Types: Cigarettes    Quit date: 01/15/2013    Years since quitting: 10.9   Smokeless tobacco: Never  Vaping Use   Vaping status: Former   Quit date: 07/18/2018  Substance Use Topics   Alcohol use: Yes    Alcohol/week: 5.0 standard drinks of alcohol    Types: 5 Standard drinks or equivalent per week   Drug use: Yes    Types: Barbituates  Allergies  Allergen Reactions   Ciprofloxacin Nausea Only    Review of Systems NEGATIVE UNLESS OTHERWISE INDICATED IN HPI      Objective:     BP 98/72 (BP Location: Right Arm, Patient Position: Sitting, Cuff Size: Normal)   Pulse 73   Temp 98.3 F (36.8 C) (Temporal)   Ht 5' 3.39 (1.61 m)   Wt 135 lb 6.4 oz (61.4 kg)   LMP  (LMP Unknown)   SpO2 94%   BMI 23.69 kg/m   Wt Readings from Last 3 Encounters:  12/20/23 135 lb 6.4 oz (61.4 kg)  12/19/23 135 lb (61.2 kg)  07/08/23 147 lb (66.7 kg)    BP Readings from Last 3 Encounters:  12/20/23 98/72  12/19/23 106/88  07/08/23 110/74     Physical Exam Vitals and nursing note reviewed.  Constitutional:      Appearance: Normal appearance. She is normal weight. She is not toxic-appearing.  HENT:     Head: Normocephalic  and atraumatic.     Right Ear: Tympanic membrane, ear canal and external ear normal.     Left Ear: Tympanic membrane, ear canal and external ear normal.     Nose: Nose normal.     Mouth/Throat:     Mouth: Mucous membranes are moist.  Eyes:     Extraocular Movements: Extraocular movements intact.     Conjunctiva/sclera: Conjunctivae normal.     Pupils: Pupils are equal, round, and reactive to light.  Cardiovascular:     Rate and Rhythm: Normal rate and regular rhythm.     Pulses: Normal pulses.     Heart sounds: Normal heart sounds.  Pulmonary:     Effort: Pulmonary effort is normal.     Breath sounds: Normal breath sounds.  Abdominal:     General: Abdomen is flat. Bowel sounds are normal.     Palpations: Abdomen is soft.  Musculoskeletal:        General: Normal range of motion.     Cervical back: Normal range of motion and neck supple.  Skin:    General: Skin is warm and dry.  Neurological:     General: No focal deficit present.     Mental Status: She is alert and oriented to person, place, and time.  Psychiatric:        Mood and Affect: Mood normal.        Behavior: Behavior normal.        Thought Content: Thought content normal.        Judgment: Judgment normal.             Eryn Krejci M Harl Wiechmann, PA-C

## 2023-12-21 ENCOUNTER — Ambulatory Visit: Payer: Self-pay | Admitting: Physician Assistant

## 2023-12-22 ENCOUNTER — Encounter (HOSPITAL_BASED_OUTPATIENT_CLINIC_OR_DEPARTMENT_OTHER): Payer: Self-pay | Admitting: Obstetrics & Gynecology

## 2024-01-01 ENCOUNTER — Other Ambulatory Visit: Payer: Self-pay | Admitting: Physician Assistant

## 2024-01-02 NOTE — Telephone Encounter (Signed)
 Last OV: 12/20/23  Next OV: 06/21/24  Last Filled: 09/12/23  Quantity: 30 w/ 2 refills

## 2024-01-10 ENCOUNTER — Ambulatory Visit: Payer: Self-pay

## 2024-01-10 NOTE — Telephone Encounter (Signed)
 Reviewed

## 2024-01-10 NOTE — Telephone Encounter (Signed)
 FYI Only or Action Required?: FYI only for provider.  Patient was last seen in primary care on 03/21/2023 by Jodie Lavern CROME, MD.  Called Nurse Triage reporting Ear Pain and ear congestion.  Symptoms began several weeks ago.  Interventions attempted: OTC medications: Tylenol, Nasacort, Ice/heat application, and Other: chewing gum, plugging nose and trying to pop ears.  Symptoms are: left ear and upper jaw pain, left ear congestion unchanged.  Triage Disposition: See Physician Within 24 Hours  Patient/caregiver understands and will follow disposition?: Yes               Copied from CRM [id number]. Topic: Clinical - Red Word Triage >> Jan 10, 2024 12:09 PM Gennette ORN wrote: Red Word that prompted transfer to Nurse Triage: Patient is having left ear pain it has been going on for two weeks. She stated it's clogged. Reason for Disposition  Earache lasts > 1 hour  Answer Assessment - Initial Assessment Questions 1. LOCATION: Which ear is involved?       Left ear.  2. SENSATION: Describe how the ear feels. (e.g., stuffy, full, plugged).      She states it feels plugged and she can't get the ear to pop.  3. ONSET:  When did the ear symptoms start?       X 2 weeks.  4. PAIN: Do you also have an earache? If Yes, ask: How bad is it? (Scale 0-10; none, mild, moderate or severe)     Left ear/upper jaw pain, intermittent 3/10.  5. CAUSE: What do you think is causing the ear congestion? (e.g., common cold, nasal allergies, recent flight, recent snorkeling)     Unsure. She states it feels like she got water in her ear.  6. OTHER SYMPTOMS: Do you have any other symptoms? (e.g., ear drainage, hay fever symptoms such as sneezing or a clear nasal discharge; cold symptoms such as a cough or runny nose)     Denies cough, runny nose, sneezing, fever, chest pain. She states she can hear out of the left ear.  7. PREGNANCY: Is there any chance you are pregnant? When was  your last menstrual period?     N/A.  Protocols used: Ear - Congestion-A-AH

## 2024-01-11 ENCOUNTER — Ambulatory Visit (INDEPENDENT_AMBULATORY_CARE_PROVIDER_SITE_OTHER): Admitting: Family

## 2024-01-11 ENCOUNTER — Encounter: Payer: Self-pay | Admitting: Family

## 2024-01-11 VITALS — BP 101/67 | HR 73 | Temp 97.2°F | Ht 63.39 in | Wt 133.6 lb

## 2024-01-11 DIAGNOSIS — H9312 Tinnitus, left ear: Secondary | ICD-10-CM

## 2024-01-11 DIAGNOSIS — H938X2 Other specified disorders of left ear: Secondary | ICD-10-CM | POA: Diagnosis not present

## 2024-01-11 NOTE — Progress Notes (Signed)
 Patient ID: [name] [name], female    DOB: 05-May-1971, 53 y.o.   MRN: [id number]  Chief Complaint  Patient presents with  . Ear Fullness    Pt c/o left ear fullness and nasal congestion. Present for a couple of weeks. Has tried nasacort  and heating pad, which did not help.   Discussed the use of AI scribe software for clinical note transcription with the patient, who gave verbal consent to proceed.  History of Present Illness [name] [name] is a 53 year old female who presents with left ear congestion and pressure.  Left ear congestion and pressure - Congested sensation and pressure in the left ear, described as feeling like 'a barrel' - No recent upper respiratory infection, cold, or illness - No otorrhea (ear drainage) - Occasional tinnitus in the left ear, with slight improvement noted - No hearing loss or decreased hearing in the affected ear - Pain radiating down from the left ear - Attempts to relieve pressure by bearing down provides slight relief on right side, but the left ear does not pop  Symptom management - Uses Nasacort  nasal spray, two squirts in each nostril once daily, uncertain of effectiveness and stopped using - Has tried using a hairdryer and chewing gum to alleviate symptoms   Assessment & Plan Left ear congestion and Eustachian tube dysfunction Left ear congestion with fullness and pressure due to Eustachian tube dysfunction. No infection or fluid. Possible allergen exposure from recent lake trip. - Continue Nasacort  nasal spray, increase to twice daily for 3-5 days, if sx improve, ok to stay on 1 squirt each side twice a day or daily. - Consider OTC pseudoephedrine (Sudafed) if needed, caution for side effects. - Recommend saline nasal spray twice daily. - Ok to continue heat application and Valsalva maneuver. - Advise follow-up if symptoms worsen or persist.  Tinnitus Intermittent ringing in left ear, possibly related to Eustachian tube  dysfunction. - Monitor symptoms, consider audiologist referral if persistent or worsening.    Outpatient Medications Prior to Visit  Medication Sig Dispense Refill  . citalopram  (CELEXA ) 20 MG tablet Take 1 tablet (20 mg total) by mouth daily. 90 tablet 1  . clobetasol  ointment (TEMOVATE ) 0.05 % Apply topically to affected skin nightly for 4 weeks.  Then use use topically twice weekly until next appt. 30 g 2  . clonazePAM  (KLONOPIN ) 1 MG tablet TAKE 1/2 TABLET(0.5 MG) BY MOUTH TWICE DAILY AS NEEDED FOR ANXIETY 30 tablet 2  . estradiol  (ESTRACE ) 0.1 MG/GM vaginal cream Apply blueberry size amount to skin two to three times weekly. 42.5 g 1  . estrogen-methylTESTOSTERone (ESTRATEST  H.S.) 0.625-1.25 MG tablet Take 1 tablet by mouth daily. 90 tablet 1  . Ibuprofen -Acetaminophen  (ADVIL  DUAL ACTION PO) Take 1 tablet by mouth as needed.    . metroNIDAZOLE  (METROCREAM ) 0.75 % cream Apply 1 Application topically 2 (two) times daily.    . progesterone  (PROMETRIUM ) 100 MG capsule TAKE 1 TO 2 CAPSULES BY MOUTH EVERY NIGHT AS NEEDED 180 capsule 3  . VITAMIN D  PO Take by mouth daily in the afternoon.     No facility-administered medications prior to visit.   Past Medical History:  Diagnosis Date  . Anxiety   . Depression   . GERD (gastroesophageal reflux disease)   . History of back surgery 12/18/2018  . IBS (irritable bowel syndrome)   . Lichen sclerosus    Past Surgical History:  Procedure Laterality Date  . AUGMENTATION MAMMAPLASTY Bilateral 03/2017  . BACK SURGERY  Bilateral 12/18/2018  . BILATERAL SALPINGECTOMY Bilateral 10/31/2012   Procedure: BILATERAL SALPINGECTOMY;  Surgeon: Ronal Elvie Pinal, MD;  Location: [facility];  Service: Gynecology;  Laterality: Bilateral;  . BREAST ENHANCEMENT SURGERY  04/12/2017  . CESAREAN SECTION    . CYSTO WITH HYDRODISTENSION N/A 10/31/2012   Procedure: CYSTOSCOPY/HYDRODISTENSION;  Surgeon: Glendia DELENA Elizabeth, MD;  Location: [facility];  Service: Urology;   Laterality: N/A;  . DILATION AND CURETTAGE OF UTERUS    . DILITATION & CURRETTAGE/HYSTROSCOPY WITH NOVASURE ABLATION N/A 10/31/2012   Procedure: DILATATION & CURETTAGE/HYSTEROSCOPY WITH NOVASURE ABLATION;  Surgeon: Ronal Elvie Pinal, MD;  Location: [facility];  Service: Gynecology;  Laterality: N/A;  Dr Rey Diarmid to start case with a cysto at beginning of case while patient under anesthesia.  . ENDOMETRIAL BIOPSY  09/19/12   neg  . LAPAROSCOPY Bilateral 10/31/2012   Procedure: LAPAROSCOPY OPERATIVE;  Surgeon: Ronal Elvie Pinal, MD;  Location: [facility];  Service: Gynecology;  Laterality: Bilateral;  . LEFT HEART CATH AND CORONARY ANGIOGRAPHY N/A 04/28/2021   Procedure: LEFT HEART CATH AND CORONARY ANGIOGRAPHY;  Surgeon: [place]azil[place], Peter M, MD;  Location: [facility] INVASIVE CV LAB;  Service: Cardiovascular;  Laterality: N/A;   Allergies  Allergen Reactions  . Ciprofloxacin Nausea Only      Objective:    Physical Exam Vitals and nursing note reviewed.  Constitutional:      Appearance: Normal appearance. She is not ill-appearing.     Interventions: Face mask in place.  HENT:     Right Ear: Tympanic membrane and ear canal normal.     Left Ear: Tympanic membrane and ear canal normal.     Nose: No congestion or rhinorrhea.     Right Sinus: No frontal sinus tenderness.     Left Sinus: No frontal sinus tenderness.     Mouth/Throat:     Mouth: Mucous membranes are moist.     Pharynx: Postnasal drip present. No pharyngeal swelling, oropharyngeal exudate, posterior oropharyngeal erythema or uvula swelling.     Tonsils: No tonsillar exudate or tonsillar abscesses.  Cardiovascular:     Rate and Rhythm: Normal rate and regular rhythm.  Pulmonary:     Effort: Pulmonary effort is normal.     Breath sounds: Normal breath sounds.  Musculoskeletal:        General: Normal range of motion.  Lymphadenopathy:     Head:     Right side of head: No preauricular or posterior auricular adenopathy.     Left side of  head: No preauricular or posterior auricular adenopathy.     Cervical: No cervical adenopathy.  Skin:    General: Skin is warm and dry.  Neurological:     Mental Status: She is alert.  Psychiatric:        Mood and Affect: Mood normal.        Behavior: Behavior normal.    BP 101/67 (BP Location: Left Arm, Patient Position: Sitting, Cuff Size: Normal)   Pulse 73   Temp (!) 97.2 F (36.2 C) (Temporal)   Ht 5' 3.39 (1.61 m)   Wt 133 lb 9.6 oz (60.6 kg)   LMP  (LMP Unknown)   SpO2 98%   BMI 23.38 kg/m  Wt Readings from Last 3 Encounters:  01/11/24 133 lb 9.6 oz (60.6 kg)  12/20/23 135 lb 6.4 oz (61.4 kg)  12/19/23 135 lb (61.2 kg)      Lucius Krabbe, NP

## 2024-04-16 ENCOUNTER — Encounter: Payer: Self-pay | Admitting: Physician Assistant

## 2024-04-16 ENCOUNTER — Telehealth: Admitting: Physician Assistant

## 2024-04-16 DIAGNOSIS — L089 Local infection of the skin and subcutaneous tissue, unspecified: Secondary | ICD-10-CM | POA: Diagnosis not present

## 2024-04-16 DIAGNOSIS — W57XXXA Bitten or stung by nonvenomous insect and other nonvenomous arthropods, initial encounter: Secondary | ICD-10-CM

## 2024-04-16 DIAGNOSIS — S60361A Insect bite (nonvenomous) of right thumb, initial encounter: Secondary | ICD-10-CM

## 2024-04-16 MED ORDER — PREDNISONE 50 MG PO TABS: ORAL_TABLET | ORAL | 0 refills | Status: DC

## 2024-04-16 NOTE — Telephone Encounter (Signed)
 See pt msg and advise if appt is needed or recommendations

## 2024-04-16 NOTE — Telephone Encounter (Signed)
Virtual scheduled today  

## 2024-04-16 NOTE — Progress Notes (Signed)
   Virtual Visit via Video Note  I connected with  [name] [name]  on 04/16/24 at  8:30 AM EST by a video enabled telemedicine application and verified that I am speaking with the correct person using two identifiers.  Location: Patient: home Provider: [employer] [facility] [employer] Persons present: Patient and myself   I discussed the limitations of evaluation and management by telemedicine and the availability of in person appointments. The patient expressed understanding and agreed to proceed.   History of Present Illness:  Discussed the use of AI scribe software for clinical note transcription with the patient, who gave verbal consent to proceed.  History of Present Illness [name] [name] is a 53 year old female who presents with worsening swelling and pain in her right thumb following a bee sting.  Two days ago, she was stung by a yellow jacket on the pad of her right thumb. Since then, she has experienced increasing swelling and pain, which has spread into her thumb joint, making it difficult to bend her thumb. She describes the pain as 'excruciating' and notes that it has impacted her ability to type or write.  She has attempted various over-the-counter treatments including ice, Benadryl, Advil , and hydrocortisone cream, but reports little relief. She has also used 'after bite' medication.  No current fever or chills, although she felt slightly feverish the previous day. She reports waking up in pain the first night after the sting. No throat closing or severe allergic reactions noted.  Her past medical history includes recent major dental surgery for which she completed a course of antibiotics last Thursday. She is right-handed.     Observations/Objective:   Gen: Awake, alert, no acute distress Resp: Breathing is even and non-labored Psych: calm/pleasant demeanor Neuro: Alert and Oriented x 3, + facial symmetry, speech is clear. MSK: R thumb edematous  diffusely, limited ROM due to edema; sensation intact per patient and starting to itch   Assessment and Plan:  Assessment and Plan Assessment & Plan Bee sting (yellow jacket) of right thumb Bee sting on the pad of the right thumb by a yellow jacket two days ago, with worsening swelling and inability to bend the thumb. No signs of systemic allergic reaction such as throat closing or severe itching. Likely a venom response rather than infection or cellulitis. Recent dental surgery and recent antibiotic use noted. - Prescribed prednisone 50 mg for 4-5 days, starting with one dose this morning. Take early in the morning to avoid sleep disturbances. - Advised soaking the thumb in lukewarm water  with Epsom salt to reduce swelling. - Continue Benadryl or Zyrtec for itching. - Monitor for signs of infection such as fever or worsening symptoms, and consider antibiotics if these occur.    Follow Up Instructions:    I discussed the assessment and treatment plan with the patient. The patient was provided an opportunity to ask questions and all were answered. The patient agreed with the plan and demonstrated an understanding of the instructions.   The patient was advised to call back or seek an in-person evaluation if the symptoms worsen or if the condition fails to improve as anticipated.  [name] Lippard M Kailoni Vahle, PA-C

## 2024-04-17 NOTE — Telephone Encounter (Signed)
 Called pt and verbally advised PCP, pt admits swelling going down and redness subsided. Advised pt if anything changes and turns worse she will need to let our office know and be seen in ED for evaluation. Pt verbalized understanding of same treatment plan as yesterday and will let us  know if anything changes.

## 2024-04-19 ENCOUNTER — Ambulatory Visit (INDEPENDENT_AMBULATORY_CARE_PROVIDER_SITE_OTHER)

## 2024-04-19 DIAGNOSIS — Z23 Encounter for immunization: Secondary | ICD-10-CM

## 2024-04-19 NOTE — Progress Notes (Signed)
 Patient is in office today for a nurse visit for Immunization FLU. Patient Injection was given in the  Left deltoid. Patient tolerated injection well.Patient is in office today for a nurse visit for Immunization Pneumococcal. Patient Injection was given in the  Right deltoid. Patient tolerated injection well.

## 2024-05-03 ENCOUNTER — Other Ambulatory Visit: Payer: Self-pay | Admitting: Physician Assistant

## 2024-05-03 NOTE — Telephone Encounter (Signed)
 Last OV: 12/20/23  Next OV: 05/10/24  Last filled: 01/02/24  Quantity: 30 w/ 2 refills

## 2024-05-10 ENCOUNTER — Ambulatory Visit (INDEPENDENT_AMBULATORY_CARE_PROVIDER_SITE_OTHER)

## 2024-05-10 DIAGNOSIS — Z23 Encounter for immunization: Secondary | ICD-10-CM | POA: Diagnosis not present

## 2024-05-10 NOTE — Progress Notes (Signed)
 Patient is in office today for a nurse visit for Immunization. Patient Injection was given in the  Left deltoid. Patient tolerated injection well.

## 2024-06-21 ENCOUNTER — Encounter: Payer: Self-pay | Admitting: Physician Assistant

## 2024-06-21 ENCOUNTER — Ambulatory Visit: Admitting: Physician Assistant

## 2024-06-21 VITALS — BP 108/68 | HR 75 | Temp 98.1°F | Ht 63.39 in | Wt 126.0 lb

## 2024-06-21 DIAGNOSIS — F411 Generalized anxiety disorder: Secondary | ICD-10-CM | POA: Diagnosis not present

## 2024-06-21 MED ORDER — CITALOPRAM HYDROBROMIDE 20 MG PO TABS: 20.0000 mg | ORAL_TABLET | Freq: Every day | ORAL | 1 refills | Status: AC

## 2024-06-21 MED ORDER — CLONAZEPAM 1 MG PO TABS: ORAL_TABLET | ORAL | 5 refills | Status: AC

## 2024-06-21 NOTE — Progress Notes (Signed)
 "   Patient ID: [name] [name], female    DOB: 11-Jul-1970, 54 y.o.   MRN: [id number]   Assessment & Plan:  GAD (generalized anxiety disorder) -     Citalopram  Hydrobromide; Take 1 tablet (20 mg total) by mouth daily.  Dispense: 90 tablet; Refill: 1 -     clonazePAM ; TAKE 1/2 TABLET(0.5 MG) BY MOUTH TWICE DAILY AS NEEDED FOR ANXIETY  Dispense: 30 tablet; Refill: 5     Assessment & Plan Generalized anxiety disorder Well-managed with current medication regimen. Anxiety symptoms are less severe than previously reported. - Continue Celexa  20 mg daily. - Continue Klonopin  1 mg, half by mouth twice daily as needed for anxiety. - Refilled prescriptions for Celexa  and Klonopin .      Return in about 6 months (around 12/19/2024) for physical, recheck/follow-up, fasting labs .    Subjective:    Chief Complaint  Patient presents with   Medical Management of Chronic Issues    6 month follow up;     HPI Discussed the use of AI scribe software for clinical note transcription with the patient, who gave verbal consent to proceed.  History of Present Illness [name] [name] is a 54 year old female with generalized anxiety disorder who presents for her regular six month follow-up visit.  Her anxiety symptoms have improved compared to previous visits. She continues to manage her symptoms with Klonopin  0.5 mg by mouth twice daily as needed and Celexa  20 mg daily, occasionally taking half doses of both medications.  She has experienced a weight loss of seven pounds since August, which she attributes to increased physical activity and a focus on protein intake. Her diet includes meals like cottage cheese with peaches and blueberries. She is concerned about maintaining her weight loss during the upcoming tax season due to increased stress and the availability of unhealthy foods.  Her mother, aged 27, is experiencing a decline in physical abilities but remains mentally sharp.     Past  Medical History:  Diagnosis Date   Anxiety    Depression    GERD (gastroesophageal reflux disease)    History of back surgery 12/18/2018   IBS (irritable bowel syndrome)    Lichen sclerosus     Past Surgical History:  Procedure Laterality Date   AUGMENTATION MAMMAPLASTY Bilateral 03/2017   BACK SURGERY Bilateral 12/18/2018   BILATERAL SALPINGECTOMY Bilateral 10/31/2012   Procedure: BILATERAL SALPINGECTOMY;  Surgeon: Ronal Elvie Pinal, MD;  Location: [facility];  Service: Gynecology;  Laterality: Bilateral;   BREAST ENHANCEMENT SURGERY  04/12/2017   CESAREAN SECTION     CYSTO WITH HYDRODISTENSION N/A 10/31/2012   Procedure: CYSTOSCOPY/HYDRODISTENSION;  Surgeon: Glendia DELENA Elizabeth, MD;  Location: [facility];  Service: Urology;  Laterality: N/A;   DILATION AND CURETTAGE OF UTERUS     DILITATION & CURRETTAGE/HYSTROSCOPY WITH NOVASURE ABLATION N/A 10/31/2012   Procedure: DILATATION & CURETTAGE/HYSTEROSCOPY WITH NOVASURE ABLATION;  Surgeon: Ronal Elvie Pinal, MD;  Location: [facility];  Service: Gynecology;  Laterality: N/A;  Dr Rey Diarmid to start case with a cysto at beginning of case while patient under anesthesia.   ENDOMETRIAL BIOPSY  09/19/12   neg   LAPAROSCOPY Bilateral 10/31/2012   Procedure: LAPAROSCOPY OPERATIVE;  Surgeon: Ronal Elvie Pinal, MD;  Location: [facility];  Service: Gynecology;  Laterality: Bilateral;   LEFT HEART CATH AND CORONARY ANGIOGRAPHY N/A 04/28/2021   Procedure: LEFT HEART CATH AND CORONARY ANGIOGRAPHY;  Surgeon: Jordan, Peter M, MD;  Location: [facility] CV [facility];  Service:  Cardiovascular;  Laterality: N/A;    Family History  Problem Relation Age of Onset   Hypertension Mother    Stroke Father    Non-Hodgkin's lymphoma Father    Hypertension Father    Kidney disease Father    Club foot Son    Breast cancer Neg Hx     Social History[1]   Allergies[2]  Review of Systems NEGATIVE UNLESS OTHERWISE INDICATED IN HPI      Objective:     BP 108/68 (BP Location:  Right Arm, Patient Position: Sitting, Cuff Size: Normal)   Pulse 75   Temp 98.1 F (36.7 C) (Temporal)   Ht 5' 3.39 (1.61 m)   Wt 126 lb (57.2 kg)   LMP  (LMP Unknown)   SpO2 95%   BMI 22.05 kg/m   Wt Readings from Last 3 Encounters:  06/21/24 126 lb (57.2 kg)  01/11/24 133 lb 9.6 oz (60.6 kg)  12/20/23 135 lb 6.4 oz (61.4 kg)    BP Readings from Last 3 Encounters:  06/21/24 108/68  01/11/24 101/67  12/20/23 98/72     Physical Exam Vitals and nursing note reviewed.  Constitutional:      General: She is not in acute distress.    Appearance: Normal appearance. She is well-developed. She is not ill-appearing.  HENT:     Head: Normocephalic and atraumatic.  Cardiovascular:     Rate and Rhythm: Normal rate and regular rhythm.     Pulses: Normal pulses.     Heart sounds: Normal heart sounds.  Pulmonary:     Effort: Pulmonary effort is normal.     Breath sounds: Normal breath sounds.  Skin:    General: Skin is warm and dry.  Neurological:     General: No focal deficit present.     Mental Status: She is alert.  Psychiatric:        Mood and Affect: Mood and affect normal.             Marlane Hirschmann M York Valliant, PA-C     [1]  Social History Tobacco Use   Smoking status: Former    Current packs/day: 0.00    Types: Cigarettes    Quit date: 01/15/2013    Years since quitting: 11.4   Smokeless tobacco: Never  Vaping Use   Vaping status: Former   Quit date: 07/18/2018  Substance Use Topics   Alcohol use: Yes    Alcohol/week: 5.0 standard drinks of alcohol    Types: 5 Standard drinks or equivalent per week   Drug use: Yes    Types: Barbituates  [2]  Allergies Allergen Reactions   Ciprofloxacin Nausea Only   "

## 2024-12-20 ENCOUNTER — Ambulatory Visit (HOSPITAL_BASED_OUTPATIENT_CLINIC_OR_DEPARTMENT_OTHER): Admitting: Obstetrics & Gynecology

## 2024-12-25 ENCOUNTER — Encounter: Admitting: Physician Assistant
